# Patient Record
Sex: Female | Born: 1935 | Race: White | Hispanic: No | Smoking: Never smoker
Health system: Southern US, Community
[De-identification: ages and names within clinical notes are randomized; demographics above are authoritative.]

## PROBLEM LIST (undated history)

## (undated) DIAGNOSIS — M810 Age-related osteoporosis without current pathological fracture: Secondary | ICD-10-CM

## (undated) DIAGNOSIS — H409 Unspecified glaucoma: Secondary | ICD-10-CM

## (undated) DIAGNOSIS — I10 Essential (primary) hypertension: Secondary | ICD-10-CM

## (undated) DIAGNOSIS — F419 Anxiety disorder, unspecified: Secondary | ICD-10-CM

## (undated) DIAGNOSIS — I2510 Atherosclerotic heart disease of native coronary artery without angina pectoris: Secondary | ICD-10-CM

## (undated) DIAGNOSIS — I509 Heart failure, unspecified: Secondary | ICD-10-CM

## (undated) HISTORY — PX: CARDIAC SURGERY: SHX584

## (undated) HISTORY — PX: CORONARY ARTERY BYPASS GRAFT: SHX141

---

## 2002-11-13 ENCOUNTER — Encounter: Payer: Self-pay | Admitting: Thoracic Surgery (Cardiothoracic Vascular Surgery)

## 2002-11-13 ENCOUNTER — Inpatient Hospital Stay (HOSPITAL_COMMUNITY)
Admission: EM | Admit: 2002-11-13 | Discharge: 2002-11-20 | Payer: Self-pay | Admitting: Thoracic Surgery (Cardiothoracic Vascular Surgery)

## 2002-11-14 ENCOUNTER — Encounter: Payer: Self-pay | Admitting: Thoracic Surgery (Cardiothoracic Vascular Surgery)

## 2002-11-15 ENCOUNTER — Encounter: Payer: Self-pay | Admitting: Cardiothoracic Surgery

## 2002-11-16 ENCOUNTER — Encounter: Payer: Self-pay | Admitting: Thoracic Surgery (Cardiothoracic Vascular Surgery)

## 2002-11-18 ENCOUNTER — Encounter: Payer: Self-pay | Admitting: Cardiothoracic Surgery

## 2003-09-22 ENCOUNTER — Other Ambulatory Visit: Payer: Self-pay

## 2003-09-23 ENCOUNTER — Other Ambulatory Visit: Payer: Self-pay

## 2004-03-19 ENCOUNTER — Other Ambulatory Visit: Payer: Self-pay

## 2004-03-20 ENCOUNTER — Other Ambulatory Visit: Payer: Self-pay

## 2004-05-17 ENCOUNTER — Ambulatory Visit: Payer: Self-pay | Admitting: Specialist

## 2004-07-23 ENCOUNTER — Inpatient Hospital Stay: Payer: Self-pay | Admitting: Internal Medicine

## 2004-08-03 ENCOUNTER — Emergency Department: Payer: Self-pay | Admitting: Emergency Medicine

## 2004-10-05 ENCOUNTER — Ambulatory Visit: Payer: Self-pay

## 2005-09-10 ENCOUNTER — Other Ambulatory Visit: Payer: Self-pay

## 2005-09-10 ENCOUNTER — Emergency Department: Payer: Self-pay | Admitting: Emergency Medicine

## 2005-10-03 ENCOUNTER — Ambulatory Visit: Payer: Self-pay | Admitting: Gastroenterology

## 2005-10-18 ENCOUNTER — Ambulatory Visit: Payer: Self-pay | Admitting: Gastroenterology

## 2005-12-04 ENCOUNTER — Ambulatory Visit: Payer: Self-pay

## 2006-03-21 ENCOUNTER — Ambulatory Visit: Payer: Self-pay | Admitting: Internal Medicine

## 2006-11-16 ENCOUNTER — Emergency Department: Payer: Self-pay | Admitting: Emergency Medicine

## 2006-12-11 ENCOUNTER — Ambulatory Visit: Payer: Self-pay | Admitting: Internal Medicine

## 2007-03-27 ENCOUNTER — Ambulatory Visit: Payer: Self-pay | Admitting: Family Medicine

## 2008-01-03 ENCOUNTER — Ambulatory Visit: Payer: Self-pay | Admitting: Internal Medicine

## 2008-07-26 ENCOUNTER — Emergency Department: Payer: Self-pay | Admitting: Emergency Medicine

## 2008-08-22 ENCOUNTER — Inpatient Hospital Stay: Payer: Self-pay | Admitting: Internal Medicine

## 2008-11-06 ENCOUNTER — Ambulatory Visit: Payer: Self-pay | Admitting: Internal Medicine

## 2009-01-04 ENCOUNTER — Ambulatory Visit: Payer: Self-pay | Admitting: Internal Medicine

## 2009-05-27 ENCOUNTER — Emergency Department: Payer: Self-pay | Admitting: Emergency Medicine

## 2009-09-16 ENCOUNTER — Ambulatory Visit: Payer: Self-pay | Admitting: Gastroenterology

## 2010-01-13 ENCOUNTER — Ambulatory Visit: Payer: Self-pay | Admitting: Internal Medicine

## 2010-03-29 ENCOUNTER — Ambulatory Visit: Payer: Self-pay | Admitting: Gastroenterology

## 2010-04-18 ENCOUNTER — Ambulatory Visit: Payer: Self-pay | Admitting: Internal Medicine

## 2010-07-15 ENCOUNTER — Emergency Department: Payer: Self-pay | Admitting: Unknown Physician Specialty

## 2010-08-08 ENCOUNTER — Emergency Department: Payer: Self-pay | Admitting: Emergency Medicine

## 2010-08-09 NOTE — Assessment & Plan Note (Signed)
Summary: flu shot/jbb  Nurse Visit   Immunizations Administered:  Influenza Vaccine:    Vaccine Type: FLULAVAL    Site: left deltoid    Mfr: GlaxoSmithKline    Dose: 0.5 ml    Route: IM    Given by: Levonne Spiller EMT-P    Exp. Date: 12/08/2010    Lot #: [id number]    VIS given: 02/01/10 version given April 18, 2010.  Orders Added: 1)  Flulaval Injection 3 yrs >IM [Q2036]   Immunizations Administered:  Influenza Vaccine:    Vaccine Type: FLULAVAL    Site: left deltoid    Mfr: GlaxoSmithKline    Dose: 0.5 ml    Route: IM    Given by: Levonne Spiller EMT-P    Exp. Date: 12/08/2010    Lot #: [id number]    VIS given: 02/01/10 version given April 18, 2010.  Flu Vaccine Consent Questions:    Do you have a history of severe allergic reactions to this vaccine? no    Any prior history of allergic reactions to egg and/or gelatin? no    Do you have a sensitivity to the preservative Thimersol? no    Do you have a past history of Guillan-Barre Syndrome? no    Do you currently have an acute febrile illness? no    Have you ever had a severe reaction to latex? no    Vaccine information given and explained to patient? yes    Are you currently pregnant? no

## 2010-11-25 NOTE — Discharge Summary (Signed)
NAME:  Anita Ward, Anita Ward                          ACCOUNT NO.:  [id number]   MEDICAL RECORD NO.:  [id number]                   PATIENT TYPE:  INP   LOCATION:  2015                                 FACILITY:  [facility]   PHYSICIAN:  Anita Ward, M.D.              DATE OF BIRTH:  1920-02-10   DATE OF ADMISSION:  11/13/2002  DATE OF DISCHARGE:                           DISCHARGE SUMMARY - REFERRING   DATE OF ANTICIPATED DISCHARGE:  11/20/2002   PRIMARY ADMITTING DIAGNOSIS:  Coronary artery disease.   ADDITIONAL/DISCHARGE DIAGNOSES:  1. Severe coronary artery disease.  2. Class III progressive angina.  3. Hypertension.  4. Severe osteoporosis and osteoarthritis.  5. Gastroesophageal reflux disease.   PROCEDURES PERFORMED:  1. Off pump coronary artery bypass grafting x 2 (left internal mammary     artery to the LAD, saphenous vein graft to the diagonal).  2. Endoscopic vein harvest, right thigh.   HISTORY:  The patient is an 77 year old white female who over the past  several months has a history of chest tightness as well as shortness of  breath with exertion. Her symptoms have been occurring more frequently and  with more severity.  Since her symptoms have worsened, she saw Dr. America Brown  nurse practitioner, and was felt to warrant cardiac catheterization. This  was performed on 11/13/02, and she was found to have significant LAD disease,  specifically 90% mid stenosis of the LAD as well as a 70% stenosis of the  diagonal.  She is not felt to be a good candidate for percutaneous  intervention.  She was, therefore, transferred to [facility] under  the care of Dr. Cornelius Ward for consideration of surgical revascularization.   HOSPITAL COURSE:  She was admitted on 11/13/02. She was seen by Dr. Cornelius Ward, and  her cardiac catheterization films were evaluated. It is felt that she would  benefit from coronary artery bypass surgery. After the risks, benefits and  alternatives were explained to  the patient, she consented to proceed. She  was taken to the operating room on 11/14/02 and underwent off pump coronary  artery bypass grafting x 2 as described above.  She tolerated the procedure  well and was transferred to the SICU in stable condition.  She was extubated  shortly after surgery. She was hemodynamically stable and doing well on  postop day one. She was kept in the ICU for overnight observation.  Initially she was somewhat confused; however, her narcotic medications were  minimized, and she returned to her baseline mental status.  By late in the  day postop day two, she was being readied for transfer to the floor.  Her  blood pressure started to trend back upward late in the day postop day  three, and she was restarted on all of her home antihypertensive  medications.  At the present, she is maintaining blood pressures of between  110 and 120 systolic.  She  has also been somewhat volume overloaded and has  been diuresed back down to 4-5 pounds above her preoperative weight.   She initially was quite hypokalemic; however, her potassium has been  replaced, and this morning her potassium was 4.0. Her other labs have  remained stable. Her BUN is 9, creatinine 0.8. Hemoglobin 9.4, hematocrit  27.6. She has been ambulating in the halls per cardiac rehab phase I and is  making good progress.  She is maintained on sinus rhythm. Her surgical  incision sites are healing well. She is tolerating a regular diet and is  having normal bowel and bladder function.  It is felt that if she continues  to remain stable, she will be ready for discharge on 11/20/02. Because the  patient lives with her grandson, who is not available to care for her during  the day because of work issues, and because of her still somewhat  deconditioned state, it is felt that a short placement at a rehab facility  is indicated prior to discharge home. This has been discussed with the case  manager, and the patient  agrees to be transferred on 5/13 to an assisted  living facility.   DISCHARGE MEDICATIONS:  1. Enteric coated aspirin 325 mg daily.  2. Hyzaar 100/25 daily.  3. Norvasc 10 mg daily.  4. Toprol-XL 50 mg daily.  5. Clonidine HL 0.2 mg bid.  6. Nexium 40 mg daily.  7. Calcium 500 mg bid.  8. Vitamin E 400 IU daily.  9. Centrum silver daily.  10.      Miacalcin nasal spray as directed.  11.      Ultram 50-100 mg q.4h. prn for pain.   DISCHARGE INSTRUCTIONS:  She is to refrain from driving, heavy lifting, or  strenuous activity.  She may continue daily walking and use of her incentive  spirometry. She is asked to continue a low fat and low sodium diet.  She may  shower daily and clean her incisions with soap and water.   DISCHARGE FOLLOW UP:  She is asked to call and schedule an appointment to  see Dr. Lady Gary in 2 weeks.  She will then follow up with Dr. Cornelius Ward on Monday,  June 7th, at 1:15 p.m. She will have a chest x-ray at [facility] 1 hour prior to this appointment and is asked to bring her films to  the __________  office for his review.  Our office should be contacted  immediately if she experiences any problems such as redness, swelling or  drainage of the incision site, fever greater than 101, new onset of chest  pain or shortness of breath.     Coral Ceo, P.A.                        Anita Ward, M.D.    GC/MEDQ  D:  11/19/2002  T:  11/19/2002  Job:  [id number]   cc:   [name]  [address]  [place]  [place] [address]  Fax: [phone]   Harold Hedge, M.D.  [place]   Arnoldo Hooker, M.D.  [employer]

## 2010-11-25 NOTE — H&P (Signed)
NAME:  Anita Ward, Anita Ward                          ACCOUNT NO.:  [id number]   MEDICAL RECORD NO.:  [id number]                   PATIENT TYPE:  INP   LOCATION:  2901                                 FACILITY:  [facility]   PHYSICIAN:  Salvatore Decent. Cornelius Moras, M.D.              DATE OF BIRTH:  1919/12/23   DATE OF ADMISSION:  11/13/2002  DATE OF DISCHARGE:                                HISTORY & PHYSICAL   CHIEF COMPLAINT:  Tightness in my chest when walking.   HISTORY OF PRESENT ILLNESS:  The patient is an 77 year old white female with  a several month history of chest tightness and shortness of breath on  exertion.  The patient is usually very active for her age, walking on the  treadmill several times a week as well as being active in her church and  gardening.  However, her symptoms have worsened and become more frequent.  Now she states that the symptoms will occur with minimal activity such as  working in her garden or walking only a few steps.  She, in the past week,  developed symptoms both while at church singing in the choir and while at  the mall prompting her to stop and rest.  She also experiences numbness in  her upper extremities bilaterally. She denies diaphoresis, nausea or  vomiting.  She regularly sees the nurse practitioner at Dr. Harold Hedge  office for follow-up of her hypertension and because her symptoms have  worsened, she became concerned and sought medical attention at their office.  It was felt that her symptoms would warrant cardiac catheterization.  This  was performed today at [facility] by Dr. Arnoldo Hooker.  Her catheterization showed an ejection fraction of 60% with 80%  stenosis of the ostial right coronary artery, 60% proximal right coronary  artery, 70% mid right coronary artery, 90% mid LAD and 70% third diagonal  stenoses.  She was not felt to be a good candidate for percutaneous  intervention, therefore she was transferred to [facility]  [facility] under the care of Dr. Cornelius Moras for evaluation for possible coronary  artery bypass surgery.   PAST MEDICAL HISTORY:  1. Hypertension.  2. Severe osteoporosis and osteoarthritis.  3. Gastroesophageal reflux disease.  4. Recent bronchitis and bronchial asthma.  5. She denies history of hyperlipidemia, COPD, CVA, cancer, diabetes     mellitus.   PAST SURGICAL HISTORY:  1. Hemorrhoidectomy.  2. Appendectomy.  3. Laparoscopic cholecystectomy.  4. Partial hysterectomy.  5. Removal of left heel spur.  6. Right cataract removal.   CURRENT MEDICATIONS:  1. Hyzaar 100/25 daily.  2. Norvasc 10 mg daily.  3. Toprol XL 50 mg daily.  4. Clonidine HL 0.2 mg b.i.d.  5. Enteric coated aspirin 81 mg daily.  6. Nexium 40 mg daily.  7. Calcium 500 mg b.i.d.  8. Vitamin E 400 internal units daily.  9. Centrum Silver one daily.  10.      Flovent 110 mcg daily.  11.      Albuterol two puffs q.i.d.  12.      Miacalcin nasal spray daily.   ALLERGIES:  SULFA but the patient does not recall what type of reaction she  had.   FAMILY HISTORY:  Her father died at age 34 with congestive heart failure.  Her mother died at a young age of an unknown cause.  She had one brother who  died at age 69 of a myocardial infarction.   SOCIAL HISTORY:  She is widowed and lives in [place], [place],  with her grandson.  She has two sons.  She has been very active up to this  point as previously mentioned in the history of present illness.  She denies  any prior or current history of alcohol or tobacco use.   REVIEW OF SYSTEMS:  See history of present illness for pertinent positives  and negatives.  Also, she has history of gastroesophageal reflux disease for  which she takes Nexium. This seems to control her symptoms very well.  She  recently has had several intermittent episodes of head pain which is not  associated with other symptoms and resolved without treatment.  She also had   recent episode of bronchitis which was treated with four rounds of  antibiotics as well as several inhalers and has presently resolved.  She  denies weight loss, fevers, chills, TIA symptoms, amaurosis fugax,  dysphagia, syncope, weakness, palpitations, orthopnea, paroxysmal nocturnal  dyspnea, abdominal pain, nausea, vomiting, diarrhea, constipation,  hematuria, dysuria, nocturia, hematochezia, melena, lower extremity edema,  claudication, anxiety, depression, intolerance to heat or cold.   PHYSICAL EXAMINATION:  GENERAL APPEARANCE:  This is an elderly white female  in no acute distress.  She is very bright, alert and articulate and is a  very good historian for her age.  VITAL SIGNS:  Blood pressure 175/64, pulse 71 and regular, respiratory rate  20 and unlabored, temperature 98.3.  HEENT:  Normocephalic and atraumatic.  Pupils are equal, round, reactive to  light and accommodation.  Extraocular movements intact.  Examination of the  external ears and nose reveals no abnormality.  Oropharynx is clear and  dentition is in good repair.  NECK:  Supple without lymphadenopathy or thyromegaly.  No carotid bruits.  LUNGS:  Clear to auscultation.  CARDIOVASCULAR:  Regular rate and rhythm without murmurs, rubs, or gallops.  ABDOMEN:  Soft, nontender and nondistended with active bowel sounds in all  quadrants.  No masses or hepatosplenomegaly.  She has several well healed  surgical scars around her abdomen.  EXTREMITIES:  No clubbing, cyanosis, or edema.  Her right groin puncture  site from her catheterization today is dry without hematoma.  She has 2+  bilateral femoral pulses as well as 2+ right dorsal pedis and posterior  tibial pulses and 1+ left dorsalis pedis and posterior tibial pulses.  Saphenous veins are intact.  NEUROLOGIC:  Cranial nerves II-XII grossly intact.  She is alert and  oriented x3.  She moves all extremities without difficulty.  Gait is not tested due to her recent  catheterization.   ASSESSMENT AND PLAN:  This is an 77 year old white female with severe two  vessel coronary artery disease who is not felt to be a good candidate for  percutaneous intervention.  She is admitted in transfer from [facility].  She will continue nitroglycerin as well as her  current medications.  Dr. Cornelius Moras will see the patient this afternoon and she  will most likely be scheduled for coronary artery bypass grafting in the  morning, Nov 14, 2002.     Coral Ceo, P.A.                        Salvatore Decent. Cornelius Moras, M.D.    GC/MEDQ  D:  11/13/2002  T:  11/14/2002  Job:  [id number]   cc:   Harold Hedge, M.D.  [place], [place]   [employer]  [address]  [place]  [place] [address]  Fax: [phone]   Arnoldo Hooker, M.D.

## 2010-11-25 NOTE — Op Note (Signed)
NAME:  Anita Ward, Anita Ward                          ACCOUNT NO.:  [id number]   MEDICAL RECORD NO.:  [id number]                   PATIENT TYPE:  INP   LOCATION:  2307                                 FACILITY:  [facility]   PHYSICIAN:  Salvatore Decent. Cornelius Moras, M.D.              DATE OF BIRTH:  Aug 22, 1919   DATE OF PROCEDURE:  11/14/2002  DATE OF DISCHARGE:                                 OPERATIVE REPORT   PREOPERATIVE DIAGNOSIS:  Severe one-vessel coronary artery disease with  class III progressive angina.   POSTOPERATIVE DIAGNOSIS:  Severe one-vessel coronary artery disease with  class III progressive angina.   PROCEDURE:  Median sternotomy for off-pump coronary artery bypass grafting  x2 (left internal mammary artery to distal left anterior descending coronary  artery, saphenous vein graft to first diagonal branch), with endoscopic vein  harvest.   SURGEON:  Salvatore Decent. Cornelius Moras, M.D.   ASSISTANTS:  Salvatore Decent. Dorris Fetch, M.D., and Coral Ceo, P.A.   ANESTHESIA:  General.   BRIEF CLINICAL NOTE:  The patient is an 77 year old female followed by Dr.  Loma Ward with no previous cardiac history but a history of  hypertension.  She presents with classical symptoms of exertional angina,  which have progressed somewhat rapidly over the last few weeks.  Cardiac  catheterization performed by Dr. Gwen Pounds demonstrates severe single-vessel  coronary artery disease, including high-grade stenosis of the left anterior  descending coronary artery with coronary anatomy not favorable for  percutaneous coronary intervention.  Left ventricular function is preserved.   OPERATIVE CONSENT:  The patient and her family have been counseled at length  regarding the indications and potential benefits of coronary artery bypass  grafting.  Alternative treatment strategies have been discussed.  They  understand and accept all associated risks of surgery, including but not  limited to risk of death, stroke,  myocardial infarction, congestive heart  failure, pneumonia, respiratory failure, bleeding requiring blood  transfusion, arrhythmia, infection, and recurrent coronary artery disease.  They understand the tentative plan for an attempt at coronary bypass  grafting without use of the heart-lung machine.  They also understand that  this may or may not be technically feasible and that conventional bypass  surgery may be required.  All of their questions have been addressed.   DESCRIPTION OF PROCEDURE:  The patient is brought to the operating room on  the above-mentioned date and central monitoring is established by the  anesthesia service.  Specifically, a Swan-Ganz catheter is placed through  the right internal jugular approach.  A radial arterial line is placed.  Intravenous antibiotics are administered.  Following induction with general  endotracheal anesthesia, a Foley catheter is placed.  The patient's chest,  abdomen, both groins, and both lower extremities are prepared and draped in  a sterile manner.   A median sternotomy incision is performed and the left internal mammary  artery is dissected from the chest wall  and prepared for bypass grafting.  The left internal mammary artery is very small-caliber but otherwise good  quality and has excellent forward flow.  Simultaneously saphenous vein is  obtained from the patient's right thigh using endoscopic vein harvest  technique.  The saphenous vein is good-quality conduit.  The patient is  heparinized systemically.   The pericardium is opened.  The heart is inspected and the distal sites are  selected for coronary bypass grafting.  The saphenous vein and left internal  mammary artery conduits are both trimmed to appropriate lengths.  The  Guidant stabilization system is utilized to facilitate off-pump coronary  artery bypass grafting.  This includes suction stabilization for the apex of  the left ventricle as well as a stabilization arm  around the target vessel.  Hemostasis is achieved using a single elastic vessel loop placed around the  proximal coronary vessels for both distal anastomoses.  Distal occlusion is  not required.  Intracoronary shunts are not necessary.  The following distal  coronary anastomoses are performed:  (1) The first diagonal branch off the  left anterior descending coronary artery is grafted with a saphenous vein  graft in an end-to-side fashion.  This coronary measures 1.3 mm in diameter  and is of good quality at the site of distal bypass.  After completion of  this anastomosis, the patient developed ST segment elevation on her baseline  electrocardiogram.  The heart was then allowed to return to normal position  and the nitroglycerin drip was increased.  The proximal saphenous vein  anastomosis is performed directly to the ascending aorta under a partial  occlusion clamp.  The ST segments return to normal baseline promptly.  (2)  The distal left anterior descending coronary artery is grafted with the left  internal mammary artery in an end-to-side fashion.  This coronary measures  1.5 mm in diameter and is of good quality at the site of distal bypass.  After completion of both bypass grafts, flow is verified through each graft  using sterile ultrasonic Doppler flow probe.  Hemostasis is ascertained.  Heparin is reversed with protamine.   Epicardial pacing wires are affixed to the right ventricular outflow tract  and to the right atrial appendage.  The mediastinum and the left chest are  irrigated with saline solution containing vancomycin.  Meticulous surgical  hemostasis is ascertained.  The mediastinum and the left chest are drained  with three chest tubes placed through separate stab incisions inferiorly.  The median sternotomy is closed in a routine fashion.  The right lower  extremity incision is closed in multiple layers in routine fashion.  All skin incisions are closed with subcuticular  skin closures.   The patient tolerated the procedure well and was transported to the surgical  intensive care unit in stable condition.  There are no intraoperative  complications.  All sponge, instrument, and needle counts are verified  correct at the completion of the operation.  No blood products were  administered.                                               Salvatore Decent. Cornelius Moras, M.D.    CHO/MEDQ  D:  11/14/2002  T:  11/17/2002  Job:  [id number]   cc:   Arnoldo Hooker, M.D.   Mariel Kansky, M.D.   Anita Ward, M.D.

## 2011-01-19 ENCOUNTER — Observation Stay: Payer: Self-pay | Admitting: Internal Medicine

## 2011-05-23 ENCOUNTER — Ambulatory Visit: Payer: Self-pay | Admitting: Physician Assistant

## 2011-05-29 ENCOUNTER — Ambulatory Visit: Payer: Self-pay | Admitting: Physician Assistant

## 2011-06-15 ENCOUNTER — Emergency Department: Payer: Self-pay | Admitting: Emergency Medicine

## 2011-06-16 ENCOUNTER — Emergency Department: Payer: Self-pay | Admitting: Emergency Medicine

## 2011-06-20 ENCOUNTER — Ambulatory Visit: Payer: Self-pay | Admitting: Orthopedic Surgery

## 2011-09-12 LAB — CBC
HGB: 11.4 g/dL — ABNORMAL LOW (ref 12.0–16.0)
MCH: 27.8 pg (ref 26.0–34.0)
MCHC: 34.5 g/dL (ref 32.0–36.0)
MCV: 80 fL (ref 80–100)
Platelet: 280 10*3/uL (ref 150–440)

## 2011-09-12 LAB — COMPREHENSIVE METABOLIC PANEL
Albumin: 3.1 g/dL — ABNORMAL LOW (ref 3.4–5.0)
Alkaline Phosphatase: 59 U/L (ref 50–136)
Anion Gap: 14 (ref 7–16)
BUN: 7 mg/dL (ref 7–18)
Bilirubin,Total: 0.6 mg/dL (ref 0.2–1.0)
Chloride: 82 mmol/L — ABNORMAL LOW (ref 98–107)
Co2: 25 mmol/L (ref 21–32)
Creatinine: 0.62 mg/dL (ref 0.60–1.30)
EGFR (African American): >60
EGFR (Non-African Amer.): >60
Glucose: 109 mg/dL — ABNORMAL HIGH (ref 65–99)
Osmolality: 243 (ref 275–301)
Potassium: 2.9 mmol/L — ABNORMAL LOW (ref 3.5–5.1)
SGPT (ALT): 17 U/L
Sodium: 121 mmol/L — ABNORMAL LOW (ref 136–145)
Total Protein: 7.1 g/dL (ref 6.4–8.2)

## 2011-09-12 LAB — TROPONIN I: Troponin-I: <0.02 ng/mL

## 2011-09-13 ENCOUNTER — Inpatient Hospital Stay: Payer: Self-pay | Admitting: Internal Medicine

## 2011-09-14 LAB — CBC WITH DIFFERENTIAL/PLATELET
Basophil #: 0 10*3/uL (ref 0.0–0.1)
Eosinophil #: 0 10*3/uL (ref 0.0–0.7)
Lymphocyte %: 10 %
MCH: 27.6 pg (ref 26.0–34.0)
MCHC: 33.5 g/dL (ref 32.0–36.0)
Neutrophil %: 81.6 %
Platelet: 298 10*3/uL (ref 150–440)
RDW: 13.5 % (ref 11.5–14.5)
WBC: 14.8 10*3/uL — ABNORMAL HIGH (ref 3.6–11.0)

## 2011-09-14 LAB — BASIC METABOLIC PANEL
Anion Gap: 8 (ref 7–16)
Calcium, Total: 8.5 mg/dL (ref 8.5–10.1)
Co2: 27 mmol/L (ref 21–32)
Creatinine: 0.71 mg/dL (ref 0.60–1.30)
EGFR (African American): >60
Potassium: 3.2 mmol/L — ABNORMAL LOW (ref 3.5–5.1)

## 2011-09-15 LAB — BASIC METABOLIC PANEL
Anion Gap: 10 (ref 7–16)
BUN: 8 mg/dL (ref 7–18)
Calcium, Total: 8.5 mg/dL (ref 8.5–10.1)
Creatinine: 0.63 mg/dL (ref 0.60–1.30)
EGFR (African American): >60
EGFR (Non-African Amer.): >60
Glucose: 100 mg/dL — ABNORMAL HIGH (ref 65–99)
Sodium: 136 mmol/L (ref 136–145)

## 2011-09-15 LAB — CBC WITH DIFFERENTIAL/PLATELET
Eosinophil #: 0 10*3/uL (ref 0.0–0.7)
Eosinophil %: 0 %
HCT: 33.4 % — ABNORMAL LOW (ref 35.0–47.0)
Lymphocyte #: 1.2 10*3/uL (ref 1.0–3.6)
MCH: 27.2 pg (ref 26.0–34.0)
MCV: 82 fL (ref 80–100)
Monocyte #: 1 10*3/uL — ABNORMAL HIGH (ref 0.0–0.7)
Monocyte %: 7.1 %
Neutrophil #: 12.3 10*3/uL — ABNORMAL HIGH (ref 1.4–6.5)
Neutrophil %: 84.2 %
Platelet: 289 10*3/uL (ref 150–440)
RBC: 4.08 10*6/uL (ref 3.80–5.20)
RDW: 13.4 % (ref 11.5–14.5)
WBC: 14.6 10*3/uL — ABNORMAL HIGH (ref 3.6–11.0)

## 2011-09-16 LAB — BASIC METABOLIC PANEL
BUN: 9 mg/dL (ref 7–18)
Calcium, Total: 8.5 mg/dL (ref 8.5–10.1)
Chloride: 99 mmol/L (ref 98–107)
Co2: 27 mmol/L (ref 21–32)
Potassium: 4.4 mmol/L (ref 3.5–5.1)
Sodium: 136 mmol/L (ref 136–145)

## 2011-09-16 LAB — CBC WITH DIFFERENTIAL/PLATELET
Basophil %: 0 %
Eosinophil %: 0.1 %
Lymphocyte #: 1.5 10*3/uL (ref 1.0–3.6)
MCH: 27.4 pg (ref 26.0–34.0)
MCHC: 33.6 g/dL (ref 32.0–36.0)
MCV: 82 fL (ref 80–100)
Monocyte #: 1.1 10*3/uL — ABNORMAL HIGH (ref 0.0–0.7)
Monocyte %: 8.3 %
Neutrophil #: 10.6 10*3/uL — ABNORMAL HIGH (ref 1.4–6.5)
Neutrophil %: 80.3 %
RBC: 4.13 10*6/uL (ref 3.80–5.20)

## 2011-09-17 LAB — BASIC METABOLIC PANEL
Calcium, Total: 9.1 mg/dL (ref 8.5–10.1)
Chloride: 97 mmol/L — ABNORMAL LOW (ref 98–107)
Co2: 28 mmol/L (ref 21–32)
Creatinine: 0.74 mg/dL (ref 0.60–1.30)
Glucose: 87 mg/dL (ref 65–99)
Osmolality: 272 (ref 275–301)
Potassium: 3.9 mmol/L (ref 3.5–5.1)
Sodium: 137 mmol/L (ref 136–145)

## 2011-09-17 LAB — CBC WITH DIFFERENTIAL/PLATELET
Basophil %: 0.2 %
Eosinophil #: 0 10*3/uL (ref 0.0–0.7)
Eosinophil %: 0.2 %
HCT: 35.9 % (ref 35.0–47.0)
HGB: 12 g/dL (ref 12.0–16.0)
Lymphocyte %: 14.3 %
Monocyte %: 9.3 %
Neutrophil %: 76 %
Platelet: 335 10*3/uL (ref 150–440)
RBC: 4.42 10*6/uL (ref 3.80–5.20)
WBC: 12.1 10*3/uL — ABNORMAL HIGH (ref 3.6–11.0)

## 2011-09-18 LAB — CULTURE, BLOOD (SINGLE)

## 2012-05-08 ENCOUNTER — Inpatient Hospital Stay: Payer: Self-pay | Admitting: Internal Medicine

## 2012-05-08 LAB — COMPREHENSIVE METABOLIC PANEL
Alkaline Phosphatase: 83 U/L (ref 50–136)
Calcium, Total: 9.1 mg/dL (ref 8.5–10.1)
Chloride: 71 mmol/L — ABNORMAL LOW (ref 98–107)
Co2: 31 mmol/L (ref 21–32)
EGFR (African American): >60
EGFR (Non-African Amer.): >60
SGOT(AST): 31 U/L (ref 15–37)
SGPT (ALT): 23 U/L (ref 12–78)

## 2012-05-08 LAB — BASIC METABOLIC PANEL
Anion Gap: 11 (ref 7–16)
BUN: 9 mg/dL (ref 7–18)
Co2: 31 mmol/L (ref 21–32)
Creatinine: 0.79 mg/dL (ref 0.60–1.30)
EGFR (African American): >60
EGFR (Non-African Amer.): >60
Glucose: 116 mg/dL — ABNORMAL HIGH (ref 65–99)

## 2012-05-08 LAB — URINALYSIS, COMPLETE
Leukocyte Esterase: NEGATIVE
Nitrite: NEGATIVE
Ph: 7 (ref 4.5–8.0)
Protein: 100
RBC,UR: 1 /HPF (ref 0–5)
WBC UR: 2 /HPF (ref 0–5)

## 2012-05-08 LAB — LIPASE, BLOOD: Lipase: 121 U/L (ref 73–393)

## 2012-05-08 LAB — SODIUM
Sodium: 116 mmol/L — CL (ref 136–145)
Sodium: 117 mmol/L — CL (ref 136–145)

## 2012-05-08 LAB — DIFFERENTIAL
Basophil #: 0.1 10*3/uL (ref 0.0–0.1)
Basophil %: 0.6 %
Lymphocyte #: 2.5 10*3/uL (ref 1.0–3.6)
Monocyte %: 6.4 %
Neutrophil #: 18 10*3/uL — ABNORMAL HIGH (ref 1.4–6.5)

## 2012-05-08 LAB — CBC
HCT: 37.9 % (ref 35.0–47.0)
MCH: 27.7 pg (ref 26.0–34.0)
MCHC: 36 g/dL (ref 32.0–36.0)
MCV: 77 fL — ABNORMAL LOW (ref 80–100)
Platelet: 428 10*3/uL (ref 150–440)
RDW: 14 % (ref 11.5–14.5)
WBC: 22.1 10*3/uL — ABNORMAL HIGH (ref 3.6–11.0)

## 2012-05-08 LAB — OSMOLALITY: Osmolality: 236 mOsm/kg — CL (ref 280–301)

## 2012-05-09 LAB — SODIUM
Sodium: 124 mmol/L — ABNORMAL LOW (ref 136–145)
Sodium: 121 mmol/L — ABNORMAL LOW (ref 136–145)

## 2012-05-09 LAB — CBC WITH DIFFERENTIAL/PLATELET
Basophil %: 0.2 %
Eosinophil %: 0 %
HCT: 36.1 % (ref 35.0–47.0)
HGB: 12.9 g/dL (ref 12.0–16.0)
Lymphocyte #: 1.6 10*3/uL (ref 1.0–3.6)
MCH: 27.1 pg (ref 26.0–34.0)
MCV: 76 fL — ABNORMAL LOW (ref 80–100)
Monocyte #: 1.7 x10 3/mm — ABNORMAL HIGH (ref 0.2–0.9)
Neutrophil #: 18.4 10*3/uL — ABNORMAL HIGH (ref 1.4–6.5)
RBC: 4.77 10*6/uL (ref 3.80–5.20)

## 2012-05-09 LAB — BASIC METABOLIC PANEL
BUN: 6 mg/dL — ABNORMAL LOW (ref 7–18)
Calcium, Total: 7.9 mg/dL — ABNORMAL LOW (ref 8.5–10.1)
Chloride: 79 mmol/L — ABNORMAL LOW (ref 98–107)
Creatinine: 0.66 mg/dL (ref 0.60–1.30)
EGFR (Non-African Amer.): >60
Glucose: 117 mg/dL — ABNORMAL HIGH (ref 65–99)
Potassium: 2.7 mmol/L — ABNORMAL LOW (ref 3.5–5.1)
Sodium: 121 mmol/L — ABNORMAL LOW (ref 136–145)

## 2012-05-09 LAB — POTASSIUM: Potassium: 3.4 mmol/L — ABNORMAL LOW (ref 3.5–5.1)

## 2012-05-09 LAB — PROTEIN ELECTROPHORESIS(ARMC)

## 2012-05-10 LAB — BASIC METABOLIC PANEL
Anion Gap: 9 (ref 7–16)
Calcium, Total: 8.5 mg/dL (ref 8.5–10.1)
Chloride: 90 mmol/L — ABNORMAL LOW (ref 98–107)
Co2: 28 mmol/L (ref 21–32)
Creatinine: 0.74 mg/dL (ref 0.60–1.30)
EGFR (African American): >60
Potassium: 3.4 mmol/L — ABNORMAL LOW (ref 3.5–5.1)
Sodium: 127 mmol/L — ABNORMAL LOW (ref 136–145)

## 2012-05-10 LAB — SODIUM
Sodium: 134 mmol/L — ABNORMAL LOW (ref 136–145)
Sodium: 130 mmol/L — ABNORMAL LOW (ref 136–145)

## 2012-05-10 LAB — UR PROT ELECTROPHORESIS, URINE RANDOM

## 2012-05-10 LAB — CBC WITH DIFFERENTIAL/PLATELET
Basophil #: 0.1 10*3/uL (ref 0.0–0.1)
Basophil %: 0.4 %
Eosinophil #: 0 10*3/uL (ref 0.0–0.7)
Lymphocyte #: 1.6 10*3/uL (ref 1.0–3.6)
Lymphocyte %: 9.5 %
MCH: 27.4 pg (ref 26.0–34.0)
MCHC: 35.8 g/dL (ref 32.0–36.0)
MCV: 77 fL — ABNORMAL LOW (ref 80–100)
Monocyte #: 1.9 x10 3/mm — ABNORMAL HIGH (ref 0.2–0.9)
Neutrophil %: 78.6 %
RDW: 14 % (ref 11.5–14.5)

## 2012-05-10 LAB — URINE CULTURE

## 2012-05-11 LAB — BASIC METABOLIC PANEL
Anion Gap: 8 (ref 7–16)
BUN: 8 mg/dL (ref 7–18)
Calcium, Total: 8.4 mg/dL — ABNORMAL LOW (ref 8.5–10.1)
EGFR (African American): >60
EGFR (Non-African Amer.): >60
Glucose: 86 mg/dL (ref 65–99)
Osmolality: 266 (ref 275–301)

## 2012-05-11 LAB — CBC WITH DIFFERENTIAL/PLATELET
Basophil #: 0.1 10*3/uL (ref 0.0–0.1)
Eosinophil %: 0.2 %
HCT: 32.4 % — ABNORMAL LOW (ref 35.0–47.0)
Lymphocyte #: 1.7 10*3/uL (ref 1.0–3.6)
MCH: 27.6 pg (ref 26.0–34.0)
MCHC: 35.1 g/dL (ref 32.0–36.0)
Monocyte #: 1.4 x10 3/mm — ABNORMAL HIGH (ref 0.2–0.9)
Neutrophil #: 8.8 10*3/uL — ABNORMAL HIGH (ref 1.4–6.5)
Neutrophil %: 74 %
Platelet: 334 10*3/uL (ref 150–440)
WBC: 11.9 10*3/uL — ABNORMAL HIGH (ref 3.6–11.0)

## 2012-05-12 LAB — CBC WITH DIFFERENTIAL/PLATELET
Basophil #: 0.1 10*3/uL (ref 0.0–0.1)
Eosinophil #: 0.1 10*3/uL (ref 0.0–0.7)
Eosinophil %: 0.9 %
Lymphocyte %: 13.4 %
MCH: 26.5 pg (ref 26.0–34.0)
MCHC: 33.1 g/dL (ref 32.0–36.0)
MCV: 80 fL (ref 80–100)
Monocyte #: 1.3 x10 3/mm — ABNORMAL HIGH (ref 0.2–0.9)
Neutrophil %: 74.2 %
Platelet: 345 10*3/uL (ref 150–440)
RBC: 4.22 10*6/uL (ref 3.80–5.20)
RDW: 14.3 % (ref 11.5–14.5)

## 2012-05-12 LAB — BASIC METABOLIC PANEL
Anion Gap: 8 (ref 7–16)
BUN: 10 mg/dL (ref 7–18)
Calcium, Total: 8.6 mg/dL (ref 8.5–10.1)
EGFR (African American): >60
EGFR (Non-African Amer.): >60
Glucose: 86 mg/dL (ref 65–99)
Osmolality: 270 (ref 275–301)
Potassium: 4.3 mmol/L (ref 3.5–5.1)

## 2012-05-13 LAB — CULTURE, BLOOD (SINGLE)

## 2012-05-13 LAB — WBC: WBC: 11.6 10*3/uL — ABNORMAL HIGH (ref 3.6–11.0)

## 2012-05-19 ENCOUNTER — Emergency Department: Payer: Self-pay | Admitting: Emergency Medicine

## 2012-05-19 ENCOUNTER — Inpatient Hospital Stay: Payer: Self-pay | Admitting: Internal Medicine

## 2012-05-19 LAB — TROPONIN I: Troponin-I: <0.02 ng/mL

## 2012-05-19 LAB — URINALYSIS, COMPLETE
Bilirubin,UR: NEGATIVE
Bilirubin,UR: NEGATIVE
Blood: NEGATIVE
Blood: NEGATIVE
Glucose,UR: NEGATIVE mg/dL (ref 0–75)
Ketone: NEGATIVE
Leukocyte Esterase: NEGATIVE
Nitrite: NEGATIVE
Ph: 8 (ref 4.5–8.0)
Ph: 8 (ref 4.5–8.0)
Protein: NEGATIVE
RBC,UR: <1 /HPF (ref 0–5)
RBC,UR: 2 /HPF (ref 0–5)
Specific Gravity: 1.006 (ref 1.003–1.030)
Squamous Epithelial: 2
Squamous Epithelial: 11
WBC UR: <1 /HPF (ref 0–5)

## 2012-05-19 LAB — COMPREHENSIVE METABOLIC PANEL
Alkaline Phosphatase: 78 U/L (ref 50–136)
Anion Gap: 9 (ref 7–16)
BUN: 8 mg/dL (ref 7–18)
Bilirubin,Total: 0.8 mg/dL (ref 0.2–1.0)
Calcium, Total: 9.1 mg/dL (ref 8.5–10.1)
Co2: 29 mmol/L (ref 21–32)
Creatinine: 0.6 mg/dL (ref 0.60–1.30)
EGFR (African American): >60
EGFR (Non-African Amer.): >60
Glucose: 118 mg/dL — ABNORMAL HIGH (ref 65–99)
Osmolality: 219 (ref 275–301)
Potassium: 2.1 mmol/L — CL (ref 3.5–5.1)
Sodium: 108 mmol/L — CL (ref 136–145)
Total Protein: 7.4 g/dL (ref 6.4–8.2)

## 2012-05-19 LAB — BASIC METABOLIC PANEL
Anion Gap: 9 (ref 7–16)
Chloride: 78 mmol/L — ABNORMAL LOW (ref 98–107)
Co2: 28 mmol/L (ref 21–32)
Creatinine: 0.63 mg/dL (ref 0.60–1.30)
EGFR (Non-African Amer.): >60
Glucose: 123 mg/dL — ABNORMAL HIGH (ref 65–99)
Osmolality: 232 (ref 275–301)

## 2012-05-19 LAB — CBC
HGB: 12.1 g/dL (ref 12.0–16.0)
MCH: 27.2 pg (ref 26.0–34.0)
MCHC: 36.1 g/dL — ABNORMAL HIGH (ref 32.0–36.0)
MCV: 76 fL — ABNORMAL LOW (ref 80–100)
RDW: 13.6 % (ref 11.5–14.5)

## 2012-05-20 LAB — URINALYSIS, COMPLETE
Bilirubin,UR: NEGATIVE
Blood: NEGATIVE
Glucose,UR: NEGATIVE mg/dL (ref 0–75)
Ketone: NEGATIVE
Leukocyte Esterase: NEGATIVE
Nitrite: NEGATIVE
Ph: 8 (ref 4.5–8.0)
Squamous Epithelial: <1
WBC UR: <1 /HPF (ref 0–5)

## 2012-05-20 LAB — CBC WITH DIFFERENTIAL/PLATELET
Basophil #: 0 10*3/uL (ref 0.0–0.1)
Eosinophil %: 0.1 %
HCT: 31.7 % — ABNORMAL LOW (ref 35.0–47.0)
HGB: 11.2 g/dL — ABNORMAL LOW (ref 12.0–16.0)
Lymphocyte %: 9.5 %
Monocyte %: 12.3 %
Neutrophil #: 9.6 10*3/uL — ABNORMAL HIGH (ref 1.4–6.5)
Platelet: 374 10*3/uL (ref 150–440)
RBC: 4.11 10*6/uL (ref 3.80–5.20)
RDW: 13.8 % (ref 11.5–14.5)
WBC: 12.3 10*3/uL — ABNORMAL HIGH (ref 3.6–11.0)

## 2012-05-20 LAB — BASIC METABOLIC PANEL
BUN: 9 mg/dL (ref 7–18)
Calcium, Total: 8.7 mg/dL (ref 8.5–10.1)
Chloride: 92 mmol/L — ABNORMAL LOW (ref 98–107)
Creatinine: 0.71 mg/dL (ref 0.60–1.30)
EGFR (Non-African Amer.): >60
Potassium: 3.1 mmol/L — ABNORMAL LOW (ref 3.5–5.1)
Sodium: 128 mmol/L — ABNORMAL LOW (ref 136–145)

## 2012-05-21 LAB — BASIC METABOLIC PANEL
Anion Gap: 7 (ref 7–16)
Calcium, Total: 8.3 mg/dL — ABNORMAL LOW (ref 8.5–10.1)
Chloride: 105 mmol/L (ref 98–107)
Co2: 24 mmol/L (ref 21–32)
EGFR (African American): >60
Osmolality: 271 (ref 275–301)
Potassium: 4.1 mmol/L (ref 3.5–5.1)

## 2012-05-21 LAB — CBC WITH DIFFERENTIAL/PLATELET
Basophil #: 0 10*3/uL (ref 0.0–0.1)
Basophil %: 0.4 %
Eosinophil #: 0.1 10*3/uL (ref 0.0–0.7)
HGB: 10.1 g/dL — ABNORMAL LOW (ref 12.0–16.0)
Lymphocyte #: 1.3 10*3/uL (ref 1.0–3.6)
Lymphocyte %: 13.2 %
MCH: 28.1 pg (ref 26.0–34.0)
MCHC: 35.5 g/dL (ref 32.0–36.0)
Monocyte #: 1 x10 3/mm — ABNORMAL HIGH (ref 0.2–0.9)
Monocyte %: 10.5 %
Neutrophil #: 7.2 10*3/uL — ABNORMAL HIGH (ref 1.4–6.5)
Platelet: 362 10*3/uL (ref 150–440)
RDW: 13.9 % (ref 11.5–14.5)
WBC: 9.5 10*3/uL (ref 3.6–11.0)

## 2012-05-26 ENCOUNTER — Emergency Department: Payer: Self-pay | Admitting: Emergency Medicine

## 2012-05-26 LAB — CK TOTAL AND CKMB (NOT AT ARMC): CK, Total: 63 U/L (ref 21–215)

## 2012-05-26 LAB — CBC
HGB: 11.7 g/dL — ABNORMAL LOW (ref 12.0–16.0)
MCH: 27.8 pg (ref 26.0–34.0)
MCHC: 36 g/dL (ref 32.0–36.0)
Platelet: 417 10*3/uL (ref 150–440)
RBC: 4.2 10*6/uL (ref 3.80–5.20)
RDW: 13.7 % (ref 11.5–14.5)
WBC: 11 10*3/uL (ref 3.6–11.0)

## 2012-05-26 LAB — TROPONIN I: Troponin-I: <0.02 ng/mL

## 2012-05-26 LAB — URINALYSIS, COMPLETE
Blood: NEGATIVE
Glucose,UR: NEGATIVE mg/dL (ref 0–75)
Ketone: NEGATIVE
Leukocyte Esterase: NEGATIVE
Nitrite: NEGATIVE
Ph: 8 (ref 4.5–8.0)
Protein: NEGATIVE
RBC,UR: <1 /HPF (ref 0–5)
Specific Gravity: 1.001 (ref 1.003–1.030)
WBC UR: 1 /HPF (ref 0–5)

## 2012-05-26 LAB — COMPREHENSIVE METABOLIC PANEL
Albumin: 3.5 g/dL (ref 3.4–5.0)
Anion Gap: 4 — ABNORMAL LOW (ref 7–16)
BUN: 11 mg/dL (ref 7–18)
Bilirubin,Total: 0.4 mg/dL (ref 0.2–1.0)
Calcium, Total: 9.1 mg/dL (ref 8.5–10.1)
Chloride: 80 mmol/L — ABNORMAL LOW (ref 98–107)
Co2: 34 mmol/L — ABNORMAL HIGH (ref 21–32)
EGFR (African American): >60
Glucose: 99 mg/dL (ref 65–99)
Potassium: 3 mmol/L — ABNORMAL LOW (ref 3.5–5.1)
SGOT(AST): 22 U/L (ref 15–37)
SGPT (ALT): 18 U/L (ref 12–78)
Sodium: 118 mmol/L — CL (ref 136–145)

## 2012-05-28 LAB — URINE CULTURE

## 2012-09-30 ENCOUNTER — Ambulatory Visit: Payer: Self-pay | Admitting: Internal Medicine

## 2012-10-14 ENCOUNTER — Ambulatory Visit: Payer: Self-pay | Admitting: Internal Medicine

## 2012-11-12 ENCOUNTER — Emergency Department: Payer: Self-pay | Admitting: Emergency Medicine

## 2012-11-12 LAB — CBC WITH DIFFERENTIAL/PLATELET
Basophil %: 1 %
Eosinophil #: 0.3 10*3/uL (ref 0.0–0.7)
Eosinophil %: 2.3 %
HCT: 36.1 % (ref 35.0–47.0)
HGB: 12.3 g/dL (ref 12.0–16.0)
Lymphocyte %: 17.2 %
Monocyte %: 9.8 %
Neutrophil %: 69.7 %

## 2012-11-13 LAB — URINALYSIS, COMPLETE
Blood: NEGATIVE
Glucose,UR: NEGATIVE mg/dL (ref 0–75)
Ketone: NEGATIVE
Leukocyte Esterase: NEGATIVE
Ph: 8 (ref 4.5–8.0)
RBC,UR: <1 /HPF (ref 0–5)
Specific Gravity: 1.004 (ref 1.003–1.030)

## 2012-11-13 LAB — BASIC METABOLIC PANEL
BUN: 14 mg/dL (ref 7–18)
Chloride: 97 mmol/L — ABNORMAL LOW (ref 98–107)
Co2: 30 mmol/L (ref 21–32)
Creatinine: 0.93 mg/dL (ref 0.60–1.30)
EGFR (African American): >60
EGFR (Non-African Amer.): 54 — ABNORMAL LOW
Glucose: 136 mg/dL — ABNORMAL HIGH (ref 65–99)
Osmolality: 269 (ref 275–301)
Potassium: 3.1 mmol/L — ABNORMAL LOW (ref 3.5–5.1)
Sodium: 133 mmol/L — ABNORMAL LOW (ref 136–145)

## 2013-04-06 ENCOUNTER — Emergency Department: Payer: Self-pay | Admitting: Emergency Medicine

## 2013-04-06 LAB — URINALYSIS, COMPLETE
Bilirubin,UR: NEGATIVE
Blood: NEGATIVE
Glucose,UR: NEGATIVE mg/dL (ref 0–75)
Leukocyte Esterase: NEGATIVE
Nitrite: NEGATIVE
Ph: 8 (ref 4.5–8.0)
Protein: NEGATIVE
RBC,UR: NONE SEEN /HPF (ref 0–5)
Specific Gravity: 1.001 (ref 1.003–1.030)
Squamous Epithelial: 1
WBC UR: NONE SEEN /HPF (ref 0–5)

## 2013-04-06 LAB — BASIC METABOLIC PANEL
Anion Gap: 5 — ABNORMAL LOW (ref 7–16)
Anion Gap: 5 — ABNORMAL LOW (ref 7–16)
BUN: 9 mg/dL (ref 7–18)
BUN: 8 mg/dL (ref 7–18)
Chloride: 102 mmol/L (ref 98–107)
Co2: 29 mmol/L (ref 21–32)
Creatinine: 0.93 mg/dL (ref 0.60–1.30)
EGFR (African American): >60
EGFR (African American): >60
EGFR (Non-African Amer.): 54 — ABNORMAL LOW
Glucose: 107 mg/dL — ABNORMAL HIGH (ref 65–99)
Glucose: 180 mg/dL — ABNORMAL HIGH (ref 65–99)
Osmolality: 275 (ref 275–301)
Potassium: 3.1 mmol/L — ABNORMAL LOW (ref 3.5–5.1)
Potassium: 3.2 mmol/L — ABNORMAL LOW (ref 3.5–5.1)
Sodium: 126 mmol/L — ABNORMAL LOW (ref 136–145)

## 2013-04-06 LAB — CBC
HGB: 12.5 g/dL (ref 12.0–16.0)
MCHC: 35.1 g/dL (ref 32.0–36.0)
MCV: 77 fL — ABNORMAL LOW (ref 80–100)
Platelet: 372 10*3/uL (ref 150–440)
RBC: 4.65 10*6/uL (ref 3.80–5.20)
WBC: 14 10*3/uL — ABNORMAL HIGH (ref 3.6–11.0)

## 2013-04-06 LAB — SEDIMENTATION RATE: Erythrocyte Sed Rate: 11 mm/hr (ref 0–30)

## 2013-11-20 ENCOUNTER — Ambulatory Visit: Payer: Self-pay | Admitting: Internal Medicine

## 2014-01-27 ENCOUNTER — Emergency Department: Payer: Self-pay | Admitting: Emergency Medicine

## 2014-01-27 LAB — BASIC METABOLIC PANEL
ANION GAP: 8 (ref 7–16)
BUN: 14 mg/dL (ref 7–18)
CALCIUM: 9.2 mg/dL (ref 8.5–10.1)
CO2: 31 mmol/L (ref 21–32)
Chloride: 97 mmol/L — ABNORMAL LOW (ref 98–107)
Creatinine: 1.12 mg/dL (ref 0.60–1.30)
EGFR (African American): 49 — ABNORMAL LOW
GFR CALC NON AF AMER: 43 — AB
Glucose: 103 mg/dL — ABNORMAL HIGH (ref 65–99)
Osmolality: 273 (ref 275–301)
Potassium: 3.4 mmol/L — ABNORMAL LOW (ref 3.5–5.1)
Sodium: 136 mmol/L (ref 136–145)

## 2014-01-27 LAB — CBC
HCT: 41.8 % (ref 35.0–47.0)
HGB: 14 g/dL (ref 12.0–16.0)
MCH: 26.8 pg (ref 26.0–34.0)
MCHC: 33.5 g/dL (ref 32.0–36.0)
MCV: 80 fL (ref 80–100)
Platelet: 319 10*3/uL (ref 150–440)
RBC: 5.23 10*6/uL — AB (ref 3.80–5.20)
RDW: 13.5 % (ref 11.5–14.5)
WBC: 9.1 10*3/uL (ref 3.6–11.0)

## 2014-01-27 LAB — TROPONIN I
Troponin-I: <0.02 ng/mL
Troponin-I: <0.02 ng/mL

## 2014-09-08 ENCOUNTER — Emergency Department: Payer: Self-pay | Admitting: Emergency Medicine

## 2014-10-27 NOTE — Discharge Summary (Signed)
PATIENT NAME:  Anita Ward, Anita Ward MR#:  [id number] DATE OF BIRTH:  08-04-1919  DATE OF ADMISSION:  05/19/2012 DATE OF DISCHARGE:  05/21/2012  DIAGNOSES AT TIME OF DISCHARGE:  1. Nausea, vomiting with hyponatremia and hypokalemia.  2. Hypertension.   3. History of coronary artery disease status post coronary artery bypass graft.  4. History of gastroesophageal reflux disease.   CHIEF COMPLAINT: Altered mental status, nausea, vomiting, and dehydration.   HISTORY OF PRESENT ILLNESS: Anita Ward is a 81 year old female with a history of coronary artery disease status post bypass surgery, history of hypertension, gastroesophageal reflux disease, chronic obstructive pulmonary disease who presented to the ER with nausea, vomiting and also altered mental status. Patient had been recently treated for klebsiella urinary tract infection with a course of Levaquin but reportedly started to become symptomatic with dysuria and was subsequently prescribed Cipro. Patient thinks she may have had an allergic reaction to Cipro and appeared to be confused and disoriented. Patient was also noted to have low sodium of 108, potassium 2.1 with chloride 70.   PAST MEDICAL HISTORY:  1. Chronic obstructive pulmonary disease.  2. Glaucoma.  3. Osteoporosis.  4. Hypertension.  5. Gastroesophageal reflux disease.  6. Osteoarthritis.  7. Coronary artery disease. She has had previous coronary artery bypass graft. 8. Cholecystectomy. 9. Hysterectomy.  10. Appendectomy.  11. Foot repair for heel spurs. 12. Basal cell carcinoma resection. 13. Right eye cataract surgery. 14. Right knee arthroscopic surgery.   PHYSICAL EXAMINATION: VITAL SIGNS: Temperature 97.7, pulse 73, respirations 18, blood pressure 209/87, pulse oximetry 96% on room air. HEENT: Normocephalic, atraumatic. Oropharynx was clear. NECK: Supple. No JVD. HEART: S1, S2. 3/6 systolic ejection murmur. ABDOMEN: Soft, nontender. EXTREMITIES: No edema. NEUROLOGICAL:  Patient was intact. She was alert and oriented. No obvious focal deficits.   LABORATORY, DIAGNOSTIC AND RADIOLOGICAL DATA: Urinalysis showed 3+ bacteria, few WBCs, negative leukocyte esterase. WBC count was elevated 18.6, hemoglobin 12.1, hematocrit 33.5, platelets 419, sodium 108, potassium 2.1, chloride 70, bicarbonate 29, BUN 8, creatinine 0.6, glucose 118, calcium 9.1. CK 59, MB 1.8. Chest x-ray showed no acute process. Urine cultures grew 15,000 coagulase-negative staph aureus and 3000 CFU aerobic gram-positive rods. There was no growth.   HOSPITAL COURSE: Patient was started on IV Levaquin. She was also started on intravenous fluids. During her stay in the hospital her sodium gradually corrected and improved to 136 on day of discharge. Potassium also improved to 4.1, white cell count improved to 9.5 from 18.6. She did have an episode of dizziness that subsequently resolved. Patient's blood pressure is also stable and she was discharged home on the following medications.   DISCHARGE MEDICATIONS:  1. Losartan 100 mg once a day.  2. Amlodipine 5 mg once a day.  3. Clonidine 0.2 mg b.i.d.  4. Hydralazine 100 mg b.i.d.  5. HCTZ 25 mg a day.  6. Isosorbide mononitrate 30 mg once a day.  7. Metoprolol ER 50 mg once a day.  8. Pantoprazole 40 mg once a day.  9. Xanax 0.5 mg daily p.r.n.  10. Advised to complete a course of Levaquin 250 mg p.o. daily for four days.   FOLLOW UP: Patient has been advised to follow up with me, Dr. Marcello Fennel, in 1 to 2 weeks' time. Home health care was also arranged for her. Patient will have a repeat urinalysis in a couple of weeks to make sure her infection is completely cleared up.   Total time spent in discharge and co ordination of  care: 35 minutes ____________________________ Barbette Reichmann, MD vh:cms D: 05/21/2012 12:59:00 ET T: 05/22/2012 09:57:12 ET  JOB#: [id number] Barbette Reichmann MD ELECTRONICALLY SIGNED 06/10/2012 13:49

## 2014-10-27 NOTE — H&P (Signed)
PATIENT NAME:  Anita Ward, Anita Ward MR#:  [id number] DATE OF BIRTH:  December 22, 1919  DATE OF ADMISSION:  05/19/2012  ADMITTING PHYSICIAN: Enid Baas, MD   PRIMARY MD: Dr. Barbette Reichmann   CHIEF COMPLAINT: Altered mental status, nausea and dehydration.    HISTORY OF PRESENT ILLNESS: Anita Ward is a 81 year old pleasant Caucasian female with past medical history significant for coronary artery disease status post bypass graft surgery, hypertension, gastroesophageal reflux disease, and COPD who was brought from home with the above-mentioned complaints. The patient was recently in the hospital from 05/08/2012 to 05/13/2012 for Klebsiella urinary tract infection, dehydration with multiple electrolyte abnormalities and was just discharged home with home health less than a week ago. According to grandson who was giving most of the history at bedside, the patient was doing well and since Friday, which is two days ago from now, the patient has been complaining of dysuria so the home health nurse called the on-call physician and she was given a prescription for Cipro. The patient has taken two doses of Cipro and started having severe nausea, couldn't eat or drink very much. She remembers she has an allergic reaction to Cipro and that's why she is having these symptoms. She came to the ER, she was monitored for allergic reaction to Cipro and was sent home this morning from last night. She was still dehydrated and confused and disoriented so was brought back to the ER again this afternoon and found to have a sodium of 108 with potassium of 2.1 and chloride of 70 so she is being admitted. After IV fluids at 200 mL per hour, her sodium improved to 115 and her mental status is much better at this time.   PAST MEDICAL HISTORY:  1. Chronic obstructive pulmonary disease.  2. Glaucoma.  3. Osteoporosis.  4. Hypertension.  5. Gastroesophageal reflux disease.  6. Osteoarthritis.  7. Coronary artery disease.    PAST  SURGICAL HISTORY:  1. Coronary artery bypass graft surgery in May 2004.  2. Cholecystectomy.  3. Hysterectomy.  4. Appendectomy.  5. Foot repair for heel spurs.  6. Basal cell carcinoma resection.  7. Right eye cataract surgery.  8. Right knee arthroscopic surgery.   ALLERGIES TO MEDICATIONS: Ciprofloxacin causes severe GI distress, but tolerated Levaquin fine last admission, penicillin, sulfa, Zithromax.  CURRENT HOME MEDICATIONS:  1. Losartan 100 mg p.o. daily.  2. Protonix 40 mg p.o. daily.  3. Xanax 0.25 mg daily p.r.n.  4. Imdur 30 mg daily.  5. Norvasc 5 mg p.o. daily.  6. Metoprolol succinate 50 mg p.o. daily.  7. HCTZ 25 mg daily.  8. Lasix 40 mg p.o. daily. 9. Clonidine 0.2 mg p.o. b.i.d.  10. Hydralazine 100 mg p.o. b.i.d.  11. Levaquin 250 mg p.o. daily which she just has finished yesterday.   SOCIAL HISTORY: Lives at home with her grandson. No smoking or alcohol use.   FAMILY HISTORY: Mom died young of unknown reason. Father had congestive heart failure and brother with MI in 25's.  REVIEW OF SYSTEMS: CONSTITUTIONAL: Positive for fatigue. No fever or weakness. EYES: No blurred vision, double vision. Positive for glaucoma and cataracts. ENT: Moderate hearing loss. No tinnitus, ear pain, epistaxis, or discharge. RESPIRATORY: No cough, wheeze, hemoptysis, or COPD. CARDIOVASCULAR: No chest pain, orthopnea, edema, arrhythmia, palpitations, or syncope. GI: Positive for nausea. No vomiting, diarrhea, abdominal pain, hematemesis, or rectal bleed. GU: Positive for dysuria. No hematuria, renal calculus, or frequency. ENDOCRINE: No polyuria, nocturia, thyroid problems, heat or cold intolerance.  HEMATOLOGY: No anemia, easy bruising or bleeding. SKIN: No acne, rash, or lesions. MUSCULOSKELETAL: Generalized body aches. Positive for arthritis. NEUROLOGIC: No numbness, weakness, CVA, TIA, or seizures. PSYCHOLOGIC: No anxiety, insomnia, or depression.   PHYSICAL EXAMINATION:   VITAL  SIGNS: Temperature 97.7 degrees Fahrenheit, pulse 73, respirations 18, blood pressure 209/87, pulse oximetry 96% on room air.   GENERAL: Well built, well nourished female lying in bed not in any acute distress.   HEENT: Normocephalic, atraumatic. Pupils equal, round, reacting to light. Anicteric sclerae. Extraocular movements intact. Oropharynx clear without erythema, mass, or exudates.   NECK: Supple. No thyromegaly, JVD, or carotid bruits. No lymphadenopathy.   LUNGS: Moving air bilaterally. No wheeze or crackles. No use of accessory muscles for breathing.   CARDIOVASCULAR: S1, S2 regular rate and rhythm. 3/6 systolic murmur. No rubs or gallops.   ABDOMEN: Soft, obese, nontender, nondistended. No hepatosplenomegaly. Normal bowel sounds.   EXTREMITIES: No pedal edema. No clubbing or cyanosis. 1+ dorsalis pedis pulses palpable bilaterally.   SKIN: No acne, rash, or lesions.   LYMPH: There is no cervical lymphadenopathy.   NEUROLOGIC: Cranial nerves intact. No focal motor or sensory deficits.   PSYCHOLOGICAL: The patient is awake, alert, oriented x3.   LAB DATA: Urinalysis showing 3+ bacteria, few WBCs, negative leukocyte esterase or nitrates. WBC is elevated to 18.6, hemoglobin 12.1, hematocrit 33.5, platelets platelet count 419, sodium 108, potassium 2.1, chloride 70, bicarb 29, BUN 8, creatinine 0.6, glucose 118, calcium 9.1. CK 59. CK-MB 1.8. Troponin less than 0.02.  Chest x-ray is showing no acute disease of the chest.  Urinalysis from last night is negative for any infection. WBC last night was 11.6.   ASSESSMENT AND PLAN: This is a 81 year old female with past medical history significant for hypertension, coronary artery disease status post CABG, GERD, and COPD who was recently in the hospital last week for UTI and electrolyte abnormalities who comes in with similar complaints and couldn't tolerate Cipro or Levaquin started as outpatient two days ago.  1. Nausea and vomiting  likely from Cipro or Levaquin which was started as an outpatient and couldn't tolerate it. Discontinue fluoroquinolones at this time. The patient still has dysuria. Urinalysis from last night is negative, now showing 3+ bacteria without significant WBCs but white count is elevated. Could be just contaminated sample. Recent culture showing Klebsiella UTI sensitive to most of the antibiotics. Reculture and will start on broad-spectrum antibiotics and follow WBC count and clinical improvement. 2. Hyponatremia, hypochloremia, and hypokalemia. Could be GI losses and also from diuretics, HCTZ and Lasix. Will discontinue HCTZ and not start her on HCTZ at discharge. Will hold Lasix at this time. Continue IV fluids and potassium supplements. Recent admission last week with similar numbers and improved with fluids. Will hold off on Nephrology consultation for now. She was just seen by nephrologist last week.  3. Hypertension. Continue home medications except HCTZ and Lasix.  4. CAD status coronary artery bypass surgery in 2004. She is asymptomatic. Continue to monitor. 5. Gastroesophageal reflux disease. Currently on Protonix b.i.d. and Maalox p.r.n.   CODE STATUS: FULL CODE.  TIME SPENT ON ADMISSION: 50 minutes.   ____________________________ Enid Baas, MD rk:drc D: 05/19/2012 19:51:35 ET T: 05/20/2012 06:58:04 ET JOB#: [id number]  cc: Enid Baas, MD, <Dictator> Barbette Reichmann, MD  Enid Baas MD ELECTRONICALLY SIGNED 05/20/2012 17:09

## 2014-10-27 NOTE — Discharge Summary (Signed)
PATIENT NAME:  Anita Ward, Anita Ward MR#:  [id number] DATE OF BIRTH:  [birth year]  DATE OF ADMISSION:  05/08/2012 DATE OF DISCHARGE:  05/13/2012  DISCHARGE DIAGNOSES:  1. Nausea and vomiting with hyponatremia.  2. Klebsiella urinary tract infection.  3. Hypertension.  4. Weakness.   CHIEF COMPLAINT: Nausea and vomiting associated with abdominal distention.   HISTORY OF PRESENT ILLNESS: Anita Ward is a 81 year old female with a past medical history significant coronary artery disease, gastroesophageal reflux disease, osteoarthritis, hypertension, osteoporosis, chronic obstructive pulmonary disease, presents to the ER with intractable nausea and vomiting. The patient had been prescribed Zofran and continued to be symptomatic. She also was noted to be hyponatremic with a serum sodium of 116 and a low potassium of 2.5. The patient states that she has been drinking a lot of fluids. She denies any dizziness. No chest pain. No abdominal pain, no fevers or chills.   PAST MEDICAL HISTORY: Significant for: 1. Osteoporosis.  2. Glaucoma.  3. Chronic obstructive pulmonary disease.  4. Hypertension.  5. Gastroesophageal reflux disease.  6. Osteoarthritis.  7. Coronary artery disease.   PAST SURGICAL HISTORY:  1. Cholecystectomy.  2. Hysterectomy.  3. Appendix removal.  4. Foot repair.  5. Basal cell carcinoma.  6. CABG. 7. Cataract surgery.   PHYSICAL EXAMINATION: GENERAL: The patient was in some distress. HEENT: Normocephalic, atraumatic. Pupils are equal and reactive to light. NECK: Supple. LUNGS: Clear to auscultation. HEART: S1, S2. No rubs or murmurs. Regular rhythm. ABDOMEN: Soft, distended. Bowel sounds were heard and hyperactive. No tenderness. No masses felt. EXTREMITIES: No edema. NEUROLOGIC: Nonfocal.   LABORATORY, DIAGNOSTIC AND RADIOLOGICAL DATA: Glucose 134, sodium 116, potassium 2.5, chloride 71, CO2 31. GFR greater than 60.0. Anion gap 14, serum osmolality is low at 326. Lipase was  elevated at 121. Troponin less than 0.02. WBCs 22,900, hemoglobin 13.7, hematocrit 37.9, platelets 428. Initial urinalysis was negative, but urine cultures done in the clinic the day prior to admission grew Klebsiella pneumonia greater than 100,000 colony-forming units that was sensitive to ceftriaxone, cefuroxime, and also Levaquin with resistance to nitrofurantoin.   HOSPITAL COURSE: During his stay in the hospital, the patient initially received intravenous fluids. Her blood pressure was elevated and she was restarted back on her home blood pressure medications. She was also seen by a nephrologist, Dr. Cherylann Ratel.  Her serum sodium gradually improved. Her white cell count, however, continued to remain elevated. She was therefore started on Levaquin and gradually her symptoms resolved. She reportedly had an allergy to Cipro, but she tolerated Levaquin well. She was ambulated and discharged in stable condition on the following medications.   DISCHARGE MEDICATIONS:  1. Hydralazine 100 mg b.i.d.  2. Losartan 100 mg once a day.  3. Pantoprazole 40 mg once a day.  4. Xanax 0.25 mg daily p.r.n.  5. Imdur 30 mg once a day.  6. Amlodipine 5 mg once a day.  7. Metoprolol succinate 50 mg once a day.  8. HCTZ 25 mg a day.  9. Furosemide 40 mg once a day.  10. Clonidine 0.2 mg p.o. b.i.d.  11. Levaquin 250 mg p.o. daily for five more days.     FOLLOWUP: The patient has been advised to follow up with me, Dr. Marcello Fennel, in 1 to 2 weeks' time. She was advised to call back if there are any questions or concerns  Total time spent in discharge and coordination: 45 minutes ____________________________ Barbette Reichmann, MD vh:cbb D: 05/14/2012 08:17:32 ET T: 05/14/2012 11:30:01 ET JOB#:  [id number]  cc: Barbette Reichmann, MD, <Dictator> Barbette Reichmann MD ELECTRONICALLY SIGNED 06/10/2012 13:46

## 2014-10-27 NOTE — H&P (Signed)
PATIENT NAME:  Anita Ward, Anita Ward MR#:  [id number] DATE OF BIRTH:  13-Apr-1920  DATE OF ADMISSION:  05/08/2012  PRIMARY CARE PHYSICIAN: Dr. Barbette Reichmann from [facility]   CHIEF COMPLAINT: Nausea and vomiting associated with abdominal distention.  HISTORY OF PRESENT ILLNESS: Patient is a 81 year old Caucasian female with past medical history of chronic obstructive pulmonary disease, coronary artery disease, gastroesophageal reflux disease, osteoarthritis, hypertension, osteoporosis and glaucoma is presenting to the ER with chief complaint of intractable nausea and vomiting for one day. Patient was recently admitted to the hospitalist service in March 2013 for increasing shortness of breath and productive cough and she was diagnosed with acute exacerbation of chronic obstructive pulmonary disease associated with pneumonia at that time. 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 During this admission patient is experiencing nausea for one day. This is associate with vomiting. Patient denies any hematemesis. Was seen by her primary care physician who did some lab work and was sent home with Zofran. Patient is still having nausea and vomiting x1. Denies any blood. Denies any abdominal pain but discomfort and a lot of belching which made her come to the hospital. Patient had a regular bowel movement one day ago and reporting that she is passing gas. Denies any diarrhea. No fevers, dizziness or loss of consciousness. Abdominal x-ray was done in the ER which showed nonspecific bowel gas pattern and also fecal matter. No air-fluid levels are noticed. Patient's sodium was found to be low at 116 with low potassium of 2.5. She has received one bag of normal saline bolus and then eventually patient was started on half-normal saline with 40 of KCl. Eventually these fluids are changed to  normal saline with 20 KCl at 75 mL/h. Patient is belching often recently. Regarding weight she has been drinking lots and lots of fluids and keeping her well hydrated. Denies any dizziness or loss of consciousness. Denies any chest pain or abdominal pain. Patient's grandson is next to her during my examination. Patient's white count is elevated at 22,100.   PAST MEDICAL HISTORY: 1. Chronic obstructive pulmonary disease. 2. Glaucoma. 3. Osteoporosis. 4. Hypertension. 5. Gastroesophageal reflux disease. 6. Osteoarthritis. 7. Coronary artery disease.   PAST SURGICAL HISTORY: 1. Cholecystectomy. 2. Hysterectomy.  3. Appendix removal. 4. Foot repair. 5. Basal cell carcinoma. 6. Coronary artery bypass graft in May 2004.  7. Cataract.  ALLERGIES: Ciprofloxacin, penicillin, sulfa, Zithromax.   HOME MEDICATIONS: 1. Xanax 0.5 mg p.o. tablet half in a.m. 2. Toprol XL 50 mg half tablet a.m. and p.m.  3. Protonix 40 mg p.o. once daily a.m.  4. Losartan 100 mg 1 tablet p.o. once a day.  5. Klonopin dose unknown takes orally. 6. Imdur 30 mg 1 tablet p.o. once a day. 7. Hydrochlorothiazide 25 mg half tablet a day.  8. Hydralazine 100 mg 1 tablet p.o. b.i.d.  9. Clonidine 0.3 mg 1 tablet p.o. b.i.d. 10. Amlodipine 5 mg 1 tablet p.o. once a day.   PSYCHOSOCIAL HISTORY: Patient is a widow. She lives with her grandson currently. Denies any history of smoking or alcohol or illicit drug usage.   FAMILY HISTORY: Mother died at a young age of unknown cause. Father died with congestive heart failure. Brother died of MI at age 58.   REVIEW OF SYSTEMS: CONSTITUTIONAL: Denies fever but complaining of fatigue and weakness. EYES: No blurry vision or double vision. No glaucoma. EARS:  No tinnitus or ear pain. RESPIRATORY: Positive for cough but denies any shortness of breath. CARDIOVASCULAR: No chest pain, orthopnea, edema. GASTROINTESTINAL: Positive nausea, vomiting. Denies diarrhea. Denies abdominal pain.  GENITOURINARY: No dysuria, hematuria. ENDOCRINE: No polyuria, nocturia. HEMATOLOGY: No anemia or easy bruising. SKIN: No acne, rashes. MUSCULOSKELETAL: Positive for arthritis. Denies any back pain, neck pain. PSYCH: No anxiety or depression. All other systems are reviewed and negative.   PHYSICAL EXAMINATION: VITAL SIGNS: Temperature 99.1, pulse 75, respiratory rate 18, blood pressure 160/66, pulse ox 94% to 96%.  GENERAL APPEARANCE: Moderately built and moderately nourished, not in acute distress, looks tired.  HEENT: Normocephalic, atraumatic. Pupils are equally reacting to light and accommodation. No conjunctival injection or scleral icterus.   NECK: Supple. No jugular venous distention.   LUNGS: Clear to auscultation bilaterally.   CARDIOVASCULAR: S1, S2 normal, regular rate and rhythm.  GASTROINTESTINAL: Soft, distended but bowel sounds are positive, hyperactive. No tenderness. No masses felt. No rebound tenderness.  NEUROLOGICAL: Awake, alert, oriented x3. Answering questions appropriately. Cranial nerves II through XII are grossly intact. Reflexes are 2+.   EXTREMITIES: No edema, cyanosis, no clubbing.   LABORATORY, DIAGNOSTIC AND RADIOLOGICAL DATA: Glucose 134, sodium 116, potassium 2.5, chloride 71, CO2 31, GFR greater than 60, anion gap 14, serum osmolality is low at 326, lipase elevated 121, troponin less than 0.02, WBC 22,900, hemoglobin 13.7, hematocrit 37.9, platelet count 428,000. Urinalysis  negative.   ASSESSMENT:  1. Severe hyponatremia with sodium of 119. 2. Hypokalemia. 3. Ileus.  4. Acute gastritis.  5. Hypertension, uncontrolled.   PLAN: Will make her n.p.o. Will give her gentle hydration with IV fluids normal saline with 20 KCl at 75 mL/h and repeat chem 8 at 8:00 a.m. Will repeat chem 8 levels every four hours x4. Patient needs IV fluids as she is having intractable nausea and vomiting. She seems to be hypo-osmolar from drinking a lot of fluids. If the sodium  levels are significantly improving not more than 1 mg/h will continue the current IV fluids but if there is no improvement and if there is further drop in the sodium level will decrease the fluid amount and follow up with nephrology. Will provide GI prophylaxis with Protonix. Will hold all her home medications. DVT prophylaxis will be provided with Lovenox sub-Q. Patient is FULL CODE. The diagnosis and plan of care was discussed in detail with the patient and her son at bedside. They both voiced understanding of the plan. Patient will be turned over to [facility] at 6:00 a.m. today.   TOTAL TIME SPENT ON ADMISSION: 60 minutes for the history and physical and coordination of care.   ____________________________ Ramonita Lab, MD ag:cms D: 05/08/2012 06:05:46 ET T: 05/08/2012 07:42:33 ET JOB#: [id number]  cc: Ramonita Lab, MD, <Dictator> Barbette Reichmann, MD Ramonita Lab MD ELECTRONICALLY SIGNED 05/09/2012 6:36

## 2014-10-27 NOTE — Consult Note (Signed)
PATIENT NAME:  Anita Ward, RUUD MR#:  [id number] DATE OF BIRTH:  [birth year]  DATE OF CONSULTATION:  05/08/2012  REFERRING PHYSICIAN:  Dr. Barbette Reichmann  CONSULTING PHYSICIAN:  Shina Wass Lizabeth Leyden, MD  REASON FOR CONSULTATION: Severe hyponatremia.   HISTORY OF PRESENT ILLNESS: The patient is a pleasant 81 year old Caucasian female with past medical history of chronic obstructive pulmonary disease, glaucoma, osteoporosis, hypertension, gastroesophageal reflux disease, coronary artery disease, multiple surgeries who presented yesterday to [facility] with ongoing nausea and vomiting. She reports that she developed nausea and vomiting for the better part of one day. Her p.o. intake was diminished at that time. Despite nausea and vomiting she states that she still took hydrochlorothiazide. She states that she vomited numerous episodes but is unclear of the exact number but at least 3 to 4 episodes. Her presenting sodium was quite low at 116. Serum sodium this a.m. was still low at 113. She is currently receiving IV fluid hydration with normal saline supplemented with 20 mEq of potassium chloride per liter. She is receiving this IV fluid at 75 mL/h. Serum potassium was also quite low at 3.0. The patient also appears to have leukocytosis which has been present for quite some time. Urinalysis showed specific gravity quite low at 1.008. Urine protein was 100 mg/dL. Patient's renal function is currently normal.   PAST MEDICAL HISTORY:  1. Chronic obstructive pulmonary disease.  2. Hypertension.  3. Glaucoma.  4. Osteoporosis.  5. Gastroesophageal reflux disease.  6. Osteoarthritis.  7. Coronary artery disease.   PAST SURGICAL HISTORY:  1. Cholecystectomy.  2. Hysterectomy.  3. Appendectomy.  4. History of basal cell carcinoma removal.  5. Coronary artery bypass graft in May 2004.   ALLERGIES: Ciprofloxacin, penicillin, sulfa and Zithromax.   CURRENT INPATIENT MEDICATIONS:   1. 0.9 normal saline supplemented with potassium chloride 20 mEq/L at 75 mL/h. 2. Lovenox 30 mg daily. 3. Metoprolol 5 mg IV every six hours p.r.n.  4. Zofran 4 mg IV every four hours p.r.n.  5. Protonix 40 mg IV daily.  6. Potassium chloride 40 mEq IV x1.   SOCIAL HISTORY: Patient is widowed. Her grandson lives with her. Denies tobacco, alcohol, or illicit drug use.   FAMILY HISTORY: Father died secondary to congestive heart failure. Her brother died secondary to myocardial infarction. Patient's mother is also deceased, however, it is unclear as to what caused her death.    REVIEW OF SYSTEMS: CONSTITUTIONAL: Denies fevers, chills, weight loss. EYES: Denies diplopia, blurry vision. HEENT: Denies headaches, hearing loss. Denies epistaxis. CARDIOVASCULAR: Denies chest pain, palpitations, PND. RESPIRATORY: Does have some chronic shortness of breath and has history of chronic obstructive pulmonary disease. GASTROINTESTINAL: Positive for nausea and vomiting. No hematemesis or diarrhea. GENITOURINARY: Denies frequency, urgency, dysuria. MUSCULOSKELETAL: Denies joint pain, swelling, or redness. INTEGUMENTARY: Denies skin rashes or lesions. NEUROLOGIC: Denies focal weakness or numbness but does have some generalized weakness now. PSYCHIATRIC: Denies depression, bipolar disorder. ENDOCRINE: Denies polyuria, polydipsia, or polyphagia. HEMATOLOGIC/LYMPHATIC: Denies easy bruisability, bleeding, or swollen lymph nodes. ALLERGY/IMMUNOLOGIC: Denies seasonal allergies or history of immunodeficiency.   PHYSICAL EXAMINATION:  VITAL SIGNS: Temperature 98.1, pulse 77, respirations 18, blood pressure 164/90.   GENERAL: Well-developed, well-nourished Caucasian female who appears her stated age, currently no acute distress.   HEENT: Normocephalic, atraumatic. Extraocular movements are intact. Pupils are equal, round, reactive to light. No scleral icterus. Conjunctivae are pink. No epistaxis noted. Gross hearing intact.  Oral mucosa are actually found to be moist.   NECK:  Supple and without JVD or lymphadenopathy.   LUNGS: Clear to auscultation bilaterally with normal respiratory effort.   CARDIOVASCULAR: S1, S2 regular rate and rhythm. No murmurs, rubs, or gallops appreciated.   ABDOMEN: Soft, nontender, nondistended. Bowel sounds positive. No rebound or guarding. No gross organomegaly appreciated.   EXTREMITIES: No clubbing, cyanosis, or edema noted.   NEUROLOGIC: Patient is alert and oriented to time, person, and place. Strength is 5/5 in both upper and lower extremities.   GENITOURINARY: No suprapubic tenderness noted at this time.   SKIN: Warm and dry. Good skin turgor noted.   PSYCHIATRIC: Patient with appropriate affect and appears to have some insight into her current illness.   LABORATORY, DIAGNOSTIC AND RADIOLOGICAL DATA: Sodium 113, potassium 3, chloride 71, CO2 31, BUN 9, creatinine 0.79, glucose 116. LFTs are found to be normal. CBC shows WBC 22.2, hemoglobin 13.7, hematocrit 37, platelets 428. Urinalysis shows specific gravity quite low at 1.008. Urine protein was 100 mg/dL. 2-D echocardiogram from 09/13/2011 shows ejection fraction normal at 55%. Left atrium was moderately dilated. Right atrium is moderately dilated.   IMPRESSION: This is a 81 year old Caucasian female with past medical history of chronic obstructive pulmonary disease, hypertension, glaucoma, osteoporosis, gastroesophageal reflux disease, osteoarthritis, coronary artery disease status post coronary artery bypass graft in May 2004 who presented to [facility] with one day history of nausea, vomiting and now found to have severe hyponatremia.   PROBLEM LIST:  1. Severe hyponatremia with presenting sodium of 116.  2. Hypokalemia.  3. Hypertension.  4. Leukocytosis.   PLAN: The patient presents with an interesting case. She did have nausea and vomiting with several episodes of vomiting yesterday. Patient  is unclear as to the exact number but it appears that there were at least 3 to 4 in number. In addition to this the patient was taking hydrochlorothiazide and she states that she took this yesterday as well. The combination of nausea and vomiting as well as taking hydrochlorothiazide could potentially account for the patient's hyponatremia as well as hypokalemia. We will proceed with further work-up including TSH, cortisol levels, SPEP, UPEP, serum and urine osmolality. I agree with treating any potential underlying volume depletion with 0.9 normal saline supplemented with potassium chloride solution. We will obtain serum sodium every six hours. Goal serum sodium for the next 24 hours would be approximately 120. If this approach does not work we may need to consider different therapies including the possibility of conivaptan. Patient also appears to have leukocytosis and there appears to be some chronicity for this. If this persists would consider referral to oncology.   I would like to thank Dr. Marcello Fennel for this kind referral. Further plan as patient progresses.  ____________________________ Lennox Pippins, MD mnl:cms D: 05/08/2012 12:36:12 ET T: 05/08/2012 12:49:40 ET JOB#: [id number]  cc: Lennox Pippins, MD, <Dictator>  Lennox Pippins MD ELECTRONICALLY SIGNED 05/21/2012 10:46

## 2014-11-01 ENCOUNTER — Emergency Department
Admit: 2014-11-01 | Disposition: A | Discharge status: Home / Self Care | Payer: Self-pay | Admitting: Emergency Medicine

## 2014-11-01 NOTE — H&P (Signed)
PATIENT NAME:  Anita Ward, Anita Ward MR#:  [id number] DATE OF BIRTH:  1920/06/12  DATE OF ADMISSION:  09/13/2011  PRIMARY CARE PHYSICIAN: Dr. Marcello Fennel   CHIEF COMPLAINT: Increasing shortness of breath and productive cough   HISTORY OF PRESENT ILLNESS: Anita Ward is a very pleasant 81 year old Caucasian female comes to the Emergency Room after she started noticing increasing coughing spells associated with shortness of breath for past couple of days. Patient was initially started on Zithromax, however, she felt some dizzy spell and was discontinued. She was started on Levaquin after she saw Dr. Marcello Fennel on 03/04 in the clinic. She took two doses of Levaquin. She continued to have shortness of breath along with coughing spell along with increased fatigability and weakness. She came to the Emergency Room where she is hemodynamically stable. Chest x-ray shows possible right lower lobe pneumonia. CT of the chest is pending. She received a dose of Levaquin and nebulizer treatment. She feels a little better. She is being admitted for further evaluation and management.   PAST MEDICAL HISTORY:  1. Chronic obstructive pulmonary disease/asthma.  2. Glaucoma.  3. Osteoporosis.  4. Hypertension.  5. Gastroesophageal reflux disease.  6. Osteoarthritis.  7. Coronary artery disease.   PAST SURGICAL HISTORY:  1. Cataract.  2. Gallbladder removal.  3. Hysterectomy.  4. Appendix removal. 5. Foot surgery.  6. Basal cell carcinoma.  7. CABG in May 2004.   MEDICATIONS:  1. Pantoprazole in the morning.  2. Isosorbide p.o. daily. I do not have the strength of isosorbide.  3. Toprol 100 mg half in the morning and 1 tablet in the evening.  4. Losartan 100 mg in the morning.  5. Hydrochlorothiazide 25 mg daily.  6. Hydralazine 100 mg b.i.d.  7. Clonidine 0.2 mg b.i.d.  8. Caltrate 2 a day.  9. Magnesium t.i.d.  10. Alprazolam 1/2 tablet a day.  11. Furosemide again strength not known, 1 in the morning.   ALLERGIES:  Cardura, Cipro, Fosamax, lisinopril, Norvasc, penicillin, sulfa/Bactrim.   FAMILY HISTORY: Mother died at a young age of unknown cause. Father died with congestive heart failure. Brother died of MI at age 46.   SOCIAL HISTORY: She is a widow with two sons. Currently takes care of her son with schizophrenia.   REVIEW OF SYSTEMS: CONSTITUTIONAL: No fever. Positive for fatigue and weakness. EYES: No blurred or double vision. ENT: No tinnitus or ear pain. RESPIRATORY: Positive for cough, shortness of breath. CARDIOVASCULAR: No chest pain, orthopnea, edema. GASTROINTESTINAL: No nausea, vomiting, diarrhea, abdominal pain. GENITOURINARY: No dysuria, hematuria. ENDOCRINE: No polyuria, nocturia. HEMATOLOGY: No anemia or easy bruising SKIN: No acne, rash. MUSCULOSKELETAL: Positive for arthritis. NEUROLOGIC: No cerebrovascular accident, transient ischemic attack. PSYCH: No anxiety or depression. All other systems reviewed and negative.   PHYSICAL EXAMINATION:  GENERAL: Patient is awake. She is frail, thin, appears weak, not in acute distress with mild shortness of breath.   VITAL SIGNS: Afebrile, pulse 61, blood pressure 187/79, sats 98% on 2 liters.   HEENT: Atraumatic, normocephalic. Pupils are equal, round, and reactive to light and accommodation. Extraocular movements intact. Oral mucosa is moist.   NECK: Supple. No JVD. No carotid bruit.   RESPIRATORY: Decreased breath sounds in the bases. No audible rales or rhonchi heard. No use of accessory muscles.   CARDIOVASCULAR: Both the heart sounds are normal. Rate, rhythm is regular. PMI not lateralized. Chest nontender.   EXTREMITIES: Good pedal pulses, good femoral pulses. No lower extremity edema.   ABDOMEN: Soft, benign, nontender.  No organomegaly. Positive bowel sounds.   NEUROLOGIC: Grossly intact cranial nerves II through XII. No motor or sensory deficit. Patient is globally weak.   SKIN: Warm and dry.   LABORATORY, DIAGNOSTIC AND  RADIOLOGICAL DATA: Chest x-ray suspected right perihilar/lower lobe pneumonia. White count 11.8, hemoglobin and hematocrit 11.4 and 33.1, platelet count 280, glucose 109, sodium 121, potassium 2.9, chloride 82, bicarbonate 25, bilirubin 0.6. LFTs within normal limits. Troponin 0.02.   CT of the chest pending.   EKG shows sinus rhythm with first-degree AV block with PACs.   ASSESSMENT: 81 year old Anita Ward with:  1. Progressive increasing cough with productive phlegm and shortness of breath, likely chronic obstructive pulmonary disease flare with possible right lower lobe pneumonia. Patient will continue Levaquin that was just started two days ago by Dr. Marcello Fennel. Short course of p.o. steroid, nebulizers and inhalers. Follow up CT of the chest.  2. Hyponatremia/hypokalemia, suspected SIADH versus due to hydrochlorothiazide. Will continue IV fluids at low rate.  3. Coronary artery disease.   4. Hypertension.   PLAN:  1. Admit patient to medical floor. 2 grams sodium diet.  2. IV fluids for hydration.  3. Replace potassium p.o. Continue all home medications. Will start on prednisone taper.  4. Continue Levaquin.  5. Follow blood cultures, sputum cultures.  6. CT of the chest has been ordered.  7. Care management for discharge planning.  8. Physical therapy to see patient when patient able to get around.  9. Further work-up according to patient's clinical course. Hospital admission plan was discussed with patient. No family members present.  10. CODE STATUS: Patient is a FULL CODE. Will defer addressing CODE STATUS to Dr. Marcello Fennel. In the meantime will keep patient FULL CODE.   TIME SPENT: 50 minutes.  ____________________________ Wylie Hail Allena Katz, MD sap:cms D: 09/13/2011 02:48:49 ET T: 09/13/2011 07:02:35 ET JOB#: [id number]  cc: Tifini Reeder A. Allena Katz, MD, <Dictator> Barbette Reichmann, MD Willow Ora MD ELECTRONICALLY SIGNED 09/21/2011 16:04

## 2014-11-01 NOTE — Discharge Summary (Signed)
PATIENT NAME:  Anita Ward, Anita Ward MR#:  [id number] DATE OF BIRTH:  03/10/1920  DATE OF ADMISSION:  09/13/2011 DATE OF DISCHARGE:  09/18/2011  DISCHARGE DIAGNOSES:  1. Acute respiratory failure secondary to pneumonia.  2. Chronic obstructive pulmonary disease.  3. Electrolyte imbalance with hyponatremia and hypokalemia.  4. Coronary artery disease.  5. Malignant hypertension.   CHIEF COMPLAINT: Increasing shortness of breath, productive cough.   HISTORY OF PRESENT ILLNESS: Anita Ward is an 81 year old female who presented to the ER complaining of a cough associated with shortness of breath for about two days duration. The patient had taken most recently a course of Zithromax but had to stop it because of dizziness. Subsequently, she was started on Levaquin when she saw me in the clinic on March 4th; and after two days of Levaquin the patient continued to be symptomatic, associated with persistent cough, fatigability, weakness. The patient's chest x-ray showed possible right lower lobe pneumonia.   PAST MEDICAL HISTORY: Significant for: 1. Osteoporosis. 2. Hypertension.  3. Gastroesophageal reflux disease.  4. Osteoarthritis.  5. Coronary artery disease. 6. Chronic obstructive pulmonary disease/asthma.   PAST SURGICAL HISTORY: Significant for: 1. Cataract surgery. 2. Gallbladder surgery. 3. Hysterectomy.  4. Appendectomy.  5. Foot surgery. 6. Basal cell carcinoma. 7. Coronary artery bypass graft in 2004.   PHYSICAL EXAMINATION: GENERAL: She appeared weak, in mild distress, with shortness of breath on exertion. HEENT: Normocephalic, atraumatic. Extraocular movements were intact. NECK: Supple. No JVD. LUNGS: Bilateral rhonchi present. ABDOMEN: Soft, nontender. HEART: S1, S2. EXTREMITIES: No edema. NEUROLOGICAL: Nonfocal.   LABORATORY, DIAGNOSTIC AND RADIOLOGICAL DATA:  WBC count 11.8, hemoglobin 11.4, hematocrit 33.1, platelets 280. Glucose 109, sodium 129, potassium 2.9, chloride 82,  bicarbonate 25, bilirubin 0.6.  LFTs were normal.  Troponin 0.02.   HOSPITAL COURSE: The patient was admitted to [facility] and received antibiotic therapy with Levaquin. She was also started on a prednisone taper. A CT of the chest with contrast showed evidence of mild intralobular septal thickening which could represent a competent of pulmonary edema. There were very small bilateral effusions as well as adjacent compressive atelectasis. There was some evidence of cardiomegaly, and coronary artery calcification, and a coronary artery stent. A repeat chest x-ray showed interstitial infiltrate, and infectious inflammatory infiltrate was of diagnostic consideration as well. There was diffuse thickening of the interstitial markings and an area of increased density projected in the right costophrenic region. During her stay in the hospital, the patient had episodic elevation of blood pressure, sometimes systolic blood pressure reaching as high as 220. She received IV hydralazine, and her clonidine dose was also increased. She was started on Norvasc. Her electrolyte imbalance was also corrected with the sodium improving to 137, potassium is 3.9. The patient was ambulated. She was overall symptomatically better from a respiratory standpoint, and with increase in her blood pressure medications her blood pressure also started to improve. The patient was discharged home in stable condition on the following medications.   DISCHARGE MEDICATIONS:  1. Clonidine 0.3 mg p.o. b.i.d.  2. Amlodipine 5 mg a day.  3. Imdur 30 mg a day.  4. Hydralazine 100 mg p.o. b.i.d.  5. Losartan 100 mg once a day. 6. Pantoprazole 40 mg once a day.  7. Toprol-XL 50 mg, 1/2 tablet in the a.m. and 1 tablet in the p.m.  8. Xanax 0.5 mg, 1/2 tablet in the a.m. p.r.n.  9. Klonopin 1 mg p.o. daily.  ADDITIONAL MEDICATIONS: The patient, in addition, was  advised: 1. HCTZ 12.5 mg a day.  2. Levaquin 250 mg p.o. daily  for 3 more days.   FOLLOWUP: The patient was advised a low-sodium diet.   FOLLOWUP: Follow up in the clinic with me, Dr. Marcello Fennel, in 1 to 2 weeks' time   ____________________________ Barbette Reichmann, MD vh:cbb D: 09/19/2011 16:25:19 ET T: 09/20/2011 13:23:14 ET JOB#: [id number]  cc: Barbette Reichmann, MD, <Dictator> Barbette Reichmann MD ELECTRONICALLY SIGNED 09/22/2011 13:03

## 2015-01-07 ENCOUNTER — Encounter: Payer: Self-pay | Admitting: Emergency Medicine

## 2015-01-07 ENCOUNTER — Other Ambulatory Visit: Payer: Self-pay

## 2015-01-07 ENCOUNTER — Emergency Department
Admission: EM | Admit: 2015-01-07 | Discharge: 2015-01-08 | Disposition: A | Discharge status: Home / Self Care | Payer: Medicare Other | Attending: Emergency Medicine | Admitting: Emergency Medicine

## 2015-01-07 ENCOUNTER — Emergency Department: Payer: Medicare Other

## 2015-01-07 DIAGNOSIS — F419 Anxiety disorder, unspecified: Secondary | ICD-10-CM | POA: Diagnosis not present

## 2015-01-07 DIAGNOSIS — R0789 Other chest pain: Secondary | ICD-10-CM | POA: Insufficient documentation

## 2015-01-07 DIAGNOSIS — R0602 Shortness of breath: Secondary | ICD-10-CM | POA: Diagnosis not present

## 2015-01-07 DIAGNOSIS — R079 Chest pain, unspecified: Secondary | ICD-10-CM

## 2015-01-07 HISTORY — DX: Essential (primary) hypertension: I10

## 2015-01-07 LAB — CBC
HCT: 42.4 % (ref 35.0–47.0)
Hemoglobin: 13.9 g/dL (ref 12.0–16.0)
MCH: 25.8 pg — AB (ref 26.0–34.0)
MCHC: 32.7 g/dL (ref 32.0–36.0)
MCV: 78.8 fL — ABNORMAL LOW (ref 80.0–100.0)
PLATELETS: 320 10*3/uL (ref 150–440)
RBC: 5.39 MIL/uL — ABNORMAL HIGH (ref 3.80–5.20)
RDW: 13.8 % (ref 11.5–14.5)
WBC: 11.4 10*3/uL — ABNORMAL HIGH (ref 3.6–11.0)

## 2015-01-07 LAB — COMPREHENSIVE METABOLIC PANEL
ALT: 14 U/L (ref 14–54)
AST: 25 U/L (ref 15–41)
Albumin: 3.8 g/dL (ref 3.5–5.0)
Alkaline Phosphatase: 102 U/L (ref 38–126)
Anion gap: 11 (ref 5–15)
BUN: 15 mg/dL (ref 6–20)
CO2: 29 mmol/L (ref 22–32)
Calcium: 9.3 mg/dL (ref 8.9–10.3)
Chloride: 100 mmol/L — ABNORMAL LOW (ref 101–111)
Creatinine, Ser: 0.98 mg/dL (ref 0.44–1.00)
GFR, EST AFRICAN AMERICAN: 56 mL/min — AB (ref >60)
GFR, EST NON AFRICAN AMERICAN: 48 mL/min — AB (ref >60)
GLUCOSE: 119 mg/dL — AB (ref 65–99)
Potassium: 3.6 mmol/L (ref 3.5–5.1)
Sodium: 140 mmol/L (ref 135–145)
Total Bilirubin: 0.5 mg/dL (ref 0.3–1.2)
Total Protein: 7.7 g/dL (ref 6.5–8.1)

## 2015-01-07 LAB — TROPONIN I

## 2015-01-07 MED ORDER — ALPRAZOLAM 0.5 MG PO TABS: 0.5000 mg | ORAL_TABLET | Freq: Three times a day (TID) | ORAL | Status: DC | PRN

## 2015-01-07 NOTE — ED Provider Notes (Signed)
[facility] Emergency Department Provider Note     Time seen: ----------------------------------------- 8:51 PM on 01/07/2015 -----------------------------------------    I have reviewed the triage vital signs and the nursing notes.   HISTORY  Chief Complaint No chief complaint on file.    HPI Anita Ward is a 81 y.o. female who presents ER for chest tightness and felt like she was shaking. Patient states she was not anxious sutures shaking and trembling. Patient was also belching earlier. She did have some nitroglycerin in route which helped her symptoms. Patient states she feels better currently, has had this happen before. Tightness was mild to moderate in its character so she called EMS. Nothing made her symptoms worse.   No past medical history on file.  There are no active problems to display for this patient.   No past surgical history on file.  Allergies Review of patient's allergies indicates not on file.  Social History History  Substance Use Topics  . Smoking status: Not on file  . Smokeless tobacco: Not on file  . Alcohol Use: Not on file    Review of Systems Constitutional: Negative for fever. Eyes: Negative for visual changes. ENT: Negative for sore throat. Cardiovascular: Positive for chest pain Respiratory: Negative for shortness of breath. Gastrointestinal: Negative for abdominal pain, vomiting and diarrhea. Genitourinary: Negative for dysuria. Musculoskeletal: Negative for back pain. Skin: Negative for rash. Neurological: Negative for headaches, focal weakness or numbness.  10-point ROS otherwise negative.  ____________________________________________   PHYSICAL EXAM:  VITAL SIGNS: ED Triage Vitals  Enc Vitals Group     BP --      Pulse --      Resp --      Temp --      Temp src --      SpO2 --      Weight --      Height --      Head Cir --      Peak Flow --      Pain Score --      Pain Loc --     Pain Edu? --      Excl. in GC? --     Constitutional: Alert and oriented. Anxious Eyes: Conjunctivae are normal. PERRL. Normal extraocular movements. ENT   Head: Normocephalic and atraumatic.   Nose: No congestion/rhinnorhea.   Mouth/Throat: Mucous membranes are moist.   Neck: No stridor. Hematological/Lymphatic/Immunilogical: No cervical lymphadenopathy. Cardiovascular: Normal rate, regular rhythm. Normal and symmetric distal pulses are present in all extremities. No murmurs, rubs, or gallops. Respiratory: Normal respiratory effort without tachypnea nor retractions. Breath sounds are clear and equal bilaterally. No wheezes/rales/rhonchi. Gastrointestinal: Soft and nontender. No distention. No abdominal bruits. There is no CVA tenderness. Musculoskeletal: Nontender with normal range of motion in all extremities. No joint effusions.  Bilateral pitting edema to mid tibias bilaterally Neurologic:  Normal speech and language. No gross focal neurologic deficits are appreciated. Speech is normal. No gait instability. Skin:  Skin is warm, dry and intact. No rash noted. Psychiatric: Mood and affect are normal. Speech and behavior are normal. ____________________________________________  EKG: Interpreted by me. Sinus rhythm with first-degree AV block with PVCs. Borderline LVH, normal axis. No evidence of acute infarction. Rate is 67 bpm  ____________________________________________  ED COURSE:  Pertinent labs & imaging results that were available during my care of the patient were reviewed by me and considered in my medical decision making (see chart for details). Patient with some chest tightness and  anxiety symptoms. Will check heart blood work and do a chest x-ray and EKG. ____________________________________________    LABS (pertinent positives/negatives)  Labs Reviewed  CBC - Abnormal; Notable for the following:    WBC 11.4 (*)    RBC 5.39 (*)    MCV 78.8 (*)    MCH  25.8 (*)    All other components within normal limits  COMPREHENSIVE METABOLIC PANEL - Abnormal; Notable for the following:    Chloride 100 (*)    Glucose, Bld 119 (*)    GFR calc non Af Amer 48 (*)    GFR calc Af Amer 56 (*)    All other components within normal limits  TROPONIN I    RADIOLOGY  Chest x-ray IMPRESSION: Mild chronic interstitial coarsening and slight unchanged vascular prominence. No acute cardiopulmonary findings. ____________________________________________  FINAL ASSESSMENT AND PLAN  Chest tightness, anxiety  Plan: Patient with labs and x-rays as dictated above. Patient has a long history of this, recent saw her doctor for same. EKG is unchanged from prior. I offered admission to the hospital, patient was to go home. Patient may need increase in her anxiolytic medication. Stable for discharge   Emily Filbert, MD   Emily Filbert, MD 01/07/15 [phone]

## 2015-01-07 NOTE — ED Notes (Signed)
Pt arrived to the ED via EMS for complaints of chest pain and shortness of breath. Pt states that she began to feel bad around lunch time  Getting progressively worse. Pt took 1.4 sublingual Nitro with relief and 81mg  of aspirin. Pt reports having and extensive cardiac history. Pt is AOx4 in no apparent distress. Dr. Mayford Knife at bedside upon arrival, EKG showed normal sinus.

## 2015-01-07 NOTE — Discharge Instructions (Signed)
Chest Pain (Nonspecific) °It is often hard to give a specific diagnosis for the cause of chest pain. There is always a chance that your pain could be related to something serious, such as a heart attack or a blood clot in the lungs. You need to follow up with your health care provider for further evaluation. °CAUSES  °· Heartburn. °· Pneumonia or bronchitis. °· Anxiety or stress. °· Inflammation around your heart (pericarditis) or lung (pleuritis or pleurisy). °· A blood clot in the lung. °· A collapsed lung (pneumothorax). It can develop suddenly on its own (spontaneous pneumothorax) or from trauma to the chest. °· Shingles infection (herpes zoster virus). °The chest wall is composed of bones, muscles, and cartilage. Any of these can be the source of the pain. °· The bones can be bruised by injury. °· The muscles or cartilage can be strained by coughing or overwork. °· The cartilage can be affected by inflammation and become sore (costochondritis). °DIAGNOSIS  °Lab tests or other studies may be needed to find the cause of your pain. Your health care provider may have you take a test called an ambulatory electrocardiogram (ECG). An ECG records your heartbeat patterns over a 24-hour period. You may also have other tests, such as: °· Transthoracic echocardiogram (TTE). During echocardiography, sound waves are used to evaluate how blood flows through your heart. °· Transesophageal echocardiogram (TEE). °· Cardiac monitoring. This allows your health care provider to monitor your heart rate and rhythm in real time. °· Holter monitor. This is a portable device that records your heartbeat and can help diagnose heart arrhythmias. It allows your health care provider to track your heart activity for several days, if needed. °· Stress tests by exercise or by giving medicine that makes the heart beat faster. °TREATMENT  °· Treatment depends on what may be causing your chest pain. Treatment may include: °· Acid blockers for  heartburn. °· Anti-inflammatory medicine. °· Pain medicine for inflammatory conditions. °· Antibiotics if an infection is present. °· You may be advised to change lifestyle habits. This includes stopping smoking and avoiding alcohol, caffeine, and chocolate. °· You may be advised to keep your head raised (elevated) when sleeping. This reduces the chance of acid going backward from your stomach into your esophagus. °Most of the time, nonspecific chest pain will improve within 2-3 days with rest and mild pain medicine.  °HOME CARE INSTRUCTIONS  °· If antibiotics were prescribed, take them as directed. Finish them even if you start to feel better. °· For the next few days, avoid physical activities that bring on chest pain. Continue physical activities as directed. °· Do not use any tobacco products, including cigarettes, chewing tobacco, or electronic cigarettes. °· Avoid drinking alcohol. °· Only take medicine as directed by your health care provider. °· Follow your health care provider's suggestions for further testing if your chest pain does not go away. °· Keep any follow-up appointments you made. If you do not go to an appointment, you could develop lasting (chronic) problems with pain. If there is any problem keeping an appointment, call to reschedule. °SEEK MEDICAL CARE IF:  °· Your chest pain does not go away, even after treatment. °· You have a rash with blisters on your chest. °· You have a fever. °SEEK IMMEDIATE MEDICAL CARE IF:  °· You have increased chest pain or pain that spreads to your arm, neck, jaw, back, or abdomen. °· You have shortness of breath. °· You have an increasing cough, or you cough   up blood. °· You have severe back or abdominal pain. °· You feel nauseous or vomit. °· You have severe weakness. °· You faint. °· You have chills. °This is an emergency. Do not wait to see if the pain will go away. Get medical help at once. Call your local emergency services (911 in [place] Do not drive  yourself to the hospital. °MAKE SURE YOU:  °· Understand these instructions. °· Will watch your condition. °· Will get help right away if you are not doing well or get worse. °Document Released: 04/05/2005 Document Revised: 07/01/2013 Document Reviewed: 01/30/2008 °ExitCare® Patient Information ©2015 [employer]. This information is not intended to replace advice given to you by your health care provider. Make sure you discuss any questions you have with your health care provider. ° °Panic Attacks °Panic attacks are sudden, short-lived surges of severe anxiety, fear, or discomfort. They may occur for no reason when you are relaxed, when you are anxious, or when you are sleeping. Panic attacks may occur for a number of reasons:  °· Healthy people occasionally have panic attacks in extreme, life-threatening situations, such as war or natural disasters. Normal anxiety is a protective mechanism of the body that helps us react to danger (fight or flight response). °· Panic attacks are often seen with anxiety disorders, such as panic disorder, social anxiety disorder, generalized anxiety disorder, and phobias. Anxiety disorders cause excessive or uncontrollable anxiety. They may interfere with your relationships or other life activities. °· Panic attacks are sometimes seen with other mental illnesses, such as depression and posttraumatic stress disorder. °· Certain medical conditions, prescription medicines, and drugs of abuse can cause panic attacks. °SYMPTOMS  °Panic attacks start suddenly, peak within 20 minutes, and are accompanied by four or more of the following symptoms: °· Pounding heart or fast heart rate (palpitations). °· Sweating. °· Trembling or shaking. °· Shortness of breath or feeling smothered. °· Feeling choked. °· Chest pain or discomfort. °· Nausea or strange feeling in your stomach. °· Dizziness, light-headedness, or feeling like you will faint. °· Chills or hot flushes. °· Numbness or tingling in  your lips or hands and feet. °· Feeling that things are not real or feeling that you are not yourself. °· Fear of losing control or going crazy. °· Fear of dying. °Some of these symptoms can mimic serious medical conditions. For example, you may think you are having a heart attack. Although panic attacks can be very scary, they are not life threatening. °DIAGNOSIS  °Panic attacks are diagnosed through an assessment by your health care provider. Your health care provider will ask questions about your symptoms, such as where and when they occurred. Your health care provider will also ask about your medical history and use of alcohol and drugs, including prescription medicines. Your health care provider may order blood tests or other studies to rule out a serious medical condition. Your health care provider may refer you to a mental health professional for further evaluation. °TREATMENT  °· Most healthy people who have one or two panic attacks in an extreme, life-threatening situation will not require treatment. °· The treatment for panic attacks associated with anxiety disorders or other mental illness typically involves counseling with a mental health professional, medicine, or a combination of both. Your health care provider will help determine what treatment is best for you. °· Panic attacks due to physical illness usually go away with treatment of the illness. If prescription medicine is causing panic attacks, talk with your health care   provider about stopping the medicine, decreasing the dose, or substituting another medicine. °· Panic attacks due to alcohol or drug abuse go away with abstinence. Some adults need professional help in order to stop drinking or using drugs. °HOME CARE INSTRUCTIONS  °· Take all medicines as directed by your health care provider.   °· Schedule and attend follow-up visits as directed by your health care provider. It is important to keep all your appointments. °SEEK MEDICAL CARE  IF: °· You are not able to take your medicines as prescribed. °· Your symptoms do not improve or get worse. °SEEK IMMEDIATE MEDICAL CARE IF:  °· You experience panic attack symptoms that are different than your usual symptoms. °· You have serious thoughts about hurting yourself or others. °· You are taking medicine for panic attacks and have a serious side effect. °MAKE SURE YOU: °· Understand these instructions. °· Will watch your condition. °· Will get help right away if you are not doing well or get worse. °Document Released: 06/26/2005 Document Revised: 07/01/2013 Document Reviewed: 02/07/2013 °ExitCare® Patient Information ©2015 [employer]. This information is not intended to replace advice given to you by your health care provider. Make sure you discuss any questions you have with your health care provider. ° °

## 2015-01-08 NOTE — ED Notes (Signed)
Pt discharged home after verbalizing understanding of discharge instructions; nad noted. 

## 2015-03-04 ENCOUNTER — Other Ambulatory Visit: Payer: Self-pay | Admitting: Orthopedic Surgery

## 2015-03-04 DIAGNOSIS — M4726 Other spondylosis with radiculopathy, lumbar region: Secondary | ICD-10-CM

## 2015-03-10 ENCOUNTER — Ambulatory Visit
Admission: RE | Admit: 2015-03-10 | Discharge: 2015-03-10 | Disposition: A | Discharge status: Home / Self Care | Payer: Medicare Other | Source: Ambulatory Visit | Attending: Orthopedic Surgery | Admitting: Orthopedic Surgery

## 2015-03-10 DIAGNOSIS — M4726 Other spondylosis with radiculopathy, lumbar region: Secondary | ICD-10-CM

## 2015-03-12 ENCOUNTER — Inpatient Hospital Stay
Admission: EM | Admit: 2015-03-12 | Discharge: 2015-03-19 | DRG: 871 | Disposition: A | Discharge status: Skilled Nursing Facility | Payer: Medicare Other | Attending: Internal Medicine | Admitting: Internal Medicine

## 2015-03-12 ENCOUNTER — Emergency Department: Payer: Medicare Other

## 2015-03-12 ENCOUNTER — Encounter: Payer: Self-pay | Admitting: Emergency Medicine

## 2015-03-12 DIAGNOSIS — E860 Dehydration: Secondary | ICD-10-CM | POA: Diagnosis not present

## 2015-03-12 DIAGNOSIS — R32 Unspecified urinary incontinence: Secondary | ICD-10-CM | POA: Diagnosis present

## 2015-03-12 DIAGNOSIS — R197 Diarrhea, unspecified: Secondary | ICD-10-CM | POA: Diagnosis not present

## 2015-03-12 DIAGNOSIS — A4151 Sepsis due to Escherichia coli [E. coli]: Secondary | ICD-10-CM | POA: Diagnosis present

## 2015-03-12 DIAGNOSIS — R112 Nausea with vomiting, unspecified: Secondary | ICD-10-CM

## 2015-03-12 DIAGNOSIS — N390 Urinary tract infection, site not specified: Secondary | ICD-10-CM | POA: Diagnosis not present

## 2015-03-12 DIAGNOSIS — J9601 Acute respiratory failure with hypoxia: Secondary | ICD-10-CM | POA: Diagnosis not present

## 2015-03-12 DIAGNOSIS — N179 Acute kidney failure, unspecified: Secondary | ICD-10-CM | POA: Diagnosis not present

## 2015-03-12 DIAGNOSIS — A419 Sepsis, unspecified organism: Secondary | ICD-10-CM | POA: Diagnosis not present

## 2015-03-12 DIAGNOSIS — I509 Heart failure, unspecified: Secondary | ICD-10-CM

## 2015-03-12 DIAGNOSIS — Z515 Encounter for palliative care: Secondary | ICD-10-CM | POA: Diagnosis not present

## 2015-03-12 DIAGNOSIS — R739 Hyperglycemia, unspecified: Secondary | ICD-10-CM | POA: Diagnosis present

## 2015-03-12 DIAGNOSIS — N3090 Cystitis, unspecified without hematuria: Secondary | ICD-10-CM

## 2015-03-12 DIAGNOSIS — H409 Unspecified glaucoma: Secondary | ICD-10-CM | POA: Diagnosis present

## 2015-03-12 DIAGNOSIS — J9600 Acute respiratory failure, unspecified whether with hypoxia or hypercapnia: Secondary | ICD-10-CM | POA: Diagnosis not present

## 2015-03-12 DIAGNOSIS — I2510 Atherosclerotic heart disease of native coronary artery without angina pectoris: Secondary | ICD-10-CM | POA: Diagnosis not present

## 2015-03-12 DIAGNOSIS — Z66 Do not resuscitate: Secondary | ICD-10-CM | POA: Diagnosis present

## 2015-03-12 DIAGNOSIS — M810 Age-related osteoporosis without current pathological fracture: Secondary | ICD-10-CM | POA: Diagnosis present

## 2015-03-12 DIAGNOSIS — Z881 Allergy status to other antibiotic agents status: Secondary | ICD-10-CM

## 2015-03-12 DIAGNOSIS — Z79899 Other long term (current) drug therapy: Secondary | ICD-10-CM

## 2015-03-12 DIAGNOSIS — M40204 Unspecified kyphosis, thoracic region: Secondary | ICD-10-CM | POA: Diagnosis present

## 2015-03-12 DIAGNOSIS — E871 Hypo-osmolality and hyponatremia: Secondary | ICD-10-CM

## 2015-03-12 DIAGNOSIS — R410 Disorientation, unspecified: Secondary | ICD-10-CM | POA: Diagnosis not present

## 2015-03-12 DIAGNOSIS — Z880 Allergy status to penicillin: Secondary | ICD-10-CM | POA: Diagnosis not present

## 2015-03-12 DIAGNOSIS — I5032 Chronic diastolic (congestive) heart failure: Secondary | ICD-10-CM | POA: Diagnosis not present

## 2015-03-12 DIAGNOSIS — I129 Hypertensive chronic kidney disease with stage 1 through stage 4 chronic kidney disease, or unspecified chronic kidney disease: Secondary | ICD-10-CM | POA: Diagnosis not present

## 2015-03-12 DIAGNOSIS — E876 Hypokalemia: Secondary | ICD-10-CM | POA: Diagnosis not present

## 2015-03-12 DIAGNOSIS — Z882 Allergy status to sulfonamides status: Secondary | ICD-10-CM

## 2015-03-12 DIAGNOSIS — J189 Pneumonia, unspecified organism: Secondary | ICD-10-CM | POA: Diagnosis present

## 2015-03-12 DIAGNOSIS — Z888 Allergy status to other drugs, medicaments and biological substances status: Secondary | ICD-10-CM

## 2015-03-12 DIAGNOSIS — K5900 Constipation, unspecified: Secondary | ICD-10-CM | POA: Diagnosis not present

## 2015-03-12 DIAGNOSIS — F419 Anxiety disorder, unspecified: Secondary | ICD-10-CM | POA: Diagnosis not present

## 2015-03-12 DIAGNOSIS — E039 Hypothyroidism, unspecified: Secondary | ICD-10-CM | POA: Diagnosis present

## 2015-03-12 DIAGNOSIS — R1011 Right upper quadrant pain: Secondary | ICD-10-CM

## 2015-03-12 DIAGNOSIS — J984 Other disorders of lung: Secondary | ICD-10-CM | POA: Diagnosis present

## 2015-03-12 DIAGNOSIS — J9690 Respiratory failure, unspecified, unspecified whether with hypoxia or hypercapnia: Secondary | ICD-10-CM

## 2015-03-12 DIAGNOSIS — I082 Rheumatic disorders of both aortic and tricuspid valves: Secondary | ICD-10-CM | POA: Diagnosis present

## 2015-03-12 DIAGNOSIS — R109 Unspecified abdominal pain: Secondary | ICD-10-CM

## 2015-03-12 DIAGNOSIS — N189 Chronic kidney disease, unspecified: Secondary | ICD-10-CM | POA: Diagnosis present

## 2015-03-12 DIAGNOSIS — Z9981 Dependence on supplemental oxygen: Secondary | ICD-10-CM | POA: Diagnosis not present

## 2015-03-12 DIAGNOSIS — N3000 Acute cystitis without hematuria: Secondary | ICD-10-CM | POA: Diagnosis not present

## 2015-03-12 HISTORY — DX: Unspecified glaucoma: H40.9

## 2015-03-12 HISTORY — DX: Age-related osteoporosis without current pathological fracture: M81.0

## 2015-03-12 HISTORY — DX: Atherosclerotic heart disease of native coronary artery without angina pectoris: I25.10

## 2015-03-12 LAB — COMPREHENSIVE METABOLIC PANEL
ALT: 20 U/L (ref 14–54)
ANION GAP: 10 (ref 5–15)
AST: 40 U/L (ref 15–41)
Albumin: 3 g/dL — ABNORMAL LOW (ref 3.5–5.0)
Alkaline Phosphatase: 75 U/L (ref 38–126)
BUN: 31 mg/dL — ABNORMAL HIGH (ref 6–20)
CHLORIDE: 86 mmol/L — AB (ref 101–111)
CO2: 27 mmol/L (ref 22–32)
CREATININE: 1.73 mg/dL — AB (ref 0.44–1.00)
Calcium: 8.2 mg/dL — ABNORMAL LOW (ref 8.9–10.3)
GFR, EST AFRICAN AMERICAN: 28 mL/min — AB (ref >60)
GFR, EST NON AFRICAN AMERICAN: 24 mL/min — AB (ref >60)
Glucose, Bld: 107 mg/dL — ABNORMAL HIGH (ref 65–99)
Potassium: 3.4 mmol/L — ABNORMAL LOW (ref 3.5–5.1)
Sodium: 123 mmol/L — ABNORMAL LOW (ref 135–145)
Total Bilirubin: 0.8 mg/dL (ref 0.3–1.2)
Total Protein: 6.9 g/dL (ref 6.5–8.1)

## 2015-03-12 LAB — URINALYSIS COMPLETE WITH MICROSCOPIC (ARMC ONLY)
BILIRUBIN URINE: NEGATIVE
Glucose, UA: NEGATIVE mg/dL
Ketones, ur: NEGATIVE mg/dL
Nitrite: NEGATIVE
Protein, ur: 30 mg/dL — AB
Specific Gravity, Urine: 1.005 (ref 1.005–1.030)
pH: 6 (ref 5.0–8.0)

## 2015-03-12 LAB — CBC
HCT: 35.5 % (ref 35.0–47.0)
HEMOGLOBIN: 12 g/dL (ref 12.0–16.0)
MCH: 26 pg (ref 26.0–34.0)
MCHC: 33.6 g/dL (ref 32.0–36.0)
MCV: 77.2 fL — AB (ref 80.0–100.0)
PLATELETS: 237 10*3/uL (ref 150–440)
RBC: 4.6 MIL/uL (ref 3.80–5.20)
RDW: 14.3 % (ref 11.5–14.5)
WBC: 19 10*3/uL — AB (ref 3.6–11.0)

## 2015-03-12 LAB — LIPASE, BLOOD: LIPASE: 12 U/L — AB (ref 22–51)

## 2015-03-12 LAB — LACTIC ACID, PLASMA
Lactic Acid, Venous: 1.2 mmol/L (ref 0.5–2.0)
Lactic Acid, Venous: 1.3 mmol/L (ref 0.5–2.0)

## 2015-03-12 MED ORDER — NITROGLYCERIN 0.4 MG SL SUBL
0.4000 mg | SUBLINGUAL_TABLET | SUBLINGUAL | Status: DC | PRN
  Administered 2015-03-17: 20:00:00 0.4 mg via SUBLINGUAL
  Filled 2015-03-12: qty 1

## 2015-03-12 MED ORDER — NIFEDIPINE ER 30 MG PO TB24
30.0000 mg | ORAL_TABLET | Freq: Every day | ORAL | Status: DC
  Administered 2015-03-13 – 2015-03-19 (×7): 30 mg via ORAL
  Filled 2015-03-12 (×13): qty 1

## 2015-03-12 MED ORDER — ALPRAZOLAM 0.25 MG PO TABS
0.2500 mg | ORAL_TABLET | Freq: Two times a day (BID) | ORAL | Status: DC | PRN
  Administered 2015-03-13 – 2015-03-15 (×5): 0.25 mg via ORAL
  Filled 2015-03-12 (×6): qty 1

## 2015-03-12 MED ORDER — VITAMIN B-12 1000 MCG PO TABS
1000.0000 ug | ORAL_TABLET | Freq: Every day | ORAL | Status: DC
  Administered 2015-03-13 – 2015-03-14 (×2): 1000 ug via ORAL
  Filled 2015-03-12 (×2): qty 1

## 2015-03-12 MED ORDER — CALCIUM CARBONATE-VITAMIN D 500-200 MG-UNIT PO TABS
1.0000 | ORAL_TABLET | Freq: Two times a day (BID) | ORAL | Status: DC
  Administered 2015-03-12 – 2015-03-14 (×5): 1 via ORAL
  Filled 2015-03-12 (×5): qty 1

## 2015-03-12 MED ORDER — PANTOPRAZOLE SODIUM 40 MG PO TBEC
40.0000 mg | DELAYED_RELEASE_TABLET | Freq: Every day | ORAL | Status: DC
  Administered 2015-03-12 – 2015-03-19 (×8): 40 mg via ORAL
  Filled 2015-03-12 (×8): qty 1

## 2015-03-12 MED ORDER — TRAMADOL HCL 50 MG PO TABS
50.0000 mg | ORAL_TABLET | Freq: Three times a day (TID) | ORAL | Status: DC | PRN
  Administered 2015-03-13 – 2015-03-17 (×7): 50 mg via ORAL
  Filled 2015-03-12 (×7): qty 1

## 2015-03-12 MED ORDER — SODIUM CHLORIDE 0.9 % IV SOLN
INTRAVENOUS | Status: DC
  Administered 2015-03-12 (×2): via INTRAVENOUS

## 2015-03-12 MED ORDER — DEXTROSE 5 % IV SOLN
1.0000 g | Freq: Every day | INTRAVENOUS | Status: DC
  Administered 2015-03-12: 1 g via INTRAVENOUS
  Filled 2015-03-12: qty 10

## 2015-03-12 MED ORDER — MAGNESIUM OXIDE 400 (241.3 MG) MG PO TABS
400.0000 mg | ORAL_TABLET | Freq: Two times a day (BID) | ORAL | Status: DC
  Administered 2015-03-12 – 2015-03-14 (×5): 400 mg via ORAL
  Filled 2015-03-12 (×5): qty 1

## 2015-03-12 MED ORDER — ONDANSETRON HCL 4 MG/2ML IJ SOLN: 4.0000 mg | Freq: Four times a day (QID) | INTRAMUSCULAR | Status: DC | PRN

## 2015-03-12 MED ORDER — METOPROLOL SUCCINATE ER 50 MG PO TB24
100.0000 mg | ORAL_TABLET | Freq: Every day | ORAL | Status: DC
  Administered 2015-03-13: 13:00:00 100 mg via ORAL
  Filled 2015-03-12: qty 2

## 2015-03-12 MED ORDER — LEVOTHYROXINE SODIUM 50 MCG PO TABS
25.0000 ug | ORAL_TABLET | Freq: Every day | ORAL | Status: DC
  Administered 2015-03-13 – 2015-03-19 (×7): 25 ug via ORAL
  Filled 2015-03-12 (×7): qty 1

## 2015-03-12 MED ORDER — PIPERACILLIN-TAZOBACTAM 3.375 G IVPB
3.3750 g | Freq: Once | INTRAVENOUS | Status: AC
  Administered 2015-03-12: 3.375 g via INTRAVENOUS
  Filled 2015-03-12: qty 50

## 2015-03-12 MED ORDER — MORPHINE SULFATE (PF) 2 MG/ML IV SOLN
2.0000 mg | Freq: Once | INTRAVENOUS | Status: AC
  Administered 2015-03-12: 2 mg via INTRAVENOUS
  Filled 2015-03-12: qty 1

## 2015-03-12 MED ORDER — LEVOFLOXACIN IN D5W 250 MG/50ML IV SOLN: 250.0000 mg | INTRAVENOUS | Status: DC

## 2015-03-12 MED ORDER — SODIUM CHLORIDE 0.9 % IV SOLN
Freq: Once | INTRAVENOUS | Status: AC
  Administered 2015-03-12: 16:00:00 via INTRAVENOUS

## 2015-03-12 MED ORDER — ONDANSETRON HCL 4 MG/2ML IJ SOLN
4.0000 mg | Freq: Once | INTRAMUSCULAR | Status: AC
  Administered 2015-03-12: 4 mg via INTRAVENOUS
  Filled 2015-03-12: qty 2

## 2015-03-12 MED ORDER — ENOXAPARIN SODIUM 30 MG/0.3ML ~~LOC~~ SOLN
30.0000 mg | SUBCUTANEOUS | Status: DC
  Administered 2015-03-12 – 2015-03-14 (×3): 30 mg via SUBCUTANEOUS
  Filled 2015-03-12 (×3): qty 0.3

## 2015-03-12 MED ORDER — ACETAMINOPHEN 325 MG PO TABS
650.0000 mg | ORAL_TABLET | Freq: Four times a day (QID) | ORAL | Status: DC | PRN
  Administered 2015-03-12: 650 mg via ORAL
  Filled 2015-03-12: qty 2

## 2015-03-12 MED ORDER — ISOSORBIDE MONONITRATE ER 30 MG PO TB24
30.0000 mg | ORAL_TABLET | Freq: Two times a day (BID) | ORAL | Status: DC
  Administered 2015-03-12 – 2015-03-19 (×14): 30 mg via ORAL
  Filled 2015-03-12 (×14): qty 1

## 2015-03-12 MED ORDER — ACETAMINOPHEN 325 MG PO TABS
ORAL_TABLET | ORAL | Status: AC
  Filled 2015-03-12: qty 2

## 2015-03-12 MED ORDER — INFLUENZA VAC SPLIT QUAD 0.5 ML IM SUSY: 0.5000 mL | PREFILLED_SYRINGE | INTRAMUSCULAR | Status: DC

## 2015-03-12 NOTE — H&P (Signed)
[facility] [place] at [facility]   PATIENT NAME: Anita Ward    MR#:  [id number]  DATE OF BIRTH:  1919/08/19  DATE OF ADMISSION:  03/12/2015  PRIMARY CARE PHYSICIAN: Pcp Not In System   REQUESTING/REFERRING PHYSICIAN: Mayford Knife  CHIEF COMPLAINT:   Chief Complaint  Patient presents with  . Abdominal Pain  . Emesis  . Diarrhea    HISTORY OF PRESENT ILLNESS: Anita Ward  is a 81 y.o. female with a known history of  hypertension, coronary artery disease, osteoporosis, glaucoma has been feeling nauseated she also vomited 2-3 times and had developed  loose stool for last 1 week. She also had the chills and generalized weakness, she also lost control on her bladder while rushing to the bathroom once in last few days. Her grandson reports she was treated for UTI in the last 1-2 month but does not remember exactly when and which antibiotic.  In ER she is noted to have UTI and elevated white cell count she is also some dehydration and worsening in renal function with hyponatremia so given to hospitalist team for further management.  PAST MEDICAL HISTORY:   Past Medical History  Diagnosis Date  . HTN (hypertension)   . Coronary artery disease   . Glaucoma   . Osteoporosis     PAST SURGICAL HISTORY:  Past Surgical History  Procedure Laterality Date  . Cardiac surgery    . Cardiac surgery      SOCIAL HISTORY:  Social History  Substance Use Topics  . Smoking status: Never Smoker   . Smokeless tobacco: Not on file  . Alcohol Use: No    FAMILY HISTORY:  Family History  Problem Relation Age of Onset  . Congestive Heart Failure Father   . CAD Brother     DRUG ALLERGIES:  Allergies  Allergen Reactions  . Benicar [Olmesartan] Other (See Comments)    Reaction:  Unknown   . Cardura [Doxazosin Mesylate] Other (See Comments)    Reaction:  Unknown   . Ciprofloxacin Other (See Comments)    Reaction:  Unknown  . Fosamax [Alendronate Sodium] Other (See  Comments)    Reaction:  Unknown  . Lisinopril Other (See Comments)    Reaction:  Unknown   . Norvasc [Amlodipine Besylate] Other (See Comments)    Reaction:  Unknown   . Penicillins Other (See Comments)    Reaction:  Unknown   . Sulfa Antibiotics Other (See Comments)    Reaction:  Unknown   . Torsemide Anxiety    REVIEW OF SYSTEMS:   CONSTITUTIONAL: No fever, fatigue or weakness. Have chills. EYES: No blurred or double vision.  EARS, NOSE, AND THROAT: No tinnitus or ear pain.  RESPIRATORY: No cough, shortness of breath, wheezing or hemoptysis.  CARDIOVASCULAR: No chest pain, orthopnea, edema.  GASTROINTESTINAL: No nausea, vomiting, diarrhea or abdominal pain.  GENITOURINARY: No dysuria, hematuria. Have increased frequency. ENDOCRINE: No polyuria, nocturia. HEMATOLOGY: No anemia, easy bruising or bleeding SKIN: No rash or lesion. MUSCULOSKELETAL: No joint pain or arthritis.   NEUROLOGIC: No tingling, numbness, weakness.  PSYCHIATRY: No anxiety or depression.   MEDICATIONS AT HOME:  Prior to Admission medications   Medication Sig Start Date End Date Taking? Authorizing Provider  ALPRAZolam (XANAX) 0.25 MG tablet Take 0.25 mg by mouth 2 (two) times daily as needed for anxiety.   Yes Historical Provider, MD  Calcium Carbonate-Vitamin D 600-200 MG-UNIT TABS Take 1 tablet by mouth 2 (two) times daily.   Yes Historical  Provider, MD  cloNIDine (CATAPRES) 0.2 MG tablet Take 0.2 mg by mouth daily after breakfast.   Yes Historical Provider, MD  cloNIDine (CATAPRES) 0.3 MG tablet Take 0.3 mg by mouth every evening.   Yes Historical Provider, MD  CRANBERRY PO Take 1 capsule by mouth daily.   Yes Historical Provider, MD  furosemide (LASIX) 20 MG tablet Take 20 mg by mouth 2 (two) times daily.   Yes Historical Provider, MD  isosorbide mononitrate (IMDUR) 30 MG 24 hr tablet Take 30 mg by mouth 2 (two) times daily.   Yes Historical Provider, MD  levothyroxine (SYNTHROID, LEVOTHROID) 25 MCG  tablet Take 25 mcg by mouth daily before breakfast.   Yes Historical Provider, MD  losartan (COZAAR) 100 MG tablet Take 100 mg by mouth daily.   Yes Historical Provider, MD  Magnesium 100 MG TABS Take 100 mg by mouth 2 (two) times daily.   Yes Historical Provider, MD  metoprolol succinate (TOPROL-XL) 100 MG 24 hr tablet Take 100 mg by mouth daily.   Yes Historical Provider, MD  NIFEdipine (PROCARDIA-XL/ADALAT-CC/NIFEDICAL-XL) 30 MG 24 hr tablet Take 30 mg by mouth daily.   Yes Historical Provider, MD  nitroGLYCERIN (NITROSTAT) 0.4 MG SL tablet Place 0.4 mg under the tongue every 5 (five) minutes as needed for chest pain.   Yes Historical Provider, MD  pantoprazole (PROTONIX) 40 MG tablet Take 40 mg by mouth daily.   Yes Historical Provider, MD  traMADol (ULTRAM) 50 MG tablet Take 50 mg by mouth 3 (three) times daily as needed for moderate pain.   Yes Historical Provider, MD  vitamin B-12 (CYANOCOBALAMIN) 1000 MCG tablet Take 1,000 mcg by mouth daily.   Yes Historical Provider, MD     PHYSICAL EXAMINATION:   VITAL SIGNS: Blood pressure 144/65, pulse 76, temperature 99.7 F (37.6 C), temperature source Oral, resp. rate 22, height  (1.575 m), weight 61.236 kg (135 lb), SpO2 92 %.  GENERAL:  81 y.o.-year-old patient lying in the bed with no acute distress.  EYES: Pupils equal, round, reactive to light and accommodation. No scleral icterus. Extraocular muscles intact.  HEENT: Head atraumatic, normocephalic. Oropharynx and nasopharynx clear.  NECK:  Supple, no jugular venous distention. No thyroid enlargement, no tenderness.  LUNGS: Normal breath sounds bilaterally, no wheezing, rales,rhonchi or crepitation. No use of accessory muscles of respiration.  CARDIOVASCULAR: S1, S2 normal. No murmurs, rubs, or gallops.  ABDOMEN: Soft, nontender, distended. Bowel sounds present. No organomegaly or mass.  EXTREMITIES: No pedal edema, cyanosis, or clubbing.  NEUROLOGIC: Cranial nerves II through XII are  intact. Muscle strength 5/5 in all extremities. Sensation intact. Gait not checked.  PSYCHIATRIC: The patient is alert and oriented x 3.  SKIN: No obvious rash, lesion, or ulcer.   LABORATORY PANEL:   CBC  Recent Labs Lab 03/12/15 1424  WBC 19.0*  HGB 12.0  HCT 35.5  PLT 237  MCV 77.2*  MCH 26.0  MCHC 33.6  RDW 14.3   ------------------------------------------------------------------------------------------------------------------  Chemistries   Recent Labs Lab 03/12/15 1424  NA 123*  K 3.4*  CL 86*  CO2 27  GLUCOSE 107*  BUN 31*  CREATININE 1.73*  CALCIUM 8.2*  AST 40  ALT 20  ALKPHOS 75  BILITOT 0.8   ------------------------------------------------------------------------------------------------------------------ estimated creatinine clearance is 17.5 mL/min (by C-G formula based on Cr of 1.73). ------------------------------------------------------------------------------------------------------------------ No results for input(s): TSH, T4TOTAL, T3FREE, THYROIDAB in the last 72 hours.  Invalid input(s): FREET3   Coagulation profile No results for input(s): INR,  PROTIME in the last 168 hours. ------------------------------------------------------------------------------------------------------------------- No results for input(s): DDIMER in the last 72 hours. -------------------------------------------------------------------------------------------------------------------  Cardiac Enzymes No results for input(s): CKMB, TROPONINI, MYOGLOBIN in the last 168 hours.  Invalid input(s): CK ------------------------------------------------------------------------------------------------------------------ Invalid input(s): POCBNP  ---------------------------------------------------------------------------------------------------------------  Urinalysis    Component Value Date/Time   COLORURINE YELLOW* 03/12/2015 1633   COLORURINE Colorless 04/06/2013 0550    APPEARANCEUR CLOUDY* 03/12/2015 1633   APPEARANCEUR Clear 04/06/2013 0550   LABSPEC 1.005 03/12/2015 1633   LABSPEC 1.001 04/06/2013 0550   PHURINE 6.0 03/12/2015 1633   PHURINE 8.0 04/06/2013 0550   GLUCOSEU NEGATIVE 03/12/2015 1633   GLUCOSEU Negative 04/06/2013 0550   HGBUR 2+* 03/12/2015 1633   HGBUR Negative 04/06/2013 0550   BILIRUBINUR NEGATIVE 03/12/2015 1633   BILIRUBINUR Negative 04/06/2013 0550   KETONESUR NEGATIVE 03/12/2015 1633   KETONESUR Negative 04/06/2013 0550   PROTEINUR 30* 03/12/2015 1633   PROTEINUR Negative 04/06/2013 0550   NITRITE NEGATIVE 03/12/2015 1633   NITRITE Negative 04/06/2013 0550   LEUKOCYTESUR 2+* 03/12/2015 1633   LEUKOCYTESUR Negative 04/06/2013 0550     RADIOLOGY: Dg Chest 2 View  03/12/2015   CLINICAL DATA:  Right upper quadrant pain. Fever and low oxygen saturations.  EXAM: CHEST  2 VIEW  COMPARISON:  01/07/2015  FINDINGS: Mild blunting at the costophrenic angles. Heart size is upper limits of normal but stable. Post CABG changes. No pulmonary edema. Atherosclerotic calcifications at the aortic arch. Trachea is midline. There may be small parenchymal densities versus pleural fluid at the posterior lung bases.  IMPRESSION: Mild blunting at the costophrenic angles, left side greater than right. Findings may be related to small effusions. Cannot exclude subtle airspace disease along the posterior lung bases.   Electronically Signed   By: Richarda Overlie M.D.   On: 03/12/2015 15:01   US Abdomen Limited Ruq  03/12/2015   CLINICAL DATA:  Three-week history of right upper quadrant pain with fever and chills  EXAM: US ABDOMEN LIMITED - RIGHT UPPER QUADRANT  COMPARISON:  None.  FINDINGS: Gallbladder:  Surgically absent.  Common bile duct:  Diameter: 5 mm. No intrahepatic or extrahepatic biliary duct dilatation.  Liver:  No focal lesion identified. Within normal limits in parenchymal echogenicity.  Small right pleural effusion noted.  IMPRESSION: Small right  pleural effusion. Gallbladder absent. Study otherwise unremarkable.   Electronically Signed   By: Bretta Bang III M.D.   On: 03/12/2015 16:00    IMPRESSION AND PLAN:  * Uti  IV levaquin.  Send urine culture.  * ARF- dehydration   IV fluids and monitor.  * Hyponatremia   IV fluids.  * Nausea and diarrhea   May be due to UTi   She also said- had some UTI in last 1-2 months and got ABx- will check for C diff  * Hypertension   Continue home meds- hold lasix  * CAD   Continue meds   All the records are reviewed and case discussed with ED provider. Management plans discussed with the patient, family and they are in agreement.  CODE STATUS: FUll   TOTAL TIME TAKING CARE OF THIS PATIENT: 50 minutes.    Altamese Dilling M.D on 03/12/2015   Between 7am to 6pm - Pager - [phone]  After 6pm go to [email or web address] - password [email or web address]  [facility]  Office  [phone]  CC: Primary care physician; Pcp Not In System

## 2015-03-12 NOTE — ED Notes (Signed)
Patient transported to Ultrasound 

## 2015-03-12 NOTE — ED Provider Notes (Signed)
[facility] Emergency Department Provider Note     Time seen: ----------------------------------------- 2:37 PM on 03/12/2015 -----------------------------------------    I have reviewed the triage vital signs and the nursing notes.   HISTORY  Chief Complaint Abdominal Pain; Emesis; and Diarrhea    HPI Anita Ward is a 81 y.o. female resents ER with right upper quadrant abdominal pain. Patient has had pain associated with nausea vomiting diarrhea since Sunday. Also states she's had some fever and chills.Patient claims of a dull pain in the right upper quadrant currently, nothing makes it better or worse. She has not had a history of same. Patient states she's been having severe chills since Monday with fever at home.   Past Medical History  Diagnosis Date  . HTN (hypertension)   . Coronary artery disease     There are no active problems to display for this patient.   Past Surgical History  Procedure Laterality Date  . Cardiac surgery    . Cardiac surgery      Allergies Review of patient's allergies indicates no known allergies.  Social History Social History  Substance Use Topics  . Smoking status: Never Smoker   . Smokeless tobacco: None  . Alcohol Use: No    Review of Systems Constitutional: Positive for fever and chills Eyes: Negative for visual changes. ENT: Negative for sore throat. Cardiovascular: Negative for chest pain. Respiratory: Negative for shortness of breath. Gastrointestinal: Nausea for abdominal pain, vomiting and diarrhea. Genitourinary: Negative for dysuria. Musculoskeletal: Negative for back pain. Skin: Negative for rash. Neurological: Negative for headaches, focal weakness or numbness.  10-point ROS otherwise negative.  ____________________________________________   PHYSICAL EXAM:  VITAL SIGNS: ED Triage Vitals  Enc Vitals Group     BP 03/12/15 1355 142/52 mmHg     Pulse Rate 03/12/15 1355 80      Resp 03/12/15 1355 18     Temp 03/12/15 1355 99.7 F (37.6 C)     Temp Source 03/12/15 1355 Oral     SpO2 03/12/15 1357 89 %     Weight 03/12/15 1355 135 lb (61.236 kg)     Height 03/12/15 1355 5\' 2"  (1.575 m)     Head Cir --      Peak Flow --      Pain Score 03/12/15 1357 0     Pain Loc --      Pain Edu? --      Excl. in GC? --     Constitutional: Alert and oriented. Well appearing and in no distress. Eyes: Conjunctivae are normal. PERRL. Normal extraocular movements. ENT   Head: Normocephalic and atraumatic.   Nose: No congestion/rhinnorhea.   Mouth/Throat: Mucous membranes are moist.   Neck: No stridor. Cardiovascular: Normal rate, regular rhythm. Normal and symmetric distal pulses are present in all extremities. No murmurs, rubs, or gallops. Respiratory: Normal respiratory effort without tachypnea nor retractions. Breath sounds are clear and equal bilaterally. No wheezes/rales/rhonchi. Gastrointestinal: Negative Murphy sign, positive right upper quadrant pain however. Mildly tender to touch. Normal bowel sounds. Musculoskeletal: Nontender with normal range of motion in all extremities. No joint effusions.  No lower extremity tenderness nor edema. Neurologic:  Normal speech and language. No gross focal neurologic deficits are appreciated. Speech is normal. No gait instability. Skin:  Skin is warm, dry and intact. No rash noted. Psychiatric: Mood and affect are normal. Speech and behavior are normal. Patient exhibits appropriate insight and judgment. ____________________________________________  EKG: Interpreted by me. Normal sinus rhythm with  first-degree AV block, rate is 81 bpm, normal axis. Nonspecific ST-T wave changes.  ____________________________________________  ED COURSE:  Pertinent labs & imaging results that were available during my care of the patient were reviewed by me and considered in my medical decision making (see chart for  details).  ____________________________________________    LABS (pertinent positives/negatives)  Labs Reviewed  LIPASE, BLOOD - Abnormal; Notable for the following:    Lipase 12 (*)    All other components within normal limits  COMPREHENSIVE METABOLIC PANEL - Abnormal; Notable for the following:    Sodium 123 (*)    Potassium 3.4 (*)    Chloride 86 (*)    Glucose, Bld 107 (*)    BUN 31 (*)    Creatinine, Ser 1.73 (*)    Calcium 8.2 (*)    Albumin 3.0 (*)    GFR calc non Af Amer 24 (*)    GFR calc Af Amer 28 (*)    All other components within normal limits  CBC - Abnormal; Notable for the following:    WBC 19.0 (*)    MCV 77.2 (*)    All other components within normal limits  URINALYSIS COMPLETEWITH MICROSCOPIC ([facility] ONLY) - Abnormal; Notable for the following:    Color, Urine YELLOW (*)    APPearance CLOUDY (*)    Hgb urine dipstick 2+ (*)    Protein, ur 30 (*)    Leukocytes, UA 2+ (*)    Bacteria, UA MANY (*)    Squamous Epithelial / LPF 6-30 (*)    All other components within normal limits  CULTURE, BLOOD (ROUTINE X 2)  CULTURE, BLOOD (ROUTINE X 2)  LACTIC ACID, PLASMA  LACTIC ACID, PLASMA    RADIOLOGY Images were viewed by me  Chest x-ray, right quadrant ultrasound Unremarkable except for small basilar effusions ____________________________________________  FINAL ASSESSMENT AND PLAN  Fever, chills, abdominal pain, status  Plan: Patient with labs and imaging as dictated above. Patient with fever, chills likely coming from a UTI. She is markedly leukocytosis and concerning findings for bacteremia. She was given Vancocin,  Zosyn, would benefit from hospitalization   Emily Filbert, MD   Emily Filbert, MD 03/12/15 [phone]

## 2015-03-12 NOTE — Plan of Care (Signed)
Problem: Discharge Progression Outcomes Goal: Discharge plan in place and appropriate Individualization Pt lives at home alone-alert and oriented Admitted with UTI

## 2015-03-12 NOTE — ED Notes (Signed)
C/o RUQ abd. Pain with n,v,d since Sunday, also states she has had fever/chills

## 2015-03-12 NOTE — ED Notes (Signed)
Attempted to call report

## 2015-03-12 NOTE — ED Notes (Signed)
Attempt to call report 2nd time.

## 2015-03-12 NOTE — ED Notes (Signed)
Patient transported to X-ray 

## 2015-03-12 NOTE — ED Notes (Addendum)
Pts O2 stat at 86% on 4L oxygen, sitting up in bed. Pt A/OX4, skin tone appropriate.  Pt reports no resp distress or CP. Pt placed on non rebreather.

## 2015-03-13 ENCOUNTER — Inpatient Hospital Stay: Payer: Medicare Other

## 2015-03-13 LAB — CBC
HEMATOCRIT: 34.7 % — AB (ref 35.0–47.0)
HEMOGLOBIN: 11.6 g/dL — AB (ref 12.0–16.0)
MCH: 26.1 pg (ref 26.0–34.0)
MCHC: 33.5 g/dL (ref 32.0–36.0)
MCV: 78.1 fL — AB (ref 80.0–100.0)
Platelets: 219 10*3/uL (ref 150–440)
RBC: 4.45 MIL/uL (ref 3.80–5.20)
RDW: 14.6 % — AB (ref 11.5–14.5)
WBC: 16.7 10*3/uL — ABNORMAL HIGH (ref 3.6–11.0)

## 2015-03-13 LAB — BASIC METABOLIC PANEL
Anion gap: 6 (ref 5–15)
BUN: 26 mg/dL — AB (ref 6–20)
CHLORIDE: 99 mmol/L — AB (ref 101–111)
CO2: 28 mmol/L (ref 22–32)
Calcium: 7.5 mg/dL — ABNORMAL LOW (ref 8.9–10.3)
Creatinine, Ser: 1.47 mg/dL — ABNORMAL HIGH (ref 0.44–1.00)
GFR calc Af Amer: 34 mL/min — ABNORMAL LOW (ref >60)
GFR, EST NON AFRICAN AMERICAN: 30 mL/min — AB (ref >60)
GLUCOSE: 108 mg/dL — AB (ref 65–99)
POTASSIUM: 3.2 mmol/L — AB (ref 3.5–5.1)
Sodium: 133 mmol/L — ABNORMAL LOW (ref 135–145)

## 2015-03-13 MED ORDER — IPRATROPIUM-ALBUTEROL 0.5-2.5 (3) MG/3ML IN SOLN
3.0000 mL | Freq: Four times a day (QID) | RESPIRATORY_TRACT | Status: DC
  Administered 2015-03-13 – 2015-03-16 (×13): 3 mL via RESPIRATORY_TRACT
  Filled 2015-03-13 (×14): qty 3

## 2015-03-13 MED ORDER — AZITHROMYCIN 500 MG IV SOLR
500.0000 mg | INTRAVENOUS | Status: DC
  Administered 2015-03-13 – 2015-03-15 (×3): 500 mg via INTRAVENOUS
  Filled 2015-03-13 (×5): qty 500

## 2015-03-13 MED ORDER — POTASSIUM CHLORIDE IN NACL 40-0.9 MEQ/L-% IV SOLN
INTRAVENOUS | Status: DC
  Administered 2015-03-13: 13:00:00 75 mL/h via INTRAVENOUS
  Filled 2015-03-13 (×3): qty 1000

## 2015-03-13 MED ORDER — FUROSEMIDE 10 MG/ML IJ SOLN
40.0000 mg | Freq: Once | INTRAMUSCULAR | Status: AC
  Administered 2015-03-13: 19:00:00 40 mg via INTRAVENOUS
  Filled 2015-03-13: qty 4

## 2015-03-13 MED ORDER — SODIUM CHLORIDE 0.9 % IV SOLN
500.0000 mg | Freq: Two times a day (BID) | INTRAVENOUS | Status: DC
  Administered 2015-03-13 – 2015-03-14 (×3): 500 mg via INTRAVENOUS
  Filled 2015-03-13 (×5): qty 0.5

## 2015-03-13 NOTE — Plan of Care (Signed)
Problem: Discharge Progression Outcomes Goal: Other Discharge Outcomes/Goals Outcome: Progressing Plan of care progress to goal for: 1. Discharge Plan:         PT Evaluated. Recommended SNF.         SW Consult. 2. Pain:         Pain Controlled with Appropriate Interventions. 3. Hemodynamically Stable:         Low Grade Fever.         WBC's Improving.               Remains on IV Antibiotics.         O2 sat 90% on NRB 14L/Min.         IVF's Discontinued.         Lasix 40mg  IV given as ordered. 4. Complications:         O2 sat 90% on NRB 14L/Min. 5. Diet:         Healthy Heart. Decreased Appetite. 6. Activity:         One person Assist to [facility].

## 2015-03-13 NOTE — Progress Notes (Signed)
PT Cancellation Note  Patient Details Name: Anita Ward MRN: [id number] DOB: 1919-11-17   Cancelled Treatment:    Reason Eval/Treat Not Completed: Other (comment) (Pain meds being given, refused for now)   Ivar Drape 03/13/2015, 12:39 PM   Samul Dada, PT MS Acute Rehab Dept. Number: [facility] [id number] and [facility] [phone]

## 2015-03-13 NOTE — Evaluation (Signed)
Physical Therapy Evaluation Patient Details Name: Anita Ward MRN: [id number] DOB: 1919/07/21 Today's Date: 03/13/2015   History of Present Illness  81 yo female with onset of LLE pain is admitted for UTI, has c-diff screen pending.  Pt also has bibasilar effusion, SOB and has been hypoxic today.    Clinical Impression  Pt was seen finally to assess her need for help to walk.  Pt is having a great deal of trouble getting up and staying up due to SOB.  Her plan is to go home to her own residence and to walk independently with no AD.   Her goals are good but wont allow for her fall risk.  Plan is to go home to avoid injury or fracture due to poor standign control compounded by respiratory issues.    Follow Up Recommendations SNF    Equipment Recommendations  Rolling walker with 5" wheels;Other (comment)    Recommendations for Other Services       Precautions / Restrictions Precautions Precautions: Fall Precaution Comments: ck O2 sats Restrictions Weight Bearing Restrictions: No      Mobility  Bed Mobility Overal bed mobility: Needs Assistance Bed Mobility: Supine to Sit;Sit to Supine     Supine to sit: Mod assist;Max assist Sit to supine: Mod assist      Transfers Overall transfer level: Needs assistance Equipment used: Rolling walker (2 wheeled);1 person hand held assist Transfers: Sit to/from [employer] Sit to Stand: Min assist Stand pivot transfers: Min guard;Min assist       General transfer comment: reminders for hand placemetn and to control   Ambulation/Gait Ambulation/Gait assistance: Min guard;Min assist Ambulation Distance (Feet): 3 Feet Assistive device: Rolling walker (2 wheeled) Gait Pattern/deviations: Step-to pattern;Decreased dorsiflexion - right;Decreased dorsiflexion - left;Trunk flexed;Leaning posteriorly;Wide base of support Gait velocity: very slow Gait velocity interpretation: Below normal speed for age/gender    Stairs            Wheelchair Mobility    Modified Rankin (Stroke Patients Only)       Balance Overall balance assessment: Needs assistance   Sitting balance-Leahy Scale: Fair     Standing balance support: Bilateral upper extremity supported Standing balance-Leahy Scale: Fair                               Pertinent Vitals/Pain Pain Assessment: Faces Faces Pain Scale: Hurts little more Pain Location: L leg Pain Descriptors / Indicators: Aching (stinging) Pain Intervention(s): Limited activity within patient's tolerance;Monitored during session;Premedicated before session;Repositioned    Home Living Family/patient expects to be discharged to:: Private residence Living Arrangements: Alone Available Help at Discharge: Friend(s);Available PRN/intermittently Type of Home: House Home Access: Stairs to enter   Entrance Stairs-Number of Steps: 2   Home Equipment: Cane - single point;Walker - 2 wheels      Prior Function Level of Independence: Independent         Comments: did not walk with AD unless she went outside     Hand Dominance        Extremity/Trunk Assessment   Upper Extremity Assessment: Generalized weakness           Lower Extremity Assessment: Generalized weakness      Cervical / Trunk Assessment: Kyphotic  Communication   Communication: HOH  Cognition Arousal/Alertness: Awake/alert Behavior During Therapy: WFL for tasks assessed/performed Overall Cognitive Status: Within Functional Limits for tasks assessed       Memory: Decreased  short-term memory              General Comments General comments (skin integrity, edema, etc.): Pt has fragile skin with edematous RUE which PT left elevated on a pillow    Exercises        Assessment/Plan    PT Assessment Patient needs continued PT services  PT Diagnosis Generalized weakness   PT Problem List Decreased strength;Decreased range of motion;Decreased activity  tolerance;Decreased balance;Decreased mobility;Decreased coordination;Decreased cognition;Decreased knowledge of use of DME;Decreased safety awareness;Decreased knowledge of precautions;Cardiopulmonary status limiting activity;Decreased skin integrity;Pain  PT Treatment Interventions DME instruction;Gait training;Stair training;Functional mobility training;Therapeutic activities;Balance training;Therapeutic exercise;Neuromuscular re-education;Cognitive remediation;Patient/family education   PT Goals (Current goals can be found in the Care Plan section) Acute Rehab PT Goals Patient Stated Goal: to get home and breathe  PT Goal Formulation: With patient Time For Goal Achievement: 03/27/15 Potential to Achieve Goals: Good    Frequency Min 2X/week   Barriers to discharge Inaccessible home environment;Decreased caregiver support home alone    Co-evaluation               End of Session Equipment Utilized During Treatment: Gait belt;Oxygen Activity Tolerance: Patient tolerated treatment well;Patient limited by fatigue Patient left: in bed;with call bell/phone within reach Nurse Communication: Mobility status         Time: 1025-8527 PT Time Calculation (min) (ACUTE ONLY): 33 min   Charges:   PT Evaluation $Initial PT Evaluation Tier I: 1 Procedure PT Treatments $Gait Training: 8-22 mins $Therapeutic Activity: 8-22 mins   PT G Codes:        Ivar Drape 04/10/2015, 3:46 PM   Samul Dada, PT MS Acute Rehab Dept. Number: [facility] [id number] and [facility] [phone]

## 2015-03-13 NOTE — Plan of Care (Signed)
Problem: Discharge Progression Outcomes Goal: Other Discharge Outcomes/Goals Plan of care progress to goal: VSS No complaints of nausea or pain Diet: ate small amount before bedtime Activity: one assist up to bedside No bm this shift

## 2015-03-13 NOTE — Progress Notes (Signed)
Anita Ward is a 81 y.o. female  UTI (urinary tract infection)   SUBJECTIVE:  Pt SOB and hypoxic on NRBM. Denies CP. Currently on Meropenem for UTI. Labs stable except K+ low.  ______________________________________________________________________  ROS: Review of systems is unremarkable for any active cardiac,respiratory, GI, GU, hematologic, neurologic or psychiatric systems, 10 systems reviewed.  @CMEDLIST @  Past Medical History  Diagnosis Date  . HTN (hypertension)   . Coronary artery disease   . Glaucoma   . Osteoporosis     Past Surgical History  Procedure Laterality Date  . Cardiac surgery    . Cardiac surgery      PHYSICAL EXAM:  BP 147/50 mmHg  Pulse 98  Temp(Src) 100 F (37.8 C) (Oral)  Resp 18  Ht 5\' 2"  (1.575 m)  Wt 65.953 kg (145 lb 6.4 oz)  BMI 26.59 kg/m2  SpO2 90%  Wt Readings from Last 3 Encounters:  03/12/15 65.953 kg (145 lb 6.4 oz)  01/07/15 63.504 kg (140 lb)            Constitutional: NAD Neck: supple, no thyromegaly Respiratory: diffuse rhonchi Cardiovascular: RRR, no murmur, no gallop Abdomen: soft, good BS, nontender Extremities: no edema Neuro: alert and oriented, no focal motor or sensory deficits  ASSESSMENT/PLAN:  Labs and imaging studies were reviewed  Add IV Zithromax and SVN's. Repeat CXR today. Cultures pending. Repeat labs in AM. PC, PT, and CSW consults placed. Will add K+ to IV fluids.

## 2015-03-13 NOTE — Progress Notes (Signed)
ANTIBIOTIC CONSULT NOTE - INITIAL  Pharmacy Consult for meropenem Indication: UTI  Allergies  Allergen Reactions  . Benicar [Olmesartan] Other (See Comments)    Reaction:  Unknown   . Cardura [Doxazosin Mesylate] Other (See Comments)    Reaction:  Unknown   . Ciprofloxacin Other (See Comments)    Reaction:  Unknown  . Fosamax [Alendronate Sodium] Other (See Comments)    Reaction:  Unknown  . Lisinopril Other (See Comments)    Reaction:  Unknown   . Norvasc [Amlodipine Besylate] Other (See Comments)    Reaction:  Unknown   . Penicillins Other (See Comments)    Reaction:  Unknown   . Sulfa Antibiotics Other (See Comments)    Reaction:  Unknown   . Torsemide Anxiety    Patient Measurements: Height: 5\' 2"  (157.5 cm) Weight: 145 lb 6.4 oz (65.953 kg) IBW/kg (Calculated) : 50.1 Adjusted Body Weight:   Vital Signs: Temp: 100 F (37.8 C) (09/03 0444) Temp Source: Oral (09/03 0444) BP: 147/50 mmHg (09/03 0444) Pulse Rate: 98 (09/03 0444) Intake/Output from previous day: 09/02 0701 - 09/03 0700 In: -  Out: 1100 [Urine:1100] Intake/Output from this shift: Total I/O In: -  Out: 1100 [Urine:1100]  Labs:  Recent Labs  03/12/15 1424  WBC 19.0*  HGB 12.0  PLT 237  CREATININE 1.73*   Estimated Creatinine Clearance: 18.1 mL/min (by C-G formula based on Cr of 1.73). No results for input(s): VANCOTROUGH, VANCOPEAK, VANCORANDOM, GENTTROUGH, GENTPEAK, GENTRANDOM, TOBRATROUGH, TOBRAPEAK, TOBRARND, AMIKACINPEAK, AMIKACINTROU, AMIKACIN in the last 72 hours.   Microbiology: Recent Results (from the past 720 hour(s))  Blood culture (routine x 2)     Status: None (Preliminary result)   Collection Time: 03/12/15  3:34 PM  Result Value Ref Range Status   Specimen Description BLOOD RIGHT ASSIST CONTROL  Final   Special Requests   Final    BOTTLES DRAWN AEROBIC AND ANAEROBIC  AER 4CC ANA 3CC   Culture  Setup Time   Final    GRAM NEGATIVE RODS ANAEROBIC BOTTLE ONLY CRITICAL  RESULT CALLED TO, READ BACK BY AND VERIFIED WITH: [facility] AT 7616 03/13/15.PMH CONFIRMED BY RW    Culture   Final    GRAM NEGATIVE RODS ANAEROBIC BOTTLE ONLY IDENTIFICATION TO FOLLOW    Report Status PENDING  Incomplete    Medical History: Past Medical History  Diagnosis Date  . HTN (hypertension)   . Coronary artery disease   . Glaucoma   . Osteoporosis     Medications:  Infusions:  . sodium chloride 75 mL/hr at 03/12/15 2335   Assessment: Anita Ward cc abdominal pain/emesis/diarrhea with multiple drug allergies and history of recurrent UTIs. ER noted UTI, pt received 1 dose of Zosyn, was switched to Levaquin but due to allergies was changed to ceftriaxone. This AM changed to [facility] for recurrent/complicated UTI.   Goal of Therapy:  Treat infection  Plan:  Expected duration 5 days with resolution of temperature and/or normalization of WBC . Meropenem 500 mg IV Q12H ordered, renally reduced for CrCl approximately 18 mL/min.    Carola Frost, Pharm.D. Clinical Pharmacist 03/13/2015,4:53 AM

## 2015-03-14 ENCOUNTER — Inpatient Hospital Stay: Payer: Medicare Other

## 2015-03-14 ENCOUNTER — Inpatient Hospital Stay
Admit: 2015-03-14 | Discharge: 2015-03-14 | Disposition: A | Discharge status: Home / Self Care | Payer: Medicare Other | Attending: Internal Medicine | Admitting: Internal Medicine

## 2015-03-14 LAB — CBC WITH DIFFERENTIAL/PLATELET
BASOS ABS: 0.1 10*3/uL (ref 0–0.1)
Basophils Relative: 0 %
EOS ABS: 0 10*3/uL (ref 0–0.7)
EOS PCT: 0 %
HCT: 36.5 % (ref 35.0–47.0)
Hemoglobin: 12.3 g/dL (ref 12.0–16.0)
LYMPHS ABS: 0.7 10*3/uL — AB (ref 1.0–3.6)
LYMPHS PCT: 4 %
MCH: 25.8 pg — AB (ref 26.0–34.0)
MCHC: 33.7 g/dL (ref 32.0–36.0)
MCV: 76.5 fL — AB (ref 80.0–100.0)
MONO ABS: 1.7 10*3/uL — AB (ref 0.2–0.9)
Monocytes Relative: 9 %
Neutro Abs: 16.3 10*3/uL — ABNORMAL HIGH (ref 1.4–6.5)
Neutrophils Relative %: 87 %
PLATELETS: 254 10*3/uL (ref 150–440)
RBC: 4.78 MIL/uL (ref 3.80–5.20)
RDW: 14.6 % — AB (ref 11.5–14.5)
WBC: 18.9 10*3/uL — AB (ref 3.6–11.0)

## 2015-03-14 LAB — URINE CULTURE

## 2015-03-14 LAB — BASIC METABOLIC PANEL
Anion gap: 7 (ref 5–15)
BUN: 16 mg/dL (ref 6–20)
CALCIUM: 7.8 mg/dL — AB (ref 8.9–10.3)
CO2: 29 mmol/L (ref 22–32)
CREATININE: 1.02 mg/dL — AB (ref 0.44–1.00)
Chloride: 97 mmol/L — ABNORMAL LOW (ref 101–111)
GFR calc Af Amer: 53 mL/min — ABNORMAL LOW (ref >60)
GFR, EST NON AFRICAN AMERICAN: 46 mL/min — AB (ref >60)
GLUCOSE: 117 mg/dL — AB (ref 65–99)
POTASSIUM: 3.2 mmol/L — AB (ref 3.5–5.1)
SODIUM: 133 mmol/L — AB (ref 135–145)

## 2015-03-14 MED ORDER — SODIUM CHLORIDE 0.9 % IV SOLN
1.0000 g | Freq: Two times a day (BID) | INTRAVENOUS | Status: DC
  Administered 2015-03-14 – 2015-03-15 (×3): 1 g via INTRAVENOUS
  Filled 2015-03-14 (×8): qty 1

## 2015-03-14 MED ORDER — POTASSIUM CHLORIDE 20 MEQ PO PACK
40.0000 meq | PACK | Freq: Three times a day (TID) | ORAL | Status: DC
  Administered 2015-03-14 – 2015-03-19 (×14): 40 meq via ORAL
  Filled 2015-03-14 (×14): qty 2

## 2015-03-14 MED ORDER — METOPROLOL TARTRATE 50 MG PO TABS
50.0000 mg | ORAL_TABLET | Freq: Four times a day (QID) | ORAL | Status: DC
  Administered 2015-03-14 – 2015-03-19 (×19): 50 mg via ORAL
  Filled 2015-03-14 (×20): qty 1

## 2015-03-14 MED ORDER — FUROSEMIDE 10 MG/ML IJ SOLN
40.0000 mg | Freq: Two times a day (BID) | INTRAMUSCULAR | Status: DC
  Administered 2015-03-14 – 2015-03-15 (×4): 40 mg via INTRAVENOUS
  Filled 2015-03-14 (×4): qty 4

## 2015-03-14 MED ORDER — INFLUENZA VAC SPLIT QUAD 0.5 ML IM SUSY
0.5000 mL | PREFILLED_SYRINGE | INTRAMUSCULAR | Status: AC
  Administered 2015-03-19: 0.5 mL via INTRAMUSCULAR
  Filled 2015-03-14 (×2): qty 0.5

## 2015-03-14 NOTE — Progress Notes (Signed)
*  PRELIMINARY RESULTS* Echocardiogram 2D Echocardiogram has been performed.  Garrel Ridgel Stills 03/14/2015, 10:51 AM

## 2015-03-14 NOTE — Clinical Social Work Note (Signed)
Clinical Social Work Assessment  Patient Details  Name: Anita Ward MRN: [id number] Date of Birth: 25-Mar-1920  Date of referral:  03/14/15               Reason for consult:  Facility Placement                Permission sought to share information with:  Chartered certified accountant granted to share information::  Yes, Verbal Permission Granted  Name::      [place]::   [place]   Relationship::     Contact Information:     Housing/Transportation Living arrangements for the past 2 months:  [place] of Information:  Patient Patient Interpreter Needed:  None Criminal Activity/Legal Involvement Pertinent to Current Situation/Hospitalization:  No - Comment as needed Significant Relationships:  Adult Children Lives with:  Self Do you feel safe going back to the place where you live?  Yes Need for family participation in patient care:  No (Coment)  Care giving concerns: Patient lives alone in [place].    Social Worker assessment / plan: Holiday representative (CSW) met with patient to discuss D/C plan. Patient was alert and oriented and had on a non-rebreather. CSW introduced self and explained role of CSW department. Patient reported that she lives in [place] alone and her son Hazelene Doten stays with her on the weekends. Patient reported that her son Barbaraann Rondo is her primary support. Patient is agreeable to SNF search in [facility]. Patient is not familiar with the facilities and has never been in one before. SNF list was provided.   FL2 complete and on chart.   Employment status:  Retired Forensic scientist:  [employer] PT Recommendations:  [place] / Referral to community resources:  [place]  Patient/Family's Response to care: Patient is agreeable to [employer] in [place].   Patient/Family's Understanding of and Emotional Response to Diagnosis, Current  Treatment, and Prognosis: Patient was pleasant throughout assessment and thanked CSW for visit.   Emotional Assessment Appearance:  Appears stated age Attitude/Demeanor/Rapport:    Affect (typically observed):  Accepting, Adaptable, Pleasant Orientation:  Oriented to Self, Oriented to Place, Oriented to  Time, Oriented to Situation Alcohol / Substance use:  Not Applicable Psych involvement (Current and /or in the community):  No (Comment)  Discharge Needs  Concerns to be addressed:  Discharge Planning Concerns Readmission within the last 30 days:  No Current discharge risk:  Chronically ill Barriers to Discharge:  Continued Medical Work up   Loralyn Freshwater, LCSW 03/14/2015, 2:37 PM

## 2015-03-14 NOTE — Clinical Social Work Placement (Signed)
   CLINICAL SOCIAL WORK PLACEMENT  NOTE  Date:  03/14/2015  Patient Details  Name: Anita Ward MRN: [id number] Date of Birth: [birth year]  Clinical Social Work is seeking post-discharge placement for this patient at the Skilled  Nursing Facility level of care (*CSW will initial, date and re-position this form in  chart as items are completed):  Yes   Patient/family provided with [place] Clinical Social Work Department's list of facilities offering this level of care within the geographic area requested by the patient (or if unable, by the patient's family).  Yes   Patient/family informed of their freedom to choose among providers that offer the needed level of care, that participate in Medicare, Medicaid or managed care program needed by the patient, have an available bed and are willing to accept the patient.  Yes   Patient/family informed of [place]'s ownership interest in [facility] and [facility], as well as of the fact that they are under no obligation to receive care at these facilities.  PASRR submitted to EDS on 03/14/15     PASRR number received on 03/14/15     Existing PASRR number confirmed on       FL2 transmitted to all facilities in geographic area requested by pt/family on 03/14/15     FL2 transmitted to all facilities within larger geographic area on       Patient informed that his/her managed care company has contracts with or will negotiate with certain facilities, including the following:            Patient/family informed of bed offers received.  Patient chooses bed at       Physician recommends and patient chooses bed at      Patient to be transferred to   on  .  Patient to be transferred to facility by       Patient family notified on   of transfer.  Name of family member notified:        PHYSICIAN Please sign FL2     Additional Comment:    _______________________________________________ Haig Prophet, LCSW 03/14/2015, 2:36  PM

## 2015-03-14 NOTE — Progress Notes (Signed)
Anita Ward is a 81 y.o. female  UTI (urinary tract infection)   SUBJECTIVE:  Pt remains SOB on NRBM. C/o abdominal pain. CXR suggests volume overload. Afebrile. Denies CP.  ______________________________________________________________________  ROS: Review of systems is unremarkable for any active cardiac,respiratory, GI, GU, hematologic, neurologic or psychiatric systems, 10 systems reviewed.  @CMEDLIST @  Past Medical History  Diagnosis Date  . HTN (hypertension)   . Coronary artery disease   . Glaucoma   . Osteoporosis     Past Surgical History  Procedure Laterality Date  . Cardiac surgery    . Cardiac surgery      PHYSICAL EXAM:  BP 165/64 mmHg  Pulse 96  Temp(Src) 98.9 F (37.2 C) (Oral)  Resp 20  Ht 5\' 2"  (1.575 m)  Wt 65.953 kg (145 lb 6.4 oz)  BMI 26.59 kg/m2  SpO2 100%  Wt Readings from Last 3 Encounters:  03/12/15 65.953 kg (145 lb 6.4 oz)  01/07/15 63.504 kg (140 lb)            Constitutional: NAD Neck: supple, no thyromegaly Respiratory: basilar rales Cardiovascular: tachycardic, no murmur, no gallop Abdomen: soft, good BS, nontender, mildly distended Extremities: no edema Neuro: alert and oriented, no focal motor or sensory deficits  ASSESSMENT/PLAN:  Labs and imaging studies were reviewed  Abdominal films today. Begin IV Lasix. Continue IV ABX. Supplement K+. PC consult pending. Wean O2 if possible. Repeat labs and CXR in AM.

## 2015-03-14 NOTE — Progress Notes (Signed)
Report given to Jackie, RN.

## 2015-03-14 NOTE — Plan of Care (Signed)
Problem: Discharge Progression Outcomes Goal: Other Discharge Outcomes/Goals Outcome: Progressing Plan of care progress to goal Discharge Plan- PT Evaluated and recommends SNF. SW Consult ordered.  Pain-pt c/o RUQ and LUQ abd pain along with left hip pain, improved with PRN pain medication. Hemodynamically Stable- VSS, WBC's Improving, IV antibiotics continue per MD order, O2 sat 90% on NRB 14L/Min,  Complications: O2 sat 90% on NRB 14L/Min, c/o anxiety, improved with PRN xanax, needing stool specimen for r/o c-dif, pt up to void and small light brown formed stool noted mixed with urine, cup at bedside awaiting next BM.  Diet-No c/o nausea or vomiting, tolerating heart healthy with decreased appetite reported. Activity-One person Assist to [facility]  A&O pt resting well this shift. C/o abd pain and left hip pain, improved with PRN pain medication. Also c/o anxiety, improved with PRN xanax. VSS, pO2 >90% on NRB at 14L/min. Pt up to [facility] with 1 assist to void, 1 BM noted mixed with urine, cup at bedside and pt reminded that we still need a stool specimen. High fall risk, bed alarm on.

## 2015-03-14 NOTE — Plan of Care (Signed)
Problem: Discharge Progression Outcomes Goal: Other Discharge Outcomes/Goals Outcome: Progressing VSS. O2 sats in the mid 90's on non rebreather. Some discomfort in RUQ, but declined pain med. Denies n/v. Poor appetite. Medium formed stool.

## 2015-03-14 NOTE — Progress Notes (Signed)
ANTIBIOTIC CONSULT NOTE - INITIAL  Pharmacy Consult for meropenem Indication: UTI/bacteremia  Allergies  Allergen Reactions  . Benicar [Olmesartan] Other (See Comments)    Reaction:  Unknown   . Cardura [Doxazosin Mesylate] Other (See Comments)    Reaction:  Unknown   . Ciprofloxacin Other (See Comments)    Reaction:  Unknown  . Fosamax [Alendronate Sodium] Other (See Comments)    Reaction:  Unknown  . Lisinopril Other (See Comments)    Reaction:  Unknown   . Norvasc [Amlodipine Besylate] Other (See Comments)    Reaction:  Unknown   . Penicillins Other (See Comments)    Reaction:  Unknown   . Sulfa Antibiotics Other (See Comments)    Reaction:  Unknown   . Torsemide Anxiety    Patient Measurements: Height: 5\' 2"  (157.5 cm) Weight: 145 lb 6.4 oz (65.953 kg) IBW/kg (Calculated) : 50.1 Adjusted Body Weight:   Vital Signs: Temp: 97.9 F (36.6 C) (09/04 0912) Temp Source: Axillary (09/04 0912) BP: 154/59 mmHg (09/04 0912) Pulse Rate: 93 (09/04 0912) Intake/Output from previous day: 09/03 0701 - 09/04 0700 In: 1073.8 [I.V.:723.8; IV Piggyback:350] Out: 1201 [Urine:1200; Stool:1] Intake/Output from this shift: Total I/O In: 285 [IV Piggyback:285] Out: -   Labs:  Recent Labs  03/12/15 1424 03/13/15 0430 03/14/15 0603  WBC 19.0* 16.7* 18.9*  HGB 12.0 11.6* 12.3  PLT 237 219 254  CREATININE 1.73* 1.47* 1.02*   Estimated Creatinine Clearance: 30.7 mL/min (by C-G formula based on Cr of 1.02). No results for input(s): VANCOTROUGH, VANCOPEAK, VANCORANDOM, GENTTROUGH, GENTPEAK, GENTRANDOM, TOBRATROUGH, TOBRAPEAK, TOBRARND, AMIKACINPEAK, AMIKACINTROU, AMIKACIN in the last 72 hours.   Microbiology: Recent Results (from the past 720 hour(s))  Blood culture (routine x 2)     Status: None (Preliminary result)   Collection Time: 03/12/15  3:34 PM  Result Value Ref Range Status   Specimen Description BLOOD LEFT ASSIST CONTROL  Final   Special Requests BOTTLES DRAWN  AEROBIC AND ANAEROBIC  1CC  Final   Culture  Setup Time   Final    GRAM NEGATIVE RODS ANAEROBIC BOTTLE ONLY CRITICAL VALUE NOTED.  VALUE IS CONSISTENT WITH PREVIOUSLY REPORTED AND CALLED VALUE. PREVIOUSLY CALLED AT 0418 03/13/15.PMH CONFIRMED BY RW    Culture   Final    GRAM NEGATIVE RODS IN BOTH AEROBIC AND ANAEROBIC BOTTLES IDENTIFICATION AND SUSCEPTIBILITIES TO FOLLOW    Report Status PENDING  Incomplete  Blood culture (routine x 2)     Status: None (Preliminary result)   Collection Time: 03/12/15  3:34 PM  Result Value Ref Range Status   Specimen Description BLOOD RIGHT ASSIST CONTROL  Final   Special Requests   Final    BOTTLES DRAWN AEROBIC AND ANAEROBIC  AER 4CC ANA 3CC   Culture  Setup Time   Final    GRAM NEGATIVE RODS ANAEROBIC BOTTLE ONLY CRITICAL RESULT CALLED TO, READ BACK BY AND VERIFIED WITH: [facility] AT 5056 03/13/15.PMH CONFIRMED BY RW    Culture   Final    GRAM NEGATIVE RODS IN BOTH AEROBIC AND ANAEROBIC BOTTLES IDENTIFICATION AND SUSCEPTIBILITIES TO FOLLOW    Report Status PENDING  Incomplete  Urine culture     Status: None (Preliminary result)   Collection Time: 03/12/15  4:33 PM  Result Value Ref Range Status   Specimen Description URINE, RANDOM  Final   Special Requests NONE  Final   Culture   Final    >=100,000 COLONIES/mL GRAM NEGATIVE RODS IDENTIFICATION AND SUSCEPTIBILITIES TO FOLLOW  Report Status PENDING  Incomplete      Assessment: Pharmacy consulted to dose meropenem for UTI in this 89 yof cc abdominal pain/emesis/diarrhea with multiple drug allergies and history of recurrent UTIs.   Patient now with GNR in blood cultures x 2, urine cultures > 100k GNR. ID/sens pending.   Plan:  Current orders for meropenem  IV Q12H, will increase to 1gm Q12H due to improved renal function as well as bacteremia.  Pharmacy to follow per consult  Garlon Hatchet, PharmD Clinical Pharmacist  03/14/2015,1:28 PM

## 2015-03-15 ENCOUNTER — Inpatient Hospital Stay: Payer: Medicare Other

## 2015-03-15 DIAGNOSIS — A419 Sepsis, unspecified organism: Secondary | ICD-10-CM

## 2015-03-15 DIAGNOSIS — R1010 Upper abdominal pain, unspecified: Secondary | ICD-10-CM

## 2015-03-15 DIAGNOSIS — I509 Heart failure, unspecified: Secondary | ICD-10-CM | POA: Insufficient documentation

## 2015-03-15 DIAGNOSIS — J189 Pneumonia, unspecified organism: Secondary | ICD-10-CM

## 2015-03-15 DIAGNOSIS — J9600 Acute respiratory failure, unspecified whether with hypoxia or hypercapnia: Secondary | ICD-10-CM

## 2015-03-15 DIAGNOSIS — N390 Urinary tract infection, site not specified: Secondary | ICD-10-CM

## 2015-03-15 DIAGNOSIS — R410 Disorientation, unspecified: Secondary | ICD-10-CM

## 2015-03-15 DIAGNOSIS — Z515 Encounter for palliative care: Secondary | ICD-10-CM

## 2015-03-15 DIAGNOSIS — Z66 Do not resuscitate: Secondary | ICD-10-CM

## 2015-03-15 DIAGNOSIS — R531 Weakness: Secondary | ICD-10-CM

## 2015-03-15 DIAGNOSIS — D72828 Other elevated white blood cell count: Secondary | ICD-10-CM

## 2015-03-15 DIAGNOSIS — Z9981 Dependence on supplemental oxygen: Secondary | ICD-10-CM

## 2015-03-15 DIAGNOSIS — R110 Nausea: Secondary | ICD-10-CM

## 2015-03-15 LAB — CULTURE, BLOOD (ROUTINE X 2)

## 2015-03-15 LAB — CBC WITH DIFFERENTIAL/PLATELET
BASOS ABS: 0 10*3/uL (ref 0–0.1)
Basophils Relative: 0 %
EOS ABS: 0 10*3/uL (ref 0–0.7)
Eosinophils Relative: 0 %
HCT: 39 % (ref 35.0–47.0)
HEMOGLOBIN: 13 g/dL (ref 12.0–16.0)
LYMPHS ABS: 1.2 10*3/uL (ref 1.0–3.6)
LYMPHS PCT: 5 %
MCH: 25.7 pg — ABNORMAL LOW (ref 26.0–34.0)
MCHC: 33.3 g/dL (ref 32.0–36.0)
MCV: 77.2 fL — ABNORMAL LOW (ref 80.0–100.0)
Monocytes Absolute: 2.2 10*3/uL — ABNORMAL HIGH (ref 0.2–0.9)
Monocytes Relative: 9 %
NEUTROS ABS: 20.8 10*3/uL — AB (ref 1.4–6.5)
Neutrophils Relative %: 86 %
Platelets: 324 10*3/uL (ref 150–440)
RBC: 5.04 MIL/uL (ref 3.80–5.20)
RDW: 14.8 % — AB (ref 11.5–14.5)
WBC: 24.2 10*3/uL — ABNORMAL HIGH (ref 3.6–11.0)

## 2015-03-15 LAB — BASIC METABOLIC PANEL
Anion gap: 8 (ref 5–15)
BUN: 14 mg/dL (ref 6–20)
CALCIUM: 8.4 mg/dL — AB (ref 8.9–10.3)
CO2: 28 mmol/L (ref 22–32)
CREATININE: 1.01 mg/dL — AB (ref 0.44–1.00)
Chloride: 95 mmol/L — ABNORMAL LOW (ref 101–111)
GFR, EST AFRICAN AMERICAN: 54 mL/min — AB (ref >60)
GFR, EST NON AFRICAN AMERICAN: 46 mL/min — AB (ref >60)
Glucose, Bld: 122 mg/dL — ABNORMAL HIGH (ref 65–99)
Potassium: 4.7 mmol/L (ref 3.5–5.1)
SODIUM: 131 mmol/L — AB (ref 135–145)

## 2015-03-15 MED ORDER — ONDANSETRON HCL 4 MG/2ML IJ SOLN
4.0000 mg | Freq: Four times a day (QID) | INTRAMUSCULAR | Status: DC | PRN
  Administered 2015-03-15: 13:00:00 4 mg via INTRAVENOUS
  Filled 2015-03-15: qty 2

## 2015-03-15 MED ORDER — ENOXAPARIN SODIUM 40 MG/0.4ML ~~LOC~~ SOLN
40.0000 mg | SUBCUTANEOUS | Status: DC
Start: 2015-03-15 — End: 2015-03-18
  Administered 2015-03-15 – 2015-03-17 (×3): 40 mg via SUBCUTANEOUS
  Filled 2015-03-15 (×4): qty 0.4

## 2015-03-15 MED ORDER — MORPHINE SULFATE (PF) 2 MG/ML IV SOLN
2.0000 mg | INTRAVENOUS | Status: DC | PRN
  Administered 2015-03-15: 2 mg via INTRAVENOUS
  Filled 2015-03-15: qty 1

## 2015-03-15 MED ORDER — DEXTROSE 5 % IV SOLN: 1.0000 g | INTRAVENOUS | Status: DC

## 2015-03-15 NOTE — Care Management (Addendum)
Admitted to this facility with the diagnosis of urinary tract infection. Lives alone Son is Yasuko Pozo, he is a resident of a group home.  Another contact is Ricky Ala [phone]). Sees Dr. Marcello Fennel, next appointment is scheduled for 04/07/15. Also sees Dr. Gwen Pounds for heart problems. Non-re-breather mask in place. WBC's 24.0 this morning, IV Meropenum, rocephin and Lasix.  Low grade temperature of 99.4.  Palliative Care consult in place. Physical therapy evaluation completed. Recommending skilled nursing facility. Gwenette Greet RN MSN Care Management [phone]

## 2015-03-15 NOTE — Consult Note (Signed)
Palliative Medicine Inpatient Consult Note   Name: Anita Ward Date: 03/15/2015 MRN: [id number]  DOB: [birth year]  Referring Physician: Barbette Reichmann, MD  Palliative Care consult requested for this 81 y.o. female for goals of medical therapy in patient with sepsis, UTI, Pneumonia, CHF, abdominal and chest pain, and acute respiratory failure on Hi Flow Oxygen.  Today, her Oxygen requirement dropped slightly, but her WBC went up quite a bit (not on steroids).   Pt was full code.  She is confused enough that family is being asked today about code status and other palliative matters.    TODAY'S CONVERSATIONS, EVENTS, AND PLANS: 1.  I spoke for an hour with pt's son Deniece Portela, and his wife.  I educated them about the patient's many medical conditions and the natural course and issues surrounding these.  I talked to them about the challenges associated with her being on Hi Flow oxygen (meaning she cannot go to Skilled Care OR even to Hospice Home while still on Hi Flow since no place other than a hospital can provide these truckload of oxygen per minute she is requiring currently).    2.  We do not have an advanced directive from pt that would direct [place] to withdraw support and allow death to occur in the next day or so under the circumstances present at this time.   Also, if we proposed this to patient, son, Deniece Portela, says she would never agree to this as she is terrified of dying.   It might come to this if she worsens, but we cannot opt to do this today.   3.  We are therefore looking at continuing Hi Flow O2 until this can be weaned, if possible.  4. If she cannot be weaned, we will discuss terminal comfort care --possibly in Hospice Home.  We need to give it 2 to 3 more days and need to be working closely with RT.  5.  If she is weaned from Hi Flow then she will likely go to a SNF for SHORT TERM REHAB (Medicare plus [employer] ).  She can have a palliative care consult while in a SNF under rehab  status (Medicare days).  6.  If she is unable to go for rehab due to being too weak, too ill, or refusing PT, etc, then she could go to a SNF under LONG TERM CARE status (private pay) and then she could have Hospice there at the same time.  She is more bedbound now and not a good candidate for ALF so that isn't really an option for level of care.  And going to her home or someone else's home after this hospital stay is not thought to be an option per son, Deniece Portela.    7.  She is changed to DNR.  I got quick consent on this from son, [place].  I mentioned it to patient and she agreed it was [facility] to let her go if she was dead.  But, she wants to be treated and get better and that is her focus.    8. She needs more symptom management. She is hurting in her chest (and upper abdomen) and she is nauseated.  I will enter a few orders to reduce air hunger as well as a few other orders pertinent to symptom control.       REVIEW OF SYSTEMS:  Patient is not able to provide ROS --due to intermittent confusion.    SPIRITUAL SUPPORT SYSTEM: Yes  -family supportive.  SOCIAL  HISTORY:  reports that she has never smoked. She does not have any smokeless tobacco history on file. She reports that she does not drink alcohol or use illicit drugs.   Lives alone.  Has a mentally challenged son, Latonya Nelon, who lives in a group home during the week and who comes and stays with her on weekends. He has the mental capaciity of a 65 yr old (per son Deniece Portela). We are not to call him.    Her other son, Ricky Ala, is the primary decision maker, but he lives in [place], has some health issues and also a business and that keeps him from coming here often (though he is here today).  Wayne's wife is also still battling breast cancer and breast cancer treatment effects and they cannot have pt live with them due to these issues.  Pt had another son that died and that son's son, Dannielle Huh, is also attentive to patient.  He used to live with  her until recently when he got married and moved out.  Pt has a friend who is 4 yrs old who drives her to her many and frequent outpatient doctor appointments.  Sees Dr Marcello Fennel (often per son) and also Dr. Gwen Pounds for heart disease problems.   Pt has a nephew who will be executor in a will she has signed but no one seems to be aware of any HCPOA or Living Will documents being in existence.   LEGAL DOCUMENTS:  None found in paper chart  CODE STATUS: Full code at this time  PAST MEDICAL HISTORY: Past Medical History  Diagnosis Date  . HTN (hypertension)   . Coronary artery disease   . Glaucoma   . Osteoporosis     PAST SURGICAL HISTORY:  Past Surgical History  Procedure Laterality Date  . Cardiac surgery    . Cardiac surgery      ALLERGIES:  is allergic to benicar; cardura; ciprofloxacin; fosamax; lisinopril; norvasc; penicillins; sulfa antibiotics; and torsemide.  MEDICATIONS:  Current Facility-Administered Medications  Medication Dose Route Frequency Provider Last Rate Last Dose  . acetaminophen (TYLENOL) tablet 650 mg  650 mg Oral Q6H PRN Altamese Dilling, MD   650 mg at 03/12/15 1859  . ALPRAZolam Prudy Feeler) tablet 0.25 mg  0.25 mg Oral BID PRN Altamese Dilling, MD   0.25 mg at 03/14/15 2006  . azithromycin (ZITHROMAX) 500 mg in dextrose 5 % 250 mL IVPB  500 mg Intravenous Q24H Marguarite Arbour, MD   500 mg at 03/14/15 0948  . calcium-vitamin D (OSCAL WITH D) 500-200 MG-UNIT per tablet 1 tablet  1 tablet Oral BID Altamese Dilling, MD   1 tablet at 03/14/15 2251  . enoxaparin (LOVENOX) injection 40 mg  40 mg Subcutaneous Q24H Vishwanath Hande, MD      . furosemide (LASIX) injection 40 mg  40 mg Intravenous Q12H Marguarite Arbour, MD   40 mg at 03/14/15 2005  . Influenza vac split quadrivalent PF (FLUARIX) injection 0.5 mL  0.5 mL Intramuscular Tomorrow-1000 Melissa D Maccia, RPH      . ipratropium-albuterol (DUONEB) 0.5-2.5 (3) MG/3ML nebulizer solution 3 mL  3 mL  Nebulization QID Marguarite Arbour, MD   3 mL at 03/15/15 0854  . isosorbide mononitrate (IMDUR) 24 hr tablet 30 mg  30 mg Oral BID Altamese Dilling, MD   30 mg at 03/14/15 2251  . levothyroxine (SYNTHROID, LEVOTHROID) tablet 25 mcg  25 mcg Oral QAC breakfast Altamese Dilling, MD   25 mcg at 03/14/15  4132  . magnesium oxide (MAG-OX) tablet 400 mg  400 mg Oral BID Altamese Dilling, MD   400 mg at 03/14/15 2251  . meropenem (MERREM) 1 g in sodium chloride 0.9 % 100 mL IVPB  1 g Intravenous Q12H Vishwanath Hande, MD   1 g at 03/15/15 0623  . metoprolol (LOPRESSOR) tablet 50 mg  50 mg Oral Q6H Marguarite Arbour, MD   50 mg at 03/15/15 0249  . NIFEdipine (PROCARDIA-XL/ADALAT CC) 24 hr tablet 30 mg  30 mg Oral Daily Altamese Dilling, MD   30 mg at 03/14/15 0917  . nitroGLYCERIN (NITROSTAT) SL tablet 0.4 mg  0.4 mg Sublingual Q5 min PRN Altamese Dilling, MD      . ondansetron (ZOFRAN) injection 4 mg  4 mg Intravenous Q6H PRN Altamese Dilling, MD      . pantoprazole (PROTONIX) EC tablet 40 mg  40 mg Oral Daily Altamese Dilling, MD   40 mg at 03/14/15 0917  . potassium chloride (KLOR-CON) packet 40 mEq  40 mEq Oral TID Marguarite Arbour, MD   40 mEq at 03/14/15 2251  . traMADol (ULTRAM) tablet 50 mg  50 mg Oral TID PRN Altamese Dilling, MD   50 mg at 03/14/15 1841  . vitamin B-12 (CYANOCOBALAMIN) tablet 1,000 mcg  1,000 mcg Oral Daily Altamese Dilling, MD   1,000 mcg at 03/14/15 0917    Vital Signs: BP 163/66 mmHg  Pulse 97  Temp(Src) 98.1 F (36.7 C) (Oral)  Resp 18  Ht 5\' 2"  (1.575 m)  Wt 65.953 kg (145 lb 6.4 oz)  BMI 26.59 kg/m2  SpO2 93% Filed Weights   03/12/15 1355 03/12/15 2018  Weight: 61.236 kg (135 lb) 65.953 kg (145 lb 6.4 oz)    Estimated body mass index is 26.59 kg/(m^2) as calculated from the following:   Height as of this encounter: 5\' 2"  (1.575 m).   Weight as of this encounter: 65.953 kg (145 lb 6.4 oz).  PERFORMANCE STATUS (ECOG) :  4 - Bedbound  PHYSICAL EXAM: Currently on Hi Flow Oxygen --having been on nonrebreather mask earlier Alert but confused today EOMI OP clear Neck with tr JVD Heart rrr no murmur heard Lungs with decreased BS bases bilat Abd very protuberant --quite full but not firm Ext no mottling noted    LABS: CBC:    Component Value Date/Time   WBC 24.2* 03/15/2015 0500   WBC 9.1 01/27/2014 1810   HGB 13.0 03/15/2015 0500   HGB 14.0 01/27/2014 1810   HCT 39.0 03/15/2015 0500   HCT 41.8 01/27/2014 1810   PLT 324 03/15/2015 0500   PLT 319 01/27/2014 1810   MCV 77.2* 03/15/2015 0500   MCV 80 01/27/2014 1810   NEUTROABS 20.8* 03/15/2015 0500   NEUTROABS 7.6* 11/12/2012 2330   LYMPHSABS 1.2 03/15/2015 0500   LYMPHSABS 1.9 11/12/2012 2330   MONOABS 2.2* 03/15/2015 0500   MONOABS 1.1* 11/12/2012 2330   EOSABS 0.0 03/15/2015 0500   EOSABS 0.3 11/12/2012 2330   BASOSABS 0.0 03/15/2015 0500   BASOSABS 0.1 11/12/2012 2330   Comprehensive Metabolic Panel:    Component Value Date/Time   NA 131* 03/15/2015 0500   NA 136 01/27/2014 1810   K 4.7 03/15/2015 0500   K 3.4* 01/27/2014 1810   CL 95* 03/15/2015 0500   CL 97* 01/27/2014 1810   CO2 28 03/15/2015 0500   CO2 31 01/27/2014 1810   BUN 14 03/15/2015 0500   BUN 14 01/27/2014 1810   CREATININE 1.01*  03/15/2015 0500   CREATININE 1.12 01/27/2014 1810   GLUCOSE 122* 03/15/2015 0500   GLUCOSE 103* 01/27/2014 1810   CALCIUM 8.4* 03/15/2015 0500   CALCIUM 9.2 01/27/2014 1810   AST 40 03/12/2015 1424   AST 22 05/26/2012 1800   ALT 20 03/12/2015 1424   ALT 18 05/26/2012 1800   ALKPHOS 75 03/12/2015 1424   ALKPHOS 73 05/26/2012 1800   BILITOT 0.8 03/12/2015 1424   BILITOT 0.4 05/26/2012 1800   PROT 6.9 03/12/2015 1424   PROT 7.7 05/26/2012 1800   ALBUMIN 3.0* 03/12/2015 1424   ALBUMIN 3.5 05/26/2012 1800     TESTS: Echo 03/14/15 = pending  CXR 03/14/15 IMPRESSION: No evidence of acute abnormality. Moderate colonic  stool.   CXR 03/15/15: Slightly increased left pleural effusion otherwise unchanged appearance of the chest with interstitial pulmonary edema, right lower lung atelectasis/ airspace disease, left lower lung consolidations/atelectasis and cardiomegaly.  Bld Cxs neg to date X2 from 9/2  Urine Cx pos for  ecoli from 9/2   IMPRESSION: 1.  UTI with associated abdominal pain, vomiting, diarrhea, chills, weakness and urinary incontinence (not a problem baseline) (given Zosyn x1 dose, then changed to Levaquin, but due to allergy to Cipro was changed to ceftriaxone and then on 9/3 was changed to [facility] for recurrent UTI.  E coli is sens to ceftriaxone.  Fever,  Chills, etc = SIRS / sepsis 2.  Profound leukocytosis (19 - 16- 19- 24) 3.  Acute respiratory failure ---CXR on 9/3 was negative  ---CXR on  9/4 shows effusion on left, interstitial pulmonary edema, and right lung infiltrate/ atelectasis and cardiomegaly ---echo was done 9/4 and result is pending --- she likely has restrictive lung disease due to thoracic kyphosis from osteoporosis 4.  CHF --acute but echo is pending so cannot characterize further ---lasix given 5.  Pneumonia --on merrem 6.  Pleural Effusions 7.  HTN 8.  CAD 9.  Glaucoma 10. Osteoporosis ---with thoracic kyphosis 11.  Acute on chronic renal failure (baseline CKD degree not clear but likely) 12.  Dehydration --corrected 13.  Hypokalemia --corrected 14.  Diarrhea --in the setting of chronic constipation 15. Hyperglycemia 16. Hyponatremia    See top of note for plan  More than 50% of the visit was spent in counseling/coordination of care: Yes  Time Spent: 70 minutes

## 2015-03-15 NOTE — Plan of Care (Signed)
Problem: Discharge Progression Outcomes Goal: Other Discharge Outcomes/Goals Outcome: Progressing Plan of care progress to goal  Discharge Plan- PT Evaluated and recommends SNF. SW Consult ordered.    Pain-pt c/o RUQ and LUQ abd pain along with left hip pain, improved with PRN pain medication. Hemodynamically Stable- VSS, WBC's Improving, IV antibiotics continue per MD order, O2 sat 90% on NRB 14L/Min. Weaned to HFNC 80%. Tolerating Well.  Complications: O2 sat 90% on NRB 14L/Min, c/o anxiety, improved with PRN xanax, O2 sat Improved. Weaned to HFNC 80%.    Diet-Healthy Heart. Decreased Diet. Tolerating Well. No c/o nausea or vomiting.  Activity-One person Assist to [facility].

## 2015-03-15 NOTE — Clinical Social Work Note (Signed)
CSW spoke to [facility] and pt's family.  Pt is currently too acute to for SNF.  CSW aware that palliative is speaking with pt's family.  CSW will continue to follow continue SNF placement if pt is appropriate.

## 2015-03-15 NOTE — Progress Notes (Signed)
Anita Ward is a 81 y.o. female  UTI (urinary tract infection)   SUBJECTIVE:  Pt in NAD but remains hypoxic requiring O2 via NRBM. Confused. CXR suggests pneumonia and CHF. Afebrile. WBC much worse this AM. Culture positive for E.Coli. Palliative Care still has not seen patient.  ______________________________________________________________________  ROS: Review of systems is unremarkable for any active cardiac,respiratory, GI, GU, hematologic, neurologic or psychiatric systems, 10 systems reviewed.  @CMEDLIST @  Past Medical History  Diagnosis Date  . HTN (hypertension)   . Coronary artery disease   . Glaucoma   . Osteoporosis     Past Surgical History  Procedure Laterality Date  . Cardiac surgery    . Cardiac surgery      PHYSICAL EXAM:  BP 163/66 mmHg  Pulse 97  Temp(Src) 98.1 F (36.7 C) (Oral)  Resp 18  Ht 5\' 2"  (1.575 m)  Wt 65.953 kg (145 lb 6.4 oz)  BMI 26.59 kg/m2  SpO2 93%  Wt Readings from Last 3 Encounters:  03/12/15 65.953 kg (145 lb 6.4 oz)  01/07/15 63.504 kg (140 lb)            Constitutional: NAD Neck: supple, no thyromegaly Respiratory: diffuse rhonchi and rales Cardiovascular: RRR, no murmur, no gallop Abdomen: soft, good BS, nontender Extremities: no edema Neuro: alert but disoriented, no focal motor or sensory deficits  ASSESSMENT/PLAN:  Labs and imaging studies were reviewed  Will add IV Rocephin. Continue IV Lasix. Pulmonology consult today. Repeat labs in AM. Prognosis poor. CXR tomorrow.

## 2015-03-15 NOTE — Plan of Care (Signed)
Problem: Discharge Progression Outcomes Goal: Other Discharge Outcomes/Goals Outcome: Progressing Plan of care progress to goal Discharge Plan- PT Evaluated and recommends SNF. SW Consult ordered.   Pain-pt c/o RUQ and LUQ abd pain along with left hip pain, improved with PRN pain medication. Hemodynamically Stable- VSS, WBC's Improving, IV antibiotics continue per MD order, O2 sat 90% on NRB 14L/Min,   Complications: O2 sat 90% on NRB 14L/Min, c/o anxiety, improved with PRN xanax, needing stool specimen for r/o c-dif, pt up to void and small light brown formed stool noted mixed with urine, cup at bedside awaiting next BM.   Diet-No c/o nausea or vomiting, tolerating heart healthy with decreased appetite reported. Activity-One person Assist to [facility].  A&O pt resting well this shift. C/o abd pain, improved with PRN pain medication. Also c/o anxiety, improved with PRN xanax. VSS, pO2 >90% on NRB at 14L/min. Pt up to [facility] with 1 assist to void, 1 BM noted mixed with urine, cup at bedside and pt reminded that we still need a stool specimen. High fall risk, bed alarm on.

## 2015-03-15 NOTE — Progress Notes (Signed)
81 y/o F on Lovenox 30 mg daily for DVT prophylaxis.   SCr: 1.01, CrCl: 31 ml/min  As SCr has decreased since admission, will increase Lovenox to 40 mg daily.   Luisa Hart, PharmD

## 2015-03-15 NOTE — Progress Notes (Signed)
   03/15/15 1310  Clinical Encounter Type  Visited With Patient and family together  Visit Type Initial  Referral From Nurse  Consult/Referral To Chaplain  Spiritual Encounters  Spiritual Needs Literature  Advance Directives (For Healthcare)  Does patient have an advance directive? No  Would patient like information on creating an advanced directive? Yes - Transport planner given  Provided Advance Directive education and forms given.  Family wishes to discuss and complete later tonight or tomorrow.  Patient appears to have capacity, but I am not convinced that patient has it all the time and uncertain if patient fully understands what a HCPOA is.  Pt kept asking family member that she desires for a relative to be taken care of and that "the funeral is paid for."  May be confusing HCPOA with financial POA.  Family member and I agree to follow up with patient and family tomorrow in order to make decisions on whether or not to complete A.D. [employer] Mylon Mabey-pager [phone]

## 2015-03-15 NOTE — Care Management Important Message (Signed)
Important Message  Patient Details  Name: Anita Ward MRN: [id number] Date of Birth: 11-23-1919   Medicare Important Message Given:  Yes-second notification given    Gwenette Greet, RN 03/15/2015, 9:37 AM

## 2015-03-16 ENCOUNTER — Inpatient Hospital Stay: Payer: Medicare Other

## 2015-03-16 DIAGNOSIS — N3000 Acute cystitis without hematuria: Secondary | ICD-10-CM

## 2015-03-16 LAB — CBC WITH DIFFERENTIAL/PLATELET
Basophils Absolute: 0.1 10*3/uL (ref 0–0.1)
Basophils Relative: 1 %
Eosinophils Absolute: 0.1 10*3/uL (ref 0–0.7)
Eosinophils Relative: 1 %
HEMATOCRIT: 38.3 % (ref 35.0–47.0)
Hemoglobin: 12.5 g/dL (ref 12.0–16.0)
LYMPHS ABS: 1.4 10*3/uL (ref 1.0–3.6)
MCH: 25.2 pg — AB (ref 26.0–34.0)
MCHC: 32.6 g/dL (ref 32.0–36.0)
MCV: 77.2 fL — AB (ref 80.0–100.0)
MONO ABS: 2.2 10*3/uL — AB (ref 0.2–0.9)
NEUTROS ABS: 16.6 10*3/uL — AB (ref 1.4–6.5)
Neutrophils Relative %: 80 %
Platelets: 401 10*3/uL (ref 150–440)
RBC: 4.97 MIL/uL (ref 3.80–5.20)
RDW: 14.9 % — AB (ref 11.5–14.5)
WBC: 20.5 10*3/uL — ABNORMAL HIGH (ref 3.6–11.0)

## 2015-03-16 LAB — C DIFFICILE QUICK SCREEN W PCR REFLEX
C Diff antigen: NEGATIVE
C Diff interpretation: NEGATIVE
C Diff toxin: NEGATIVE

## 2015-03-16 LAB — BASIC METABOLIC PANEL
ANION GAP: 7 (ref 5–15)
BUN: 14 mg/dL (ref 6–20)
CALCIUM: 8.6 mg/dL — AB (ref 8.9–10.3)
CO2: 30 mmol/L (ref 22–32)
CREATININE: 0.92 mg/dL (ref 0.44–1.00)
Chloride: 95 mmol/L — ABNORMAL LOW (ref 101–111)
GFR calc Af Amer: >60 mL/min (ref >60)
GFR calc non Af Amer: 52 mL/min — ABNORMAL LOW (ref >60)
GLUCOSE: 92 mg/dL (ref 65–99)
Potassium: 4.6 mmol/L (ref 3.5–5.1)
Sodium: 132 mmol/L — ABNORMAL LOW (ref 135–145)

## 2015-03-16 MED ORDER — AZITHROMYCIN 250 MG PO TABS
500.0000 mg | ORAL_TABLET | Freq: Every day | ORAL | Status: DC
  Administered 2015-03-16: 13:00:00 500 mg via ORAL
  Filled 2015-03-16: qty 2

## 2015-03-16 MED ORDER — HALOPERIDOL LACTATE 5 MG/ML IJ SOLN
5.0000 mg | Freq: Once | INTRAMUSCULAR | Status: AC
  Administered 2015-03-16: 5 mg via INTRAVENOUS

## 2015-03-16 MED ORDER — HALOPERIDOL LACTATE 5 MG/ML IJ SOLN
INTRAMUSCULAR | Status: AC
  Filled 2015-03-16: qty 1

## 2015-03-16 MED ORDER — MORPHINE SULFATE 10 MG/5ML PO SOLN
2.5000 mg | ORAL | Status: DC | PRN
  Filled 2015-03-16 (×2): qty 5
  Filled 2015-03-16: qty 1

## 2015-03-16 MED ORDER — FUROSEMIDE 40 MG PO TABS
40.0000 mg | ORAL_TABLET | Freq: Every day | ORAL | Status: DC
  Administered 2015-03-17 – 2015-03-19 (×3): 40 mg via ORAL
  Filled 2015-03-16 (×3): qty 1

## 2015-03-16 MED ORDER — IPRATROPIUM-ALBUTEROL 0.5-2.5 (3) MG/3ML IN SOLN
3.0000 mL | Freq: Three times a day (TID) | RESPIRATORY_TRACT | Status: DC
  Administered 2015-03-16 – 2015-03-19 (×9): 3 mL via RESPIRATORY_TRACT
  Filled 2015-03-16 (×10): qty 3

## 2015-03-16 MED ORDER — FUROSEMIDE 40 MG PO TABS
40.0000 mg | ORAL_TABLET | Freq: Two times a day (BID) | ORAL | Status: DC
  Administered 2015-03-16: 40 mg via ORAL
  Filled 2015-03-16: qty 1

## 2015-03-16 MED ORDER — PROCHLORPERAZINE MALEATE 10 MG PO TABS: 5.0000 mg | ORAL_TABLET | Freq: Three times a day (TID) | ORAL | Status: DC | PRN

## 2015-03-16 NOTE — Consult Note (Signed)
[facility] [place] Pulmonary Medicine Consultation     Date: 03/16/2015,   MRN# [id number] Anita Ward 24-Jun-1920 Code Status:     Code Status Orders        Start     Ordered   03/15/15 1112  Do not attempt resuscitation (DNR)   Continuous    Question Answer Comment  In the event of cardiac or respiratory ARREST Do not call a "code blue"   In the event of cardiac or respiratory ARREST Do not perform Intubation, CPR, defibrillation or ACLS   In the event of cardiac or respiratory ARREST Use medication by any route, position, wound care, and other measures to relive pain and suffering. May use oxygen, suction and manual treatment of airway obstruction as needed for comfort.      03/15/15 1112     Hosp day:@LENGTHOFSTAYDAYS @ Referring MD: @ATDPROV @     PCP:      AdmissionWeight: 135 lb (61.236 kg)                 CurrentWeight: 145 lb 6.4 oz (65.953 kg) Anita Ward is a 81 y.o. old female seen in consultation for resp distress  CC/HPI  CC: resp distress CC CC: resp distress  81 yo white female seen today for resp failure, patient noted to have been on 100% NRB mask, this am patient is on 4 L [place], resting and breathing comfortably, patient admitted with sepsis. Patient lethargic but arousable, patient noted to have increased WOB and SOB, according to family at bedisde, patient looked really bad.   Patient  with a known history of hypertension, coronary artery disease, osteoporosis, glaucoma also has been feeling nauseated she also vomited 2-3 times and had developed loose stool for last 1 week. She also had the chills and generalized weakness, she also lost control on her bladder while rushing to the bathroom once in last few days. she was treated for UTI in the last 1-2 month but does not remember exactly when and which antibiotic.    MEDICATIONS    Home Medication:  No current outpatient prescriptions on file.  Current Medication:   Current facility-administered  medications:  .  acetaminophen (TYLENOL) tablet 650 mg, 650 mg, Oral, Q6H PRN, Altamese Dilling, MD, 650 mg at 03/12/15 1859 .  ALPRAZolam Prudy Feeler) tablet 0.25 mg, 0.25 mg, Oral, BID PRN, Altamese Dilling, MD, 0.25 mg at 03/15/15 2100 .  azithromycin (ZITHROMAX) tablet 500 mg, 500 mg, Oral, Daily, Suan Halter, MD .  enoxaparin (LOVENOX) injection 40 mg, 40 mg, Subcutaneous, Q24H, Vishwanath Hande, MD, 40 mg at 03/15/15 2300 .  furosemide (LASIX) injection 40 mg, 40 mg, Intravenous, Q12H, Marguarite Arbour, MD, 40 mg at 03/15/15 2132 .  furosemide (LASIX) tablet 40 mg, 40 mg, Oral, BID, Suan Halter, MD .  Influenza vac split quadrivalent PF (FLUARIX) injection 0.5 mL, 0.5 mL, Intramuscular, Tomorrow-1000, Melissa D Maccia, RPH, 0.5 mL at 03/15/15 1000 .  ipratropium-albuterol (DUONEB) 0.5-2.5 (3) MG/3ML nebulizer solution 3 mL, 3 mL, Nebulization, TID, Vishwanath Hande, MD .  isosorbide mononitrate (IMDUR) 24 hr tablet 30 mg, 30 mg, Oral, BID, Altamese Dilling, MD, 30 mg at 03/16/15 0740 .  levothyroxine (SYNTHROID, LEVOTHROID) tablet 25 mcg, 25 mcg, Oral, QAC breakfast, Altamese Dilling, MD, 25 mcg at 03/16/15 0741 .  meropenem (MERREM) 1 g in sodium chloride 0.9 % 100 mL IVPB, 1 g, Intravenous, Q12H, Barbette Reichmann, MD, Stopped at 03/16/15 0554 .  metoprolol (LOPRESSOR) tablet 50 mg, 50 mg,  Oral, Q6H, Marguarite Arbour, MD, 50 mg at 03/16/15 0741 .  morphine CONCENTRATE 10 MG/0.5ML oral solution 2.6 mg, 2.6 mg, Oral, Q4H PRN, Suan Halter, MD .  NIFEdipine (PROCARDIA-XL/ADALAT CC) 24 hr tablet 30 mg, 30 mg, Oral, Daily, Altamese Dilling, MD, 30 mg at 03/16/15 0741 .  nitroGLYCERIN (NITROSTAT) SL tablet 0.4 mg, 0.4 mg, Sublingual, Q5 min PRN, Altamese Dilling, MD .  ondansetron (ZOFRAN) injection 4 mg, 4 mg, Intravenous, Q6H PRN, Suan Halter, MD, 4 mg at 03/15/15 1246 .  pantoprazole (PROTONIX) EC tablet 40 mg, 40 mg, Oral, Daily,  Altamese Dilling, MD, 40 mg at 03/16/15 0741 .  potassium chloride (KLOR-CON) packet 40 mEq, 40 mEq, Oral, TID, Marguarite Arbour, MD, 40 mEq at 03/16/15 0741 .  prochlorperazine (COMPAZINE) tablet 5 mg, 5 mg, Oral, Q8H PRN, Suan Halter, MD .  traMADol Janean Sark) tablet 50 mg, 50 mg, Oral, TID PRN, Altamese Dilling, MD, 50 mg at 03/16/15 0236     ALLERGIES   Benicar; Cardura; Ciprofloxacin; Fosamax; Lisinopril; Norvasc; Penicillins; Sulfa antibiotics; and Torsemide     REVIEW OF SYSTEMS   Review of Systems  Unable to perform ROS: mental acuity     VS: BP 163/61 mmHg  Pulse 96  Temp(Src) 97.7 F (36.5 C) (Oral)  Resp 18  Ht 5\' 2"  (1.575 m)  Wt 145 lb 6.4 oz (65.953 kg)  BMI 26.59 kg/m2  SpO2 96%     PHYSICAL EXAM   Physical Exam  Constitutional: No distress.  HENT:  Head: Normocephalic and atraumatic.  Mouth/Throat: No oropharyngeal exudate.  Eyes: EOM are normal. Pupils are equal, round, and reactive to light. No scleral icterus.  Neck: Normal range of motion. Neck supple.  Cardiovascular: Normal rate, regular rhythm and normal heart sounds.   No murmur heard. Pulmonary/Chest: No stridor. No respiratory distress. She has no wheezes. She has rales.  Abdominal: Soft. Bowel sounds are normal. She exhibits no distension. There is no tenderness. There is no rebound.  Musculoskeletal: Normal range of motion. She exhibits no edema.  Neurological: She displays normal reflexes. Coordination normal.  lethargic  Skin: Skin is warm. She is not diaphoretic.  Psychiatric: She has a normal mood and affect.        LABS    Recent Labs     03/14/15  0603  03/15/15  0500  03/16/15  0454  HGB  12.3  13.0  12.5  HCT  36.5  39.0  38.3  MCV  76.5*  77.2*  77.2*  WBC  18.9*  24.2*  20.5*  BUN  16  14  14   CREATININE  1.02*  1.01*  0.92  GLUCOSE  117*  122*  92  CALCIUM  7.8*  8.4*  8.6*  ,    No results for input(s): PH in the last 72  hours.  Invalid input(s): PCO2, PO2, BASEEXCESS, BASEDEFICITE, TFT    CULTURE RESULTS   Recent Results (from the past 240 hour(s))  Blood culture (routine x 2)     Status: None   Collection Time: 03/12/15  3:34 PM  Result Value Ref Range Status   Specimen Description BLOOD LEFT ASSIST CONTROL  Final   Special Requests BOTTLES DRAWN AEROBIC AND ANAEROBIC  1CC  Final   Culture  Setup Time   Final    GRAM NEGATIVE RODS IN BOTH AEROBIC AND ANAEROBIC BOTTLES CRITICAL VALUE NOTED.  VALUE IS CONSISTENT WITH PREVIOUSLY REPORTED AND CALLED VALUE. PREVIOUSLY CALLED AT 0418 03/13/15.PMH  CONFIRMED BY RW    Culture   Final    ESCHERICHIA COLI IN BOTH AEROBIC AND ANAEROBIC BOTTLES    Report Status 03/15/2015 FINAL  Final   Organism ID, Bacteria ESCHERICHIA COLI  Final      Susceptibility   Escherichia coli - MIC*    AMPICILLIN <=2 SENSITIVE Sensitive     CEFTAZIDIME <=1 SENSITIVE Sensitive     CEFAZOLIN <=4 SENSITIVE Sensitive     CEFTRIAXONE <=1 SENSITIVE Sensitive     CIPROFLOXACIN <=0.25 SENSITIVE Sensitive     GENTAMICIN <=1 SENSITIVE Sensitive     IMIPENEM <=0.25 SENSITIVE Sensitive     TRIMETH/SULFA <=20 SENSITIVE Sensitive     PIP/TAZO Value in next row Sensitive      SENSITIVE<=4    AMPICILLIN/SULBACTAM Value in next row Sensitive      SENSITIVE<=2    ERTAPENEM Value in next row Sensitive      SENSITIVE<=0.5    * ESCHERICHIA COLI  Blood culture (routine x 2)     Status: None   Collection Time: 03/12/15  3:34 PM  Result Value Ref Range Status   Specimen Description BLOOD RIGHT ASSIST CONTROL  Final   Special Requests   Final    BOTTLES DRAWN AEROBIC AND ANAEROBIC  AER 4CC ANA 3CC   Culture  Setup Time   Final    GRAM NEGATIVE RODS IN BOTH AEROBIC AND ANAEROBIC BOTTLES CRITICAL RESULT CALLED TO, READ BACK BY AND VERIFIED WITH: [facility] AT 4403 03/13/15.PMH CONFIRMED BY RW    Culture   Final    ESCHERICHIA COLI IN BOTH AEROBIC AND ANAEROBIC BOTTLES    Report  Status 03/15/2015 FINAL  Final   Organism ID, Bacteria ESCHERICHIA COLI  Final      Susceptibility   Escherichia coli - MIC*    AMPICILLIN <=2 SENSITIVE Sensitive     CEFTAZIDIME <=1 SENSITIVE Sensitive     CEFAZOLIN <=4 SENSITIVE Sensitive     CEFTRIAXONE <=1 SENSITIVE Sensitive     CIPROFLOXACIN <=0.25 SENSITIVE Sensitive     GENTAMICIN <=1 SENSITIVE Sensitive     IMIPENEM <=0.25 SENSITIVE Sensitive     TRIMETH/SULFA <=20 SENSITIVE Sensitive     PIP/TAZO Value in next row Sensitive      SENSITIVE<=4    AMPICILLIN/SULBACTAM Value in next row Sensitive      SENSITIVE<=2    ERTAPENEM Value in next row Sensitive      SENSITIVE<=0.5    * ESCHERICHIA COLI  Urine culture     Status: None   Collection Time: 03/12/15  4:33 PM  Result Value Ref Range Status   Specimen Description URINE, RANDOM  Final   Special Requests NONE  Final   Culture >=100,000 COLONIES/mL ESCHERICHIA COLI  Final   Report Status 03/14/2015 FINAL  Final   Organism ID, Bacteria ESCHERICHIA COLI  Final      Susceptibility   Escherichia coli - MIC*    AMPICILLIN <=2 SENSITIVE Sensitive     CEFTAZIDIME <=1 SENSITIVE Sensitive     CEFAZOLIN <=4 SENSITIVE Sensitive     CEFTRIAXONE <=1 SENSITIVE Sensitive     CIPROFLOXACIN <=0.25 SENSITIVE Sensitive     GENTAMICIN <=1 SENSITIVE Sensitive     IMIPENEM <=0.25 SENSITIVE Sensitive     TRIMETH/SULFA <=20 SENSITIVE Sensitive     PIP/TAZO Value in next row Sensitive      SENSITIVE<=4    * >=100,000 COLONIES/mL ESCHERICHIA COLI  C difficile quick scan w PCR reflex  Status: None   Collection Time: 03/15/15  6:36 PM  Result Value Ref Range Status   C Diff antigen NEGATIVE NEGATIVE Final   C Diff toxin NEGATIVE NEGATIVE Final   C Diff interpretation Negative for C. difficile  Final          IMAGING    Dg Chest 1 View  03/16/2015   CLINICAL DATA:  Respiratory failure.  EXAM: CHEST  1 VIEW  COMPARISON:  One-view chest 03/15/2015  FINDINGS: The heart is enlarged.  Chronic interstitial coarsening is again noted. There slight improved aeration in the right lung. Persistent left lower lobe opacities are noted. Pulmonary vascular congestion is noted. Atherosclerotic calcifications are present at the aortic arch.  IMPRESSION: 1. Persistent congestive heart failure. 2. Asymmetric left lower lobe airspace disease is concerning for pneumonia. 3. Aeration of the right lung has improved slightly.   Electronically Signed   By: Marin Roberts M.D.   On: 03/16/2015 07:33       ASSESSMENT/PLAN   81 yo white female seen today for acute resp distress-likely diagnosis is acute sepsis with UTI with gram negative bacteremia with Pneumonia/atalectasis  1.wean oxygen as needed 2.continue abx as prescribed 3.follow up cultures and sens 4.no indication for bronch at this time 5.lasix as tolerated 6.BD therapy as prescibed   I have personally obtained a history, examined the patient, evaluated laboratory and independently reviewed imaging results, formulated the assessment and plan and placed orders.  The Patient requires high complexity decision making for assessment and support, frequent evaluation and titration of therapies, application of advanced monitoring technologies and extensive interpretation of multiple databases. Time Spent with Patient 40 mins    Lucie Leather, M.D.  [facility]  Medical Director [facility] Medical Director [facility] Cardio-Pulmonary Department

## 2015-03-16 NOTE — Progress Notes (Signed)
Patient became very confused, would not stay in bed, up and angry that we were trying to keep her in room and help her back to bed, She states " I know this is not my bed, and my kitchen is out there, and I can go where I want to" she took high flo 02 off and would not put back on, notified MD and resp. Therapist, new order for haldol 5 mg iv, and 02 via [facility] when can coax her back to bed. Joe, and CNA, RN assisted patient back in bed, and patient did calm down. Nurse will continue to monitor. Nurse administered iv haldol.

## 2015-03-16 NOTE — Progress Notes (Signed)
Patient had pulled out iv, antibiotic ordered to infuse at 6am, notified Dr. Marcello Fennel and order to hold Meropenem until He makes rounds. Ok to leave saline lock out at this time.

## 2015-03-16 NOTE — Progress Notes (Signed)
Anita Ward is a 81 y.o. female  UTI (urinary tract infection)   SUBJECTIVE: 81 y/o f admitted with Nausea, Vomiting and loose stool. Noted to have E.coli UTI and is hypoxemic secondary to CHF and Pneumonia. This am - stools more formed. Lost IV access last night  Less confused   ______________________________________________________________________  ROS: Review of systems is unremarkable for any active cardiac,respiratory, GI, GU, hematologic, neurologic or psychiatric systems, 10 systems reviewed.  @CMEDLIST @  Past Medical History  Diagnosis Date  . HTN (hypertension)   . Coronary artery disease   . Glaucoma   . Osteoporosis     Past Surgical History  Procedure Laterality Date  . Cardiac surgery    . Cardiac surgery      PHYSICAL EXAM:  BP 163/61 mmHg  Pulse 96  Temp(Src) 97.7 F (36.5 C) (Oral)  Resp 18  Ht 5\' 2"  (1.575 m)  Wt 65.953 kg (145 lb 6.4 oz)  BMI 26.59 kg/m2  SpO2 94%  Wt Readings from Last 3 Encounters:  03/12/15 65.953 kg (145 lb 6.4 oz)  01/07/15 63.504 kg (140 lb)            Constitutional: NAD Neck: supple, no thyromegaly Respiratory: diffuse rhonchi and rales Cardiovascular: RRR, no murmur, no gallop Abdomen: soft, good BS, nontender Extremities: no edema Neuro: alert but disoriented, no focal motor or sensory deficits  ASSESSMENT/PLAN:  1 E.coli UTI; On Meropenem 2 Acute Respiratory failure secondary to Left Lower lobe Pneumonia and Diastolic CHF Continue O2,Lasix and Antibiotics 3 HTN; Monitor closely 4 Hypothyroidism; Continue Levothyroxine

## 2015-03-16 NOTE — Progress Notes (Signed)
Palliative Medicine Inpatient Consult Follow Up Note   Name: Anita Ward Date: 03/16/2015 MRN: [id number]  DOB: 08/16/1919  Referring Physician: Barbette Reichmann, MD  Palliative Care consult requested for this 81 y.o. female for goals of medical therapy in patient with sepsis due to UTI, pneumonia and respiratory failure, and delerium.  Pt is intermittently alert and reasonably oriented, and then she becomes confused for periods of time.  Was confused in the middle of the night, then more oriented around 8 am and now confused again at 10 am.   Burgess Estelle, we established a DNR status.  There had been hope of trying to get a HCPOA/ Living Will done, but pt was too confused when chaplain came by yesterday for this to be completed.  Ricky Ala is her son who is making decisions when she cannot.    TODAY'S CONVERSATIONS, EVENTS, AND PLAN:  1.  She remains DNR status  2. She would do better being changed over to oral meds if possible.  Would minimize lines to minimize the damage she can do when confused (where possible).  3.  She is down to 4 LPM of oxygen, off hi flow --so she could go to a SNF when Dr. Marcello Fennel feels she is ready for discharge.  I will talk with him.    4.  Strongly recommend a Palliative Care Consult in the SNF (someone to check on her overall status from time to time --to see when she starte to become someone appropriate for Hospice. Pt does not have Medicaid and therefore, would have to be private pay for a long term care bed in the skilled facility once her Medicare skilled Rehab days run out.  Family needs to be prepared to consider various options as pt is very highly likely to not ever again be someone who should be living entirely alone.   I will talk with family again today when they get here.     PLEASE PUT THIS IN DISCHARGE SUMMARY:  PATIENT SHOULD HAVE A PALLIATIVE CARE CONSULT AT THE FACILITY   REVIEW OF SYSTEMS:  Patient is not able to provide ROS due to  delerium  CODE STATUS: DNR   PAST MEDICAL HISTORY: Past Medical History  Diagnosis Date  . HTN (hypertension)   . Coronary artery disease   . Glaucoma   . Osteoporosis     PAST SURGICAL HISTORY:  Past Surgical History  Procedure Laterality Date  . Cardiac surgery    . Cardiac surgery      Vital Signs: BP 163/61 mmHg  Pulse 96  Temp(Src) 97.7 F (36.5 C) (Oral)  Resp 18  Ht 5\' 2"  (1.575 m)  Wt 65.953 kg (145 lb 6.4 oz)  BMI 26.59 kg/m2  SpO2 96% Filed Weights   03/12/15 1355 03/12/15 2018  Weight: 61.236 kg (135 lb) 65.953 kg (145 lb 6.4 oz)    Estimated body mass index is 26.59 kg/(m^2) as calculated from the following:   Height as of this encounter: 5\' 2"  (1.575 m).   Weight as of this encounter: 65.953 kg (145 lb 6.4 oz).  PHYSICAL EXAM: She is floridly confused now. She has a cousin and a church friend visiting and they are trying to keep pt from climbing out of bed over the rails to go to the bathroom. Eyes look weak but EOMI OP is DRY Heart rrr 1/6 HSM  Lungs sound clear anteriorly but not much air is moved in bases Abd soft and protuberant Ext nl, but pal  Skin no mottling or cyanosis  She is currently on 4 LPM nasal cannula oxygen. Sats are 96    LABS: CBC:    Component Value Date/Time   WBC 20.5* 03/16/2015 0454   WBC 9.1 01/27/2014 1810   HGB 12.5 03/16/2015 0454   HGB 14.0 01/27/2014 1810   HCT 38.3 03/16/2015 0454   HCT 41.8 01/27/2014 1810   PLT 401 03/16/2015 0454   PLT 319 01/27/2014 1810   MCV 77.2* 03/16/2015 0454   MCV 80 01/27/2014 1810   NEUTROABS 16.6* 03/16/2015 0454   NEUTROABS 7.6* 11/12/2012 2330   LYMPHSABS 1.4 03/16/2015 0454   LYMPHSABS 1.9 11/12/2012 2330   MONOABS 2.2* 03/16/2015 0454   MONOABS 1.1* 11/12/2012 2330   EOSABS 0.1 03/16/2015 0454   EOSABS 0.3 11/12/2012 2330   BASOSABS 0.1 03/16/2015 0454   BASOSABS 0.1 11/12/2012 2330   Comprehensive Metabolic Panel:    Component Value Date/Time   NA 132*  03/16/2015 0454   NA 136 01/27/2014 1810   K 4.6 03/16/2015 0454   K 3.4* 01/27/2014 1810   CL 95* 03/16/2015 0454   CL 97* 01/27/2014 1810   CO2 30 03/16/2015 0454   CO2 31 01/27/2014 1810   BUN 14 03/16/2015 0454   BUN 14 01/27/2014 1810   CREATININE 0.92 03/16/2015 0454   CREATININE 1.12 01/27/2014 1810   GLUCOSE 92 03/16/2015 0454   GLUCOSE 103* 01/27/2014 1810   CALCIUM 8.6* 03/16/2015 0454   CALCIUM 9.2 01/27/2014 1810   AST 40 03/12/2015 1424   AST 22 05/26/2012 1800   ALT 20 03/12/2015 1424   ALT 18 05/26/2012 1800   ALKPHOS 75 03/12/2015 1424   ALKPHOS 73 05/26/2012 1800   BILITOT 0.8 03/12/2015 1424   BILITOT 0.4 05/26/2012 1800   PROT 6.9 03/12/2015 1424   PROT 7.7 05/26/2012 1800   ALBUMIN 3.0* 03/12/2015 1424   ALBUMIN 3.5 05/26/2012 1800   TESTS: Echo Heart 03/14/15: - Left ventricle: There was moderate concentric hypertrophy. Systolic function was normal. The estimated ejection fraction was in the range of 55% to 60%. - Aortic valve: There was mild to moderate stenosis. There was mild regurgitation. Valve area (Vmax): 1.32 cm^2. - Mitral valve: There was mild regurgitation. - Left atrium: The atrium was mildly dilated. - Right atrium: The atrium was mildly dilated. - Tricuspid valve: There was moderate regurgitation.  IMPRESSION: 1. UTI with associated abdominal pain, vomiting, diarrhea, chills, weakness and urinary incontinence (not a problem baseline) (given Zosyn x1 dose, then changed to Levaquin, but due to allergy to Cipro was changed to ceftriaxone and then on 9/3 was changed to [facility] for recurrent UTI. E coli is sens to ceftriaxone. Fever, Chills, etc = SIRS / sepsis 2. Profound leukocytosis (19 - 16- 19- 24-20) 3. Acute respiratory failure ---CXR on 9/3 was negative  ---CXR on 9/4 shows effusion on left, interstitial pulmonary edema, and right lung infiltrate/ atelectasis and cardiomegaly ---echo was done 9/4 and result is  pending --- she likely has restrictive lung disease due to thoracic kyphosis from osteoporosis 4. CHF --acute but echo is pending so cannot characterize further ---lasix given 5. Pneumonia  ---was on merrem --she pulled IV out  6. Pleural Effusions 7. HTN 8. CAD 9. Glaucoma 10. Osteoporosis ---with thoracic kyphosis 11. Acute on chronic renal failure (baseline CKD degree not clear but likely) 12. Dehydration --corrected 13. Hypokalemia --corrected 14. Diarrhea --in the setting of chronic constipation 15. Hyperglycemia 16. Hyponatremia 17.  Delerium --coming and going  18.  Aortic Stenosis 'mild to moderate' 19. Tricuspid Regurgitation --moderate  See top of note for plan  REFERRALS TO BE ORDERED:  She should have a Palliative Care Consult in the facility --so that at the appropriate time, she can be transitioned to Hospice Care.   More than 50% of the visit was spent in counseling/coordination of care: YES  Time Spent:35 min

## 2015-03-16 NOTE — Plan of Care (Signed)
Problem: Discharge Progression Outcomes Goal: Other Discharge Outcomes/Goals Outcome: Progressing Progression toward goals: Discharge plan: Pt lives at home by herself with family and friends visits frequently. Unsure if she will be independent enough tp go home by self. Pain.  Pt has denied pain this shift. Hemodynamically stable Diet: needs encouragement to eat/feed self. Eats very small portions. Activity:  1-2 person assist to bedside commode. And moving in bed.

## 2015-03-17 ENCOUNTER — Inpatient Hospital Stay: Payer: Medicare Other

## 2015-03-17 LAB — BASIC METABOLIC PANEL
ANION GAP: 8 (ref 5–15)
BUN: 17 mg/dL (ref 6–20)
CHLORIDE: 98 mmol/L — AB (ref 101–111)
CO2: 28 mmol/L (ref 22–32)
Calcium: 8.9 mg/dL (ref 8.9–10.3)
Creatinine, Ser: 0.96 mg/dL (ref 0.44–1.00)
GFR calc Af Amer: 57 mL/min — ABNORMAL LOW (ref >60)
GFR calc non Af Amer: 49 mL/min — ABNORMAL LOW (ref >60)
GLUCOSE: 90 mg/dL (ref 65–99)
POTASSIUM: 4.7 mmol/L (ref 3.5–5.1)
Sodium: 134 mmol/L — ABNORMAL LOW (ref 135–145)

## 2015-03-17 LAB — CBC WITH DIFFERENTIAL/PLATELET
BASOS ABS: 0.1 10*3/uL (ref 0–0.1)
Basophils Relative: 1 %
EOS PCT: 1 %
Eosinophils Absolute: 0.2 10*3/uL (ref 0–0.7)
HEMATOCRIT: 40.5 % (ref 35.0–47.0)
Hemoglobin: 13.3 g/dL (ref 12.0–16.0)
LYMPHS ABS: 1.5 10*3/uL (ref 1.0–3.6)
LYMPHS PCT: 9 %
MCH: 25.8 pg — AB (ref 26.0–34.0)
MCHC: 32.8 g/dL (ref 32.0–36.0)
MCV: 78.6 fL — AB (ref 80.0–100.0)
Monocytes Absolute: 1.9 10*3/uL — ABNORMAL HIGH (ref 0.2–0.9)
Monocytes Relative: 11 %
NEUTROS ABS: 13.9 10*3/uL — AB (ref 1.4–6.5)
Neutrophils Relative %: 78 %
Platelets: 467 10*3/uL — ABNORMAL HIGH (ref 150–440)
RBC: 5.15 MIL/uL (ref 3.80–5.20)
RDW: 15.3 % — ABNORMAL HIGH (ref 11.5–14.5)
WBC: 17.6 10*3/uL — AB (ref 3.6–11.0)

## 2015-03-17 MED ORDER — LEVOFLOXACIN 750 MG PO TABS
750.0000 mg | ORAL_TABLET | ORAL | Status: DC
  Administered 2015-03-17 – 2015-03-19 (×2): 750 mg via ORAL
  Filled 2015-03-17 (×2): qty 1

## 2015-03-17 MED ORDER — ENSURE ENLIVE PO LIQD
237.0000 mL | Freq: Two times a day (BID) | ORAL | Status: DC
  Administered 2015-03-17 (×2): 237 mL via ORAL

## 2015-03-17 NOTE — Progress Notes (Signed)
ANTIBIOTIC CONSULT NOTE - INITIAL  Pharmacy Consult levaquin Indication: UTI/bacteremia/PNA  Allergies  Allergen Reactions  . Benicar [Olmesartan] Other (See Comments)    Reaction:  Unknown   . Cardura [Doxazosin Mesylate] Other (See Comments)    Reaction:  Unknown   . Ciprofloxacin Other (See Comments)    Reaction:  Unknown  . Fosamax [Alendronate Sodium] Other (See Comments)    Reaction:  Unknown  . Lisinopril Other (See Comments)    Reaction:  Unknown   . Norvasc [Amlodipine Besylate] Other (See Comments)    Reaction:  Unknown   . Penicillins Other (See Comments)    Reaction:  Unknown   . Sulfa Antibiotics Other (See Comments)    Reaction:  Unknown   . Torsemide Anxiety    Patient Measurements: Height: 5\' 2"  (157.5 cm) Weight: 145 lb 6.4 oz (65.953 kg) IBW/kg (Calculated) : 50.1 Adjusted Body Weight:   Vital Signs: Temp: 97.6 F (36.4 C) (09/07 0502) Temp Source: Oral (09/07 0502) BP: 169/63 mmHg (09/07 0502) Pulse Rate: 92 (09/07 0502) Intake/Output from previous day: 09/06 0701 - 09/07 0700 In: -  Out: 2250 [Urine:2250] Intake/Output from this shift:    Labs:  Recent Labs  03/15/15 0500 03/16/15 0454 03/17/15 0503  WBC 24.2* 20.5* 17.6*  HGB 13.0 12.5 13.3  PLT 324 401 467*  CREATININE 1.01* 0.92 0.96   Estimated Creatinine Clearance: 32.7 mL/min (by C-G formula based on Cr of 0.96). No results for input(s): VANCOTROUGH, VANCOPEAK, VANCORANDOM, GENTTROUGH, GENTPEAK, GENTRANDOM, TOBRATROUGH, TOBRAPEAK, TOBRARND, AMIKACINPEAK, AMIKACINTROU, AMIKACIN in the last 72 hours.   Microbiology: Recent Results (from the past 720 hour(s))  Blood culture (routine x 2)     Status: None   Collection Time: 03/12/15  3:34 PM  Result Value Ref Range Status   Specimen Description BLOOD LEFT ASSIST CONTROL  Final   Special Requests BOTTLES DRAWN AEROBIC AND ANAEROBIC  1CC  Final   Culture  Setup Time   Final    GRAM NEGATIVE RODS IN BOTH AEROBIC AND ANAEROBIC  BOTTLES CRITICAL VALUE NOTED.  VALUE IS CONSISTENT WITH PREVIOUSLY REPORTED AND CALLED VALUE. PREVIOUSLY CALLED AT 0418 03/13/15.PMH CONFIRMED BY RW    Culture   Final    ESCHERICHIA COLI IN BOTH AEROBIC AND ANAEROBIC BOTTLES    Report Status 03/15/2015 FINAL  Final   Organism ID, Bacteria ESCHERICHIA COLI  Final      Susceptibility   Escherichia coli - MIC*    AMPICILLIN <=2 SENSITIVE Sensitive     CEFTAZIDIME <=1 SENSITIVE Sensitive     CEFAZOLIN <=4 SENSITIVE Sensitive     CEFTRIAXONE <=1 SENSITIVE Sensitive     CIPROFLOXACIN <=0.25 SENSITIVE Sensitive     GENTAMICIN <=1 SENSITIVE Sensitive     IMIPENEM <=0.25 SENSITIVE Sensitive     TRIMETH/SULFA <=20 SENSITIVE Sensitive     PIP/TAZO Value in next row Sensitive      SENSITIVE<=4    AMPICILLIN/SULBACTAM Value in next row Sensitive      SENSITIVE<=2    ERTAPENEM Value in next row Sensitive      SENSITIVE<=0.5    * ESCHERICHIA COLI  Blood culture (routine x 2)     Status: None   Collection Time: 03/12/15  3:34 PM  Result Value Ref Range Status   Specimen Description BLOOD RIGHT ASSIST CONTROL  Final   Special Requests   Final    BOTTLES DRAWN AEROBIC AND ANAEROBIC  AER 4CC ANA 3CC   Culture  Setup Time   Final  GRAM NEGATIVE RODS IN BOTH AEROBIC AND ANAEROBIC BOTTLES CRITICAL RESULT CALLED TO, READ BACK BY AND VERIFIED WITH: [facility] AT 5621 03/13/15.PMH CONFIRMED BY RW    Culture   Final    ESCHERICHIA COLI IN BOTH AEROBIC AND ANAEROBIC BOTTLES    Report Status 03/15/2015 FINAL  Final   Organism ID, Bacteria ESCHERICHIA COLI  Final      Susceptibility   Escherichia coli - MIC*    AMPICILLIN <=2 SENSITIVE Sensitive     CEFTAZIDIME <=1 SENSITIVE Sensitive     CEFAZOLIN <=4 SENSITIVE Sensitive     CEFTRIAXONE <=1 SENSITIVE Sensitive     CIPROFLOXACIN <=0.25 SENSITIVE Sensitive     GENTAMICIN <=1 SENSITIVE Sensitive     IMIPENEM <=0.25 SENSITIVE Sensitive     TRIMETH/SULFA <=20 SENSITIVE Sensitive      PIP/TAZO Value in next row Sensitive      SENSITIVE<=4    AMPICILLIN/SULBACTAM Value in next row Sensitive      SENSITIVE<=2    ERTAPENEM Value in next row Sensitive      SENSITIVE<=0.5    * ESCHERICHIA COLI  Urine culture     Status: None   Collection Time: 03/12/15  4:33 PM  Result Value Ref Range Status   Specimen Description URINE, RANDOM  Final   Special Requests NONE  Final   Culture >=100,000 COLONIES/mL ESCHERICHIA COLI  Final   Report Status 03/14/2015 FINAL  Final   Organism ID, Bacteria ESCHERICHIA COLI  Final      Susceptibility   Escherichia coli - MIC*    AMPICILLIN <=2 SENSITIVE Sensitive     CEFTAZIDIME <=1 SENSITIVE Sensitive     CEFAZOLIN <=4 SENSITIVE Sensitive     CEFTRIAXONE <=1 SENSITIVE Sensitive     CIPROFLOXACIN <=0.25 SENSITIVE Sensitive     GENTAMICIN <=1 SENSITIVE Sensitive     IMIPENEM <=0.25 SENSITIVE Sensitive     TRIMETH/SULFA <=20 SENSITIVE Sensitive     PIP/TAZO Value in next row Sensitive      SENSITIVE<=4    * >=100,000 COLONIES/mL ESCHERICHIA COLI  C difficile quick scan w PCR reflex     Status: None   Collection Time: 03/15/15  6:36 PM  Result Value Ref Range Status   C Diff antigen NEGATIVE NEGATIVE Final   C Diff toxin NEGATIVE NEGATIVE Final   C Diff interpretation Negative for C. difficile  Final      Assessment: Pharmacy consulted to dose meropenem for UTI in this 89 yof cc abdominal pain/emesis/diarrhea with multiple drug allergies and history of recurrent UTIs.   Patient now with pan sensitive e. Coli in blood and urine.  Of note, patient cannot keep IV access due to dementia/agitation. RN has placed 4 IVs and been unable to keep. Has not received meropenem since 9/5  Plan:  Discussed antibiotic selection with MD. Difficult choice given patient allergies and inability to maintain IV access. Patient allergic to penicillin, cipro, sulfa drugs. All reactions unknown.  Per MD will try levaquin. Ordered levofloxacin  PO Q48H  to cover PNA and UTI. Will discontinue azithromycin and meropenem at this time. Discussed history of cipro allergy with RN, will monitor for reaction.  Pharmacy to follow per consult  Garlon Hatchet, PharmD Clinical Pharmacist  03/17/2015,8:25 AM

## 2015-03-17 NOTE — Progress Notes (Signed)
    SUBJECTIVE: 81 y/o f admitted with Nausea, Vomiting and loose stool. Noted to have E.coli UTI and is hypoxemic secondary to CHF and Pneumonia. This am - stools more formed. Less confused  C/o Abdominal discomfort  ______________________________________________________________________  ROS: Review of systems is unremarkable for any active cardiac,respiratory, GI, GU, hematologic, neurologic or psychiatric systems, 10 systems reviewed.  @CMEDLIST @  Past Medical History  Diagnosis Date  . HTN (hypertension)   . Coronary artery disease   . Glaucoma   . Osteoporosis     Past Surgical History  Procedure Laterality Date  . Cardiac surgery    . Cardiac surgery      PHYSICAL EXAM:  BP 169/63 mmHg  Pulse 92  Temp(Src) 97.6 F (36.4 C) (Oral)  Resp 18  Ht 5\' 2"  (1.575 m)  Wt 65.953 kg (145 lb 6.4 oz)  BMI 26.59 kg/m2  SpO2 97%  Wt Readings from Last 3 Encounters:  03/12/15 65.953 kg (145 lb 6.4 oz)  01/07/15 63.504 kg (140 lb)            Constitutional: NAD Neck: supple, no thyromegaly Respiratory: diffuse rhonchi and rales Cardiovascular: RRR, no murmur, no gallop Abdomen: soft, good BS, nontender Extremities: no edema Neuro: alert -no focal motor or sensory deficits  ASSESSMENT/PLAN:  1 E.coli UTI; No longer on Meropenem because of allergies. D/w Pharmacy- will start Levaquin Pt reportedly has Cipro listed as an allergy- but is unclear what kind of allergy 2 Acute Respiratory failure secondary to Left Lower lobe Pneumonia and Diastolic CHF Continue O2,Lasix and Antibiotics 3 HTN; On Nifedipine  Monitor closely 4 Hypothyroidism; Continue Levothyroxine 5 Weakness; Seen by Physical Therapy-likely will need rehab

## 2015-03-17 NOTE — Progress Notes (Signed)
Clinical Child psychotherapist (CSW) presented bed offers to patient. Patient's grandson, his wife and patient's 2 neighbors were at bedside. Patient chose [facility]. CSW made [facility] admissions coordinator at [facility] aware of accepted bed offer. CSW will continue to follow and assist as needed.   Jetta Lout, LCSWA [phone]

## 2015-03-17 NOTE — Plan of Care (Signed)
Problem: Discharge Progression Outcomes Goal: Other Discharge Outcomes/Goals Outcome: Progressing Discharge to facility Friday. Pt taking PO levaquin 750, started today. Worked with PT, recommend snf placement. Bed search in place.

## 2015-03-17 NOTE — Progress Notes (Signed)
Physical Therapy Treatment Patient Details Name: Anita Ward MRN: [id number] DOB: 1920-03-21 Today's Date: 03/17/2015    History of Present Illness Pt is a 81 yo female admitted to the hospital for UTI and SOB.    PT Comments    Pt making better progress towards goals today with less cognitive impairment and more cooperative and pleasant with therapy. Pt able to ambulate greater distances and is able to perform exercise program. Pt willing to ambulate and states that she feels much better when sitting up in the recliner. Pt still has strength and activity tolerance deficits that will benefit from skilled PT. Pt performed all mobility on 3L 02 [facility] with her 02 sat never falling below 93%  Follow Up Recommendations  SNF     Equipment Recommendations  Rolling walker with 5" wheels;Other (comment)    Recommendations for Other Services       Precautions / Restrictions Precautions Precautions: Fall Restrictions Weight Bearing Restrictions: No    Mobility  Bed Mobility Overal bed mobility: Needs Assistance Bed Mobility: Supine to Sit;Sit to Supine     Supine to sit: Min assist     General bed mobility comments: Pt requires assist for trunk support to get into sitting. Needs cues for hand placement   Transfers Overall transfer level: Needs assistance Equipment used: Rolling walker (2 wheeled) Transfers: Sit to/from Stand Sit to Stand: Min assist Stand pivot transfers: Min assist       General transfer comment: Pt requires assist to get into standing and cues for hand placement on walker. Shows good functional strength after initially getting off of the bed  Ambulation/Gait Ambulation/Gait assistance: Min assist Ambulation Distance (Feet): 10 Feet Assistive device: Rolling walker (2 wheeled) Gait Pattern/deviations: Decreased step length - right;Decreased step length - left;Step-to pattern;Shuffle Gait velocity: decreased Gait velocity interpretation: <1.8 ft/sec,  indicative of risk for recurrent falls General Gait Details: Pt needs assist for advancement of RW and cues for sequencing of RW with gait.  (Performed on 3L O2 )   Stairs            Wheelchair Mobility    Modified Rankin (Stroke Patients Only)       Balance Overall balance assessment: Needs assistance   Sitting balance-Leahy Scale: Fair     Standing balance support: Bilateral upper extremity supported Standing balance-Leahy Scale: Fair                      Cognition Arousal/Alertness: Awake/alert Behavior During Therapy: WFL for tasks assessed/performed Overall Cognitive Status: Within Functional Limits for tasks assessed (Pt cognition appears returned to baseline this date. )                      Exercises Other Exercises Other Exercises: Pt performed bilateral therex x12 reps with supervision for technique. Exercises performed: ankle pumps, quad sets, glute sets, SLR, and hip abd/add     General Comments        Pertinent Vitals/Pain Pain Assessment: No/denies pain    Home Living Family/patient expects to be discharged to:: Private residence Living Arrangements: Alone Available Help at Discharge: Friend(s);Available PRN/intermittently Type of Home: House Home Access: Stairs to enter     Home Equipment: Cane - single point;Walker - 2 wheels      Prior Function            PT Goals (current goals can now be found in the care plan section) Acute Rehab PT Goals Patient  Stated Goal: to participate in therapy to the best of her ability  PT Goal Formulation: With patient Time For Goal Achievement: 03/27/15 Potential to Achieve Goals: Good Progress towards PT goals: Progressing toward goals    Frequency  Min 2X/week    PT Plan Current plan remains appropriate    Co-evaluation             End of Session Equipment Utilized During Treatment: Gait belt;Oxygen Activity Tolerance: Patient tolerated treatment well Patient left:  in chair;with call bell/phone within reach (Nurse notified of no chair alarm in room. )     Time: 8257-4935 PT Time Calculation (min) (ACUTE ONLY): 27 min  Charges:                       G CodesBenna Dunks March 27, 2015, 1:49 PM  Benna Dunks, SPT. [phone]

## 2015-03-17 NOTE — Progress Notes (Signed)
Initial Nutrition Assessment   INTERVENTION:   Meals and Snacks: Cater to patient preferences Medical Food Supplement Therapy: will recommend Ensure Enlive po BID, each supplement provides 350 kcal and 20 grams of protein Coordination of Care: will request daily weights as pt now with CHF per MD note and reports weight usually lower than last recorded weight on admission   NUTRITION DIAGNOSIS:   Inadequate oral intake related to poor appetite as evidenced by per patient/family report.  GOAL:   Patient will meet greater than or equal to 90% of their needs  MONITOR:    (Energy Intake, Digestive System, Anthropometrics, Electrolyte and renal Profile)  REASON FOR ASSESSMENT:   LOS    ASSESSMENT:   Pt admitted with abdominal pain, n/v, and loose stools with poor po intake and acute respiratory failure secondary to CHF and pna.  Past Medical History  Diagnosis Date  . HTN (hypertension)   . Coronary artery disease   . Glaucoma   . Osteoporosis      Diet Order:  Diet Heart Room service appropriate?: No; Fluid consistency:: Thin    Current Nutrition: Pt reports eating some yogurt this am. Pt reports poor po appetite currently.   Food/Nutrition-Related History: Pt reports poor po intake and poor appetite for the past week PTA.  Pt with poor po intake since admission, eating small amounts (day 5). Pt reports having Ensure at home for son (who lives in a group home) but was not drinking any of it.  Pt reports eating out with a friend daily at lunch PTA and saving half of meal for dinners usually before feeling ill.    Medications: Lasix, Zofran  Electrolyte/Renal Profile and Glucose Profile:   Recent Labs Lab 03/15/15 0500 03/16/15 0454 03/17/15 0503  NA 131* 132* 134*  K 4.7 4.6 4.7  CL 95* 95* 98*  CO2 28 30 28   BUN 14 14 17   CREATININE 1.01* 0.92 0.96  CALCIUM 8.4* 8.6* 8.9  GLUCOSE 122* 92 90   Protein Profile:  Recent Labs Lab 03/12/15 1424  ALBUMIN  3.0*    Gastrointestinal Profile: Last BM: 03/16/2015   Nutrition-Focused Physical Exam Findings: Nutrition-Focused physical exam completed. Findings are WDL for fat depletion, muscle depletion, and edema.    Weight Change: Pt reports UBW of 138-140lbs.  Skin:  Reviewed, no issues    BMI:  Body mass index is 26.59 kg/(m^2).  Estimated Nutritional Needs:   Kcal:  BEE: 1178kcals, TEE: (IF 1.1-1.3)(AF 1.2) 1342-1586kcals  Protein:  53-66g protein (0.8-1.0g/kg)  Fluid:  1648-1950mL of fluid (25-66mL/kg)  EDUCATION NEEDS:   Education needs no appropriate at this time   MODERATE Care Level  Leda Quail, RD, LDN Pager [phone]

## 2015-03-17 NOTE — Plan of Care (Signed)
Problem: Discharge Progression Outcomes Goal: Other Discharge Outcomes/Goals Outcome: Progressing Plan of care progress to goal: Patient oriented to person and place, but is forgetful and sometimes becomes upset about not being home, admitted with hyponatremia,n/v Diet: patient has poor appetite Mobility:Patient up to bsc with assist. Up to chair with assist Pain: patient does complain of abd pain at times, abd is distended.  Patient has slept tonight, no agitation noted.

## 2015-03-18 LAB — CBC WITH DIFFERENTIAL/PLATELET
BASOS PCT: 0 %
Basophils Absolute: 0 10*3/uL (ref 0–0.1)
EOS ABS: 0.2 10*3/uL (ref 0–0.7)
Eosinophils Relative: 1 %
HCT: 39.9 % (ref 35.0–47.0)
HEMOGLOBIN: 13.4 g/dL (ref 12.0–16.0)
LYMPHS ABS: 1.7 10*3/uL (ref 1.0–3.6)
Lymphocytes Relative: 8 %
MCH: 26.3 pg (ref 26.0–34.0)
MCHC: 33.7 g/dL (ref 32.0–36.0)
MCV: 78.3 fL — ABNORMAL LOW (ref 80.0–100.0)
MONO ABS: 1.7 10*3/uL — AB (ref 0.2–0.9)
Monocytes Relative: 8 %
NEUTROS ABS: 17.5 10*3/uL — AB (ref 1.4–6.5)
Neutrophils Relative %: 83 %
PLATELETS: 505 10*3/uL — AB (ref 150–440)
RBC: 5.09 MIL/uL (ref 3.80–5.20)
RDW: 15.4 % — AB (ref 11.5–14.5)
WBC: 21.1 10*3/uL — ABNORMAL HIGH (ref 3.6–11.0)

## 2015-03-18 LAB — BASIC METABOLIC PANEL
Anion gap: 8 (ref 5–15)
BUN: 21 mg/dL — AB (ref 6–20)
CALCIUM: 9.2 mg/dL (ref 8.9–10.3)
CHLORIDE: 98 mmol/L — AB (ref 101–111)
CO2: 28 mmol/L (ref 22–32)
CREATININE: 0.94 mg/dL (ref 0.44–1.00)
GFR calc non Af Amer: 51 mL/min — ABNORMAL LOW (ref >60)
GFR, EST AFRICAN AMERICAN: 59 mL/min — AB (ref >60)
Glucose, Bld: 108 mg/dL — ABNORMAL HIGH (ref 65–99)
Potassium: 4.7 mmol/L (ref 3.5–5.1)
SODIUM: 134 mmol/L — AB (ref 135–145)

## 2015-03-18 MED ORDER — ENOXAPARIN SODIUM 30 MG/0.3ML ~~LOC~~ SOLN
30.0000 mg | SUBCUTANEOUS | Status: DC
  Administered 2015-03-18: 30 mg via SUBCUTANEOUS
  Filled 2015-03-18: qty 0.3

## 2015-03-18 MED ORDER — CLONIDINE HCL 0.1 MG PO TABS
0.1000 mg | ORAL_TABLET | Freq: Two times a day (BID) | ORAL | Status: DC
  Administered 2015-03-18 – 2015-03-19 (×3): 0.1 mg via ORAL
  Filled 2015-03-18 (×3): qty 1

## 2015-03-18 NOTE — Plan of Care (Signed)
Problem: Discharge Progression Outcomes Goal: Other Discharge Outcomes/Goals Outcome: Progressing Pt is alert and oriented, c/o chest pain. Nitro given, resulting in no chest pain following. Continues on 3 L of oxygen. 1 assist to the [facility]. C/o feeling hot and breaking out in a sweat, and then feeling cold, remains afebrile.

## 2015-03-18 NOTE — Progress Notes (Signed)
    SUBJECTIVE: 81 y/o f admitted with Nausea, Vomiting and loose stool. Noted to have E.coli UTI and is hypoxemic secondary to CHF and Pneumonia. This am c/o being sweaty and also has chills  ______________________________________________________________________      Past Medical History  Diagnosis Date  . HTN (hypertension)   . Coronary artery disease   . Glaucoma   . Osteoporosis     Past Surgical History  Procedure Laterality Date  . Cardiac surgery    . Cardiac surgery      PHYSICAL EXAM:  BP 186/59 mmHg  Pulse 91  Temp(Src) 98.3 F (36.8 C) (Oral)  Resp 17  Ht 5\' 2"  (1.575 m)  Wt 59.739 kg (131 lb 11.2 oz)  BMI 24.08 kg/m2  SpO2 97%  Wt Readings from Last 3 Encounters:  03/18/15 59.739 kg (131 lb 11.2 oz)  01/07/15 63.504 kg (140 lb)            Constitutional: NAD Neck: supple, no thyromegaly Respiratory: diffuse rhonchi and rales Cardiovascular: RRR, no murmur, no gallop Abdomen: soft, good BS, nontender Extremities: no edema Neuro: alert -no focal motor or sensory deficits  ASSESSMENT/PLAN:  1 E.coli UTI; On  Levaquin 2 Acute Respiratory failure secondary to Left Lower lobe Pneumonia and Diastolic CHF CXR; Improving Left Basilar infiltrate Continue O2,Lasix and Antibiotics 3 HTN; On Nifedipine  Restart Clonidine 0.1 mg po bid 4 Hypothyroidism; Continue Levothyroxine 5 Weakness; Seen by Physical Therapy-For Rehab soon

## 2015-03-18 NOTE — Progress Notes (Signed)
Pharmacy Note - renal dose adjustment  Patient with orders for enoxaparin 40mg  SQ Q24H for DVT prophylaxis.   Estimated Creatinine Clearance: 29.6 mL/min (by C-G formula based on Cr of 0.94).  Creatinine clearance decreased (pt weight decreased) since admission. Will adjust to enoxaparin 30mg  SQ Q24H for CrCl < 40ml/min per anticoagulation policy.  Garlon Hatchet, PharmD Clinical Pharmacist 03/18/2015 9:58 AM

## 2015-03-18 NOTE — Care Management Important Message (Signed)
Important Message  Patient Details  Name: Anita Ward MRN: [id number] Date of Birth: 1920-03-31   Medicare Important Message Given:  Other (see comment)  IM actually given on Wed., 9/7 but documentation did not show up in EPIC so documenting today.    [name] 03/18/2015, 9:10 AM

## 2015-03-18 NOTE — Progress Notes (Signed)
Spoke with Dr Marcello Fennel PT had night sweat and some in am , BP elevated but afebrile he is adding clonidine to meds continue to monitor

## 2015-03-18 NOTE — Plan of Care (Signed)
Problem: Discharge Progression Outcomes Goal: Other Discharge Outcomes/Goals Outcome: Progressing Patient had no c/o pain this shift  Patient did have some sweats with no fever early in shift MD aware  BP elevated MD aware patient started back on Clonidine  Patient up to chair for meals with assist family and friends visiting Possible discharge tomorrow to SNF PT saw patient today and her ambulation was improved some

## 2015-03-18 NOTE — Progress Notes (Addendum)
Per MD patient is not medically stable for D/C today. Patient will likely D/C to [facility] tomorrow 03/19/15. [facility] admissions coordinator at [facility] is aware of above. CSW contacted patient's son Deniece Portela and grandson Reuel Boom and made them aware of above. CSW will continue to follow and assist as needed.   Jetta Lout, LCSWA [phone]

## 2015-03-18 NOTE — Progress Notes (Signed)
Palliative Medicine Inpatient Consult Follow Up Note   Name: Anita Ward Date: 03/18/2015 MRN: [id number]  DOB: 1919/09/20  Referring Physician: Barbette Reichmann, MD  Palliative Care consult requested for this 81 y.o. female for goals of medical therapy in patient with sepsis, UTI, Pneumonia, CHF, abdominal and chest pain, and acute respiratory failure.    Today's events and plan: I spoke with Dr. Marcello Fennel and it seems pt will be going for SNF placement for Short Term Rehab tomorrow.  She has come off of HI FLOW oxygen. She had desat of Oxygen this am --but has steadily improved a bit since.  She is to continue with DNR status.  I have recommended a palliative care consult in the facility.  That will become urgent if she does not do well.    CODE STATUS: DNR   PAST MEDICAL HISTORY: Past Medical History  Diagnosis Date  . HTN (hypertension)   . Coronary artery disease   . Glaucoma   . Osteoporosis     PAST SURGICAL HISTORY:  Past Surgical History  Procedure Laterality Date  . Cardiac surgery    . Cardiac surgery      Vital Signs: BP 183/80 mmHg  Pulse 99  Temp(Src) 98.3 F (36.8 C) (Oral)  Resp 17  Ht 5\' 2"  (1.575 m)  Wt 59.739 kg (131 lb 11.2 oz)  BMI 24.08 kg/m2  SpO2 97% Filed Weights   03/12/15 2018 03/17/15 1416 03/18/15 0648  Weight: 65.953 kg (145 lb 6.4 oz) 60.918 kg (134 lb 4.8 oz) 59.739 kg (131 lb 11.2 oz)    Estimated body mass index is 24.08 kg/(m^2) as calculated from the following:   Height as of this encounter: 5\' 2"  (1.575 m).   Weight as of this encounter: 59.739 kg (131 lb 11.2 oz).  PHYSICAL EXAM: NAD  Mildly confused and sleepy Heart rrr no mgr Lungs decreased BS based  Skin warm and dry  LABS: CBC:    Component Value Date/Time   WBC 21.1* 03/18/2015 0514   WBC 9.1 01/27/2014 1810   HGB 13.4 03/18/2015 0514   HGB 14.0 01/27/2014 1810   HCT 39.9 03/18/2015 0514   HCT 41.8 01/27/2014 1810   PLT 505* 03/18/2015 0514   PLT 319  01/27/2014 1810   MCV 78.3* 03/18/2015 0514   MCV 80 01/27/2014 1810   NEUTROABS 17.5* 03/18/2015 0514   NEUTROABS 7.6* 11/12/2012 2330   LYMPHSABS 1.7 03/18/2015 0514   LYMPHSABS 1.9 11/12/2012 2330   MONOABS 1.7* 03/18/2015 0514   MONOABS 1.1* 11/12/2012 2330   EOSABS 0.2 03/18/2015 0514   EOSABS 0.3 11/12/2012 2330   BASOSABS 0.0 03/18/2015 0514   BASOSABS 0.1 11/12/2012 2330   Comprehensive Metabolic Panel:    Component Value Date/Time   NA 134* 03/18/2015 0514   NA 136 01/27/2014 1810   K 4.7 03/18/2015 0514   K 3.4* 01/27/2014 1810   CL 98* 03/18/2015 0514   CL 97* 01/27/2014 1810   CO2 28 03/18/2015 0514   CO2 31 01/27/2014 1810   BUN 21* 03/18/2015 0514   BUN 14 01/27/2014 1810   CREATININE 0.94 03/18/2015 0514   CREATININE 1.12 01/27/2014 1810   GLUCOSE 108* 03/18/2015 0514   GLUCOSE 103* 01/27/2014 1810   CALCIUM 9.2 03/18/2015 0514   CALCIUM 9.2 01/27/2014 1810   AST 40 03/12/2015 1424   AST 22 05/26/2012 1800   ALT 20 03/12/2015 1424   ALT 18 05/26/2012 1800   ALKPHOS 75 03/12/2015 1424  ALKPHOS 73 05/26/2012 1800   BILITOT 0.8 03/12/2015 1424   BILITOT 0.4 05/26/2012 1800   PROT 6.9 03/12/2015 1424   PROT 7.7 05/26/2012 1800   ALBUMIN 3.0* 03/12/2015 1424   ALBUMIN 3.5 05/26/2012 1800    IMPRESSION: 1. UTI with associated abdominal pain, vomiting, diarrhea, chills, weakness and urinary incontinence (not a problem baseline) (given Zosyn x1 dose, then changed to Levaquin, but due to allergy to Cipro was changed to ceftriaxone and then on 9/3 was changed to [facility] for recurrent UTI. E coli is sens to ceftriaxone. Fever, Chills, etc = SIRS / sepsis 2. Profound leukocytosis (19 - 16- 19- 24 - 21) 3. Acute respiratory failure ---CXR on 9/3 was negative  ---CXR on 9/4 shows effusion on left, interstitial pulmonary edema, and right lung infiltrate/ atelectasis and cardiomegaly ---echo was done 9/4 and result is pending --- she likely has restrictive  lung disease due to thoracic kyphosis from osteoporosis 4. CHF --acute but echo is pending so cannot characterize further ---lasix given 5. Pneumonia --on merrem 6. Pleural Effusions 7. HTN 8. CAD 9. Glaucoma 10. Osteoporosis ---with thoracic kyphosis 11. Acute on chronic renal failure (baseline CKD degree not clear but likely) 12. Dehydration --corrected 13. Hypokalemia --corrected 14. Diarrhea --in the setting of chronic constipation 15. Hyperglycemia 16. Hyponatremia    See top of note for plan  More than 50% of the visit was spent in counseling/coordination of care: YES  Time Spent: 15 min

## 2015-03-19 DIAGNOSIS — A4151 Sepsis due to Escherichia coli [E. coli]: Secondary | ICD-10-CM | POA: Diagnosis not present

## 2015-03-19 LAB — CBC WITH DIFFERENTIAL/PLATELET
BASOS PCT: 1 %
Basophils Absolute: 0.1 10*3/uL (ref 0–0.1)
Eosinophils Absolute: 0.1 10*3/uL (ref 0–0.7)
Eosinophils Relative: 0 %
HEMATOCRIT: 38.8 % (ref 35.0–47.0)
HEMOGLOBIN: 12.7 g/dL (ref 12.0–16.0)
LYMPHS ABS: 1.8 10*3/uL (ref 1.0–3.6)
Lymphocytes Relative: 9 %
MCH: 25.5 pg — AB (ref 26.0–34.0)
MCHC: 32.7 g/dL (ref 32.0–36.0)
MCV: 77.9 fL — ABNORMAL LOW (ref 80.0–100.0)
MONO ABS: 1.5 10*3/uL — AB (ref 0.2–0.9)
MONOS PCT: 7 %
NEUTROS ABS: 18 10*3/uL — AB (ref 1.4–6.5)
NEUTROS PCT: 83 %
Platelets: 504 10*3/uL — ABNORMAL HIGH (ref 150–440)
RBC: 4.98 MIL/uL (ref 3.80–5.20)
RDW: 15 % — AB (ref 11.5–14.5)
WBC: 21.5 10*3/uL — ABNORMAL HIGH (ref 3.6–11.0)

## 2015-03-19 LAB — BASIC METABOLIC PANEL
Anion gap: 7 (ref 5–15)
BUN: 25 mg/dL — AB (ref 6–20)
CALCIUM: 9 mg/dL (ref 8.9–10.3)
CHLORIDE: 103 mmol/L (ref 101–111)
CO2: 26 mmol/L (ref 22–32)
CREATININE: 0.9 mg/dL (ref 0.44–1.00)
GFR calc non Af Amer: 53 mL/min — ABNORMAL LOW (ref >60)
GLUCOSE: 102 mg/dL — AB (ref 65–99)
Potassium: 4.7 mmol/L (ref 3.5–5.1)
Sodium: 136 mmol/L (ref 135–145)

## 2015-03-19 MED ORDER — LOSARTAN POTASSIUM 100 MG PO TABS: 50.0000 mg | ORAL_TABLET | Freq: Every day | ORAL | Status: DC

## 2015-03-19 MED ORDER — LEVOFLOXACIN 750 MG PO TABS: 750.0000 mg | ORAL_TABLET | ORAL | Status: DC

## 2015-03-19 MED ORDER — POTASSIUM CHLORIDE ER 20 MEQ PO TBCR: 10.0000 meq | EXTENDED_RELEASE_TABLET | Freq: Every day | ORAL | Status: DC

## 2015-03-19 MED ORDER — METOPROLOL SUCCINATE ER 50 MG PO TB24: ORAL_TABLET | ORAL | Status: DC

## 2015-03-19 MED ORDER — CLONIDINE HCL 0.2 MG PO TABS: 0.2000 mg | ORAL_TABLET | Freq: Two times a day (BID) | ORAL | Status: DC

## 2015-03-19 MED ORDER — FUROSEMIDE 40 MG PO TABS: 40.0000 mg | ORAL_TABLET | Freq: Every day | ORAL | Status: DC

## 2015-03-19 MED ORDER — ENSURE ENLIVE PO LIQD: 237.0000 mL | Freq: Two times a day (BID) | ORAL | Status: DC

## 2015-03-19 NOTE — Progress Notes (Signed)
Patient is medically stable for D/C to [facility] today. Per Sue Lush admissions coordinator at [facility] patient is going to room 212. RN will call report at [phone] and arrange EMS for transport. Clinical Child psychotherapist (CSW) prepared D/C packet and sent D/C Summary to [employer] via carefinder. Patient is aware of above. CSW contacted patient's son Deniece Portela and grandson Reuel Boom and made them aware of D/C today. Please reconsult if future social work needs arise. CSW signing off.   Jetta Lout, LCSWA [phone]

## 2015-03-19 NOTE — Care Management (Signed)
Discharge to [facility] today per Dr. Marcello Fennel. IM given Gwenette Greet RN MSN Care Management [phone]

## 2015-03-19 NOTE — Progress Notes (Signed)
    SUBJECTIVE: 81 y/o f admitted with Nausea, Vomiting and loose stool. Noted to have E.coli UTI and is hypoxemic secondary to CHF and Pneumonia. Feels better today. Blood pressure better controlled  ______________________________________________________________________      Past Medical History  Diagnosis Date  . HTN (hypertension)   . Coronary artery disease   . Glaucoma   . Osteoporosis     Past Surgical History  Procedure Laterality Date  . Cardiac surgery    . Cardiac surgery      PHYSICAL EXAM:  BP 159/67 mmHg  Pulse 103  Temp(Src) 97.8 F (36.6 C) (Oral)  Resp 20  Ht 5\' 2"  (1.575 m)  Wt 61.916 kg (136 lb 8 oz)  BMI 24.96 kg/m2  SpO2 96%  Wt Readings from Last 3 Encounters:  03/19/15 61.916 kg (136 lb 8 oz)  01/07/15 63.504 kg (140 lb)            Constitutional: NAD Neck: supple, no thyromegaly Respiratory: diffuse rhonchi and rales Cardiovascular: RRR, no murmur, no gallop Abdomen: soft, good BS, nontender Extremities: no edema Neuro: alert -no focal motor or sensory deficits  ASSESSMENT/PLAN:  1 E.coli UTI; On  Levaquin 2 Acute Respiratory failure secondary to Left Lower lobe Pneumonia and Diastolic CHF CXR; Improving Left Basilar infiltrate Continue O2,Lasix and Antibiotics 3 HTN; On Nifedipine and Clonidine 0.1 mg po bid 4 Hypothyroidism; Continue Levothyroxine 5 Weakness; Seen by Physical Therapy-For Rehab to [place] today Pt is DNR

## 2015-03-19 NOTE — Progress Notes (Signed)
Called report to [place] at [facility].  Pt doesn't have an IV to remove as IV was lost on prior shift.  EMS called for transport.  Orson Ape, RN

## 2015-03-19 NOTE — Care Management Important Message (Signed)
Important Message  Patient Details  Name: Anita Ward MRN: [id number] Date of Birth: 1920/07/08   Medicare Important Message Given:  Yes-third notification given    Gwenette Greet, RN 03/19/2015, 8:16 AM

## 2015-03-19 NOTE — Discharge Summary (Signed)
Physician Discharge Summary  Anita Ward JKD:[id number] DOB: March 21, 1920 DOA: 03/12/2015  PCP: Pcp Not In System  Admit date: 03/12/2015 Discharge date: 03/19/2015  Time spent: 35 minutes  Recommendations for Outpatient Follow-up:  1. Discharge to [place] 2. Call office to make appt in 1-2 weeks   Discharge Diagnoses:   1 E.coli UTI (urinary tract infection) 2 Acute respiratory Failure secondary to Pneumonia 3 Diastolic CHF (congestive heart failure) 4 HTN 5 Hypothyroidism 6 Weakness    Discharge Condition: Fair  Diet recommendation: 2 gms sodium  Filed Weights   03/17/15 1416 03/18/15 0648 03/19/15 0452  Weight: 60.918 kg (134 lb 4.8 oz) 59.739 kg (131 lb 11.2 oz) 61.916 kg (136 lb 8 oz)    History of present illness:  Anita Ward is a 81 year old female with a known history of Hypertension, Coronary artery disease, Osteoporosis,glaucoma was admitted [facility] after she presented with nausea vomiting and loose stools associated with generalized weakness. She has a history of recurrent UTIs On admission patient was noted have a sodium 143 potassium 3.4 with being of 31 and creatinine 1.73   Hospital Course:   Patient was admitted [facility] and received intravenous fluids. Hyponatremia corrected. Subsequent pt developed  respiratory failure and needed supplemental O2  Chest x-ray initially showed mild chronic interstitial changes Repeat chest x-ray showed asymmetric left lower lobe airspace disease concerning for pneumonia. There was also evidence of congestive heart failure. Patient received intravenous Lasix. He also had some episodes of confusion which gradually cleared. Her blood pressure was elevated and clonidine was restarted after which her mental status improved. She was started on IV antibiotics for UTI. Urine cultures grew Escherichia coli. Subsequently placed on po  Levaquin. She was seen by  physical therapy and was discharged in stable condition to skilled nursing facility  Consultations:  Pulmonary- Dr. Rich Number  Discharge Exam: Filed Vitals:   03/19/15 0452  BP: 159/67  Pulse: 103  Temp: 97.8 F (36.6 C)  Resp: 20    General: Thin built, Frail and weak Cardiovascular: S1 S2 Respiratory:Decreased air entry left base   Discharge Instructions  Check CBC and Met-b in 2 days   Current Discharge Medication List    START taking these medications   Details  feeding supplement, ENSURE ENLIVE, (ENSURE ENLIVE) LIQD Take 237 mLs by mouth 2 (two) times daily between meals. Qty: 237 mL, Refills: 12    levofloxacin (LEVAQUIN) 750 MG tablet Take 1 tablet (750 mg total) by mouth every other day. Qty: 5 tablet, Refills: 0    potassium chloride 20 MEQ TBCR Take 10 mEq by mouth daily. Qty: 30 tablet, Refills: 3      CONTINUE these medications which have CHANGED   Details  cloNIDine (CATAPRES) 0.2 MG tablet Take 1 tablet (0.2 mg total) by mouth 2 (two) times daily. Qty: 60 tablet, Refills: 3    furosemide (LASIX) 40 MG tablet Take 1 tablet (40 mg total) by mouth daily. Qty: 30 tablet, Refills: 5    losartan (COZAAR) 100 MG tablet Take 0.5 tablets (50 mg total) by mouth daily. Qty: 30 tablet, Refills: 3    metoprolol succinate (TOPROL-XL) 50 MG 24 hr tablet 1 tab po qd Qty: 30 tablet, Refills: 3      CONTINUE these medications which have NOT CHANGED   Details  ALPRAZolam (XANAX) 0.25 MG tablet Take 0.25 mg by mouth 2 (two) times daily as needed for anxiety.    Calcium Carbonate-Vitamin  D 600-200 MG-UNIT TABS Take 1 tablet by mouth 2 (two) times daily.    CRANBERRY PO Take 1 capsule by mouth daily.    isosorbide mononitrate (IMDUR) 30 MG 24 hr tablet Take 30 mg by mouth 2 (two) times daily.    levothyroxine (SYNTHROID, LEVOTHROID) 25 MCG tablet Take 25 mcg by mouth daily before breakfast.    Magnesium 100 MG TABS Take 100 mg by mouth 2 (two) times daily.     NIFEdipine (PROCARDIA-XL/ADALAT-CC/NIFEDICAL-XL) 30 MG 24 hr tablet Take 30 mg by mouth daily.    nitroGLYCERIN (NITROSTAT) 0.4 MG SL tablet Place 0.4 mg under the tongue every 5 (five) minutes as needed for chest pain.    pantoprazole (PROTONIX) 40 MG tablet Take 40 mg by mouth daily.    traMADol (ULTRAM) 50 MG tablet Take 50 mg by mouth 3 (three) times daily as needed for moderate pain.    vitamin B-12 (CYANOCOBALAMIN) 1000 MCG tablet Take 1,000 mcg by mouth daily.       Allergies  Allergen Reactions  . Benicar [Olmesartan] Other (See Comments)    Reaction:  Unknown   . Cardura [Doxazosin Mesylate] Other (See Comments)    Reaction:  Unknown   . Ciprofloxacin Other (See Comments)    Reaction:  Unknown  . Fosamax [Alendronate Sodium] Other (See Comments)    Reaction:  Unknown  . Lisinopril Other (See Comments)    Reaction:  Unknown   . Norvasc [Amlodipine Besylate] Other (See Comments)    Reaction:  Unknown   . Penicillins Other (See Comments)    Reaction:  Unknown   . Sulfa Antibiotics Other (See Comments)    Reaction:  Unknown   . Torsemide Anxiety      The results of significant diagnostics from this hospitalization (including imaging, microbiology, ancillary and laboratory) are listed below for reference.    Significant Diagnostic Studies: Dg Chest 1 View  03/16/2015   CLINICAL DATA:  Respiratory failure.  EXAM: CHEST  1 VIEW  COMPARISON:  One-view chest 03/15/2015  FINDINGS: The heart is enlarged. Chronic interstitial coarsening is again noted. There slight improved aeration in the right lung. Persistent left lower lobe opacities are noted. Pulmonary vascular congestion is noted. Atherosclerotic calcifications are present at the aortic arch.  IMPRESSION: 1. Persistent congestive heart failure. 2. Asymmetric left lower lobe airspace disease is concerning for pneumonia. 3. Aeration of the right lung has improved slightly.   Electronically Signed   By: San Morelle  M.D.   On: 03/16/2015 07:33   Dg Chest 1 View  03/15/2015   CLINICAL DATA:  CHF and coronary artery disease.  EXAM: CHEST  1 VIEW  COMPARISON:  03/13/2015 and prior exams  FINDINGS: Cardiomegaly, CABG changes, pulmonary vascular congestion and interstitial edema again noted.  Right lower lung atelectasis/ airspace disease and left lower lung consolidations/atelectasis again noted.  Bilateral pleural effusions are noted, slightly increased on the left.  There is no evidence of pneumothorax.  IMPRESSION: Slightly increased left pleural effusion otherwise unchanged appearance of the chest with interstitial pulmonary edema, right lower lung atelectasis/ airspace disease, left lower lung consolidations/atelectasis and cardiomegaly.   Electronically Signed   By: Margarette Canada M.D.   On: 03/15/2015 08:01   Dg Chest 1 View  03/13/2015   CLINICAL DATA:  Hypertension and coronary artery disease. Nauseated.  EXAM: CHEST  1 VIEW  COMPARISON:  03/12/2015  FINDINGS: Patient rotated rightward. Sternotomy wires overlie normal cardiac silhouette. There is bibasilar pleural effusion which are  new from prior. Low lung volumes. Mild central venous congestion.  IMPRESSION: Increased central venous congestion and bibasilar effusions.   Electronically Signed   By: Suzy Bouchard M.D.   On: 03/13/2015 09:08   Dg Chest 2 View  03/12/2015   CLINICAL DATA:  Right upper quadrant pain. Fever and low oxygen saturations.  EXAM: CHEST  2 VIEW  COMPARISON:  01/07/2015  FINDINGS: Mild blunting at the costophrenic angles. Heart size is upper limits of normal but stable. Post CABG changes. No pulmonary edema. Atherosclerotic calcifications at the aortic arch. Trachea is midline. There may be small parenchymal densities versus pleural fluid at the posterior lung bases.  IMPRESSION: Mild blunting at the costophrenic angles, left side greater than right. Findings may be related to small effusions. Cannot exclude subtle airspace disease along the  posterior lung bases.   Electronically Signed   By: Markus Daft M.D.   On: 03/12/2015 15:01   Dg Abd 1 View  03/14/2015   CLINICAL DATA:  Nausea and vomiting and abdominal pain for 1 week.  EXAM: ABDOMEN - 1 VIEW  COMPARISON:  05/08/2012 and prior exams  FINDINGS: Moderate stool and gas in the colon noted.  There is no evidence of bowel obstruction.  No suspicious calcifications are identified.  Lumbar scoliosis and degenerative changes again identified. No acute bony abnormalities are noted.  IMPRESSION: No evidence of acute abnormality.  Moderate colonic stool.   Electronically Signed   By: Margarette Canada M.D.   On: 03/14/2015 10:46   Dg Chest Port 1 View  03/17/2015   CLINICAL DATA:  Recent pneumonia, followup examination  EXAM: PORTABLE CHEST - 1 VIEW  COMPARISON:  03/16/2015  FINDINGS: Cardiac shadow is stable but enlarged. Postsurgical changes are again seen. Stable aortic calcifications are noted. The previously seen left basilar infiltrate is again identified but has improved somewhat in the interval from the prior exam. Mild persistent interstitial edema is noted. No other focal abnormality is seen.  IMPRESSION: Persistent but improving left basilar infiltrate.   Electronically Signed   By: Inez Catalina M.D.   On: 03/17/2015 09:43   US Abdomen Limited Ruq  03/12/2015   CLINICAL DATA:  Three-week history of right upper quadrant pain with fever and chills  EXAM: US ABDOMEN LIMITED - RIGHT UPPER QUADRANT  COMPARISON:  None.  FINDINGS: Gallbladder:  Surgically absent.  Common bile duct:  Diameter: 5 mm. No intrahepatic or extrahepatic biliary duct dilatation.  Liver:  No focal lesion identified. Within normal limits in parenchymal echogenicity.  Small right pleural effusion noted.  IMPRESSION: Small right pleural effusion. Gallbladder absent. Study otherwise unremarkable.   Electronically Signed   By: Lowella Grip III M.D.   On: 03/12/2015 16:00    Microbiology: Recent Results (from the past 240  hour(s))  Blood culture (routine x 2)     Status: None   Collection Time: 03/12/15  3:34 PM  Result Value Ref Range Status   Specimen Description BLOOD LEFT ASSIST CONTROL  Final   Special Requests BOTTLES DRAWN AEROBIC AND ANAEROBIC  1CC  Final   Culture  Setup Time   Final    GRAM NEGATIVE RODS IN BOTH AEROBIC AND ANAEROBIC BOTTLES CRITICAL VALUE NOTED.  VALUE IS CONSISTENT WITH PREVIOUSLY REPORTED AND CALLED VALUE. PREVIOUSLY CALLED AT 0418 03/13/15.PMH CONFIRMED BY RW    Culture   Final    ESCHERICHIA COLI IN BOTH AEROBIC AND ANAEROBIC BOTTLES    Report Status 03/15/2015 FINAL  Final  Organism ID, Bacteria ESCHERICHIA COLI  Final      Susceptibility   Escherichia coli - MIC*    AMPICILLIN <=2 SENSITIVE Sensitive     CEFTAZIDIME <=1 SENSITIVE Sensitive     CEFAZOLIN <=4 SENSITIVE Sensitive     CEFTRIAXONE <=1 SENSITIVE Sensitive     CIPROFLOXACIN <=0.25 SENSITIVE Sensitive     GENTAMICIN <=1 SENSITIVE Sensitive     IMIPENEM <=0.25 SENSITIVE Sensitive     TRIMETH/SULFA <=20 SENSITIVE Sensitive     PIP/TAZO Value in next row Sensitive      SENSITIVE<=4    AMPICILLIN/SULBACTAM Value in next row Sensitive      SENSITIVE<=2    ERTAPENEM Value in next row Sensitive      SENSITIVE<=0.5    * ESCHERICHIA COLI  Blood culture (routine x 2)     Status: None   Collection Time: 03/12/15  3:34 PM  Result Value Ref Range Status   Specimen Description BLOOD RIGHT ASSIST CONTROL  Final   Special Requests   Final    BOTTLES DRAWN AEROBIC AND ANAEROBIC  AER 4CC ANA 3CC   Culture  Setup Time   Final    GRAM NEGATIVE RODS IN BOTH AEROBIC AND ANAEROBIC BOTTLES CRITICAL RESULT CALLED TO, READ BACK BY AND VERIFIED WITH: [facility] AT 6812 03/13/15.PMH CONFIRMED BY RW    Culture   Final    ESCHERICHIA COLI IN BOTH AEROBIC AND ANAEROBIC BOTTLES    Report Status 03/15/2015 FINAL  Final   Organism ID, Bacteria ESCHERICHIA COLI  Final      Susceptibility   Escherichia coli - MIC*     AMPICILLIN <=2 SENSITIVE Sensitive     CEFTAZIDIME <=1 SENSITIVE Sensitive     CEFAZOLIN <=4 SENSITIVE Sensitive     CEFTRIAXONE <=1 SENSITIVE Sensitive     CIPROFLOXACIN <=0.25 SENSITIVE Sensitive     GENTAMICIN <=1 SENSITIVE Sensitive     IMIPENEM <=0.25 SENSITIVE Sensitive     TRIMETH/SULFA <=20 SENSITIVE Sensitive     PIP/TAZO Value in next row Sensitive      SENSITIVE<=4    AMPICILLIN/SULBACTAM Value in next row Sensitive      SENSITIVE<=2    ERTAPENEM Value in next row Sensitive      SENSITIVE<=0.5    * ESCHERICHIA COLI  Urine culture     Status: None   Collection Time: 03/12/15  4:33 PM  Result Value Ref Range Status   Specimen Description URINE, RANDOM  Final   Special Requests NONE  Final   Culture >=100,000 COLONIES/mL ESCHERICHIA COLI  Final   Report Status 03/14/2015 FINAL  Final   Organism ID, Bacteria ESCHERICHIA COLI  Final      Susceptibility   Escherichia coli - MIC*    AMPICILLIN <=2 SENSITIVE Sensitive     CEFTAZIDIME <=1 SENSITIVE Sensitive     CEFAZOLIN <=4 SENSITIVE Sensitive     CEFTRIAXONE <=1 SENSITIVE Sensitive     CIPROFLOXACIN <=0.25 SENSITIVE Sensitive     GENTAMICIN <=1 SENSITIVE Sensitive     IMIPENEM <=0.25 SENSITIVE Sensitive     TRIMETH/SULFA <=20 SENSITIVE Sensitive     PIP/TAZO Value in next row Sensitive      SENSITIVE<=4    * >=100,000 COLONIES/mL ESCHERICHIA COLI  C difficile quick scan w PCR reflex     Status: None   Collection Time: 03/15/15  6:36 PM  Result Value Ref Range Status   C Diff antigen NEGATIVE NEGATIVE Final   C Diff toxin NEGATIVE NEGATIVE Final  C Diff interpretation Negative for C. difficile  Final     Labs: Basic Metabolic Panel:  Recent Labs Lab 03/15/15 0500 03/16/15 0454 03/17/15 0503 03/18/15 0514 03/19/15 0532  NA 131* 132* 134* 134* 136  K 4.7 4.6 4.7 4.7 4.7  CL 95* 95* 98* 98* 103  CO2 _0 GLUCOSE 122* 92 90 108* 102*  BUN _1 21* 25*  CREATININE 1.01* 0.92 0.96 0.94 0.90   CALCIUM 8.4* 8.6* 8.9 9.2 9.0   Liver Function Tests:  Recent Labs Lab 03/12/15 1424  AST 40  ALT 20  ALKPHOS 75  BILITOT 0.8  PROT 6.9  ALBUMIN 3.0*    Recent Labs Lab 03/12/15 1424  LIPASE 12*   No results for input(s): AMMONIA in the last 168 hours. CBC:  Recent Labs Lab 03/15/15 0500 03/16/15 0454 03/17/15 0503 03/18/15 0514 03/19/15 0532  WBC 24.2* 20.5* 17.6* 21.1* 21.5*  NEUTROABS 20.8* 16.6* 13.9* 17.5* 18.0*  HGB 13.0 12.5 13.3 13.4 12.7  HCT 39.0 38.3 40.5 39.9 38.8  MCV 77.2* 77.2* 78.6* 78.3* 77.9*  PLT 324 401 467* 505* 504*   Cardiac Enzymes: No results for input(s): CKTOTAL, CKMB, CKMBINDEX, TROPONINI in the last 168 hours. BNP: BNP (last 3 results) No results for input(s): BNP in the last 8760 hours.  ProBNP (last 3 results) No results for input(s): PROBNP in the last 8760 hours.  CBG: No results for input(s): GLUCAP in the last 168 hours.     SignedTracie Harrier   03/19/2015, 8:23 AM

## 2015-03-19 NOTE — Plan of Care (Signed)
Problem: Discharge Progression Outcomes Goal: Other Discharge Outcomes/Goals Outcome: Progressing Pt is alert and oriented, no c/o pain at this time. 1 assist to the [facility]. Neighbor at bedside in the evening, supportive of patient. Continues to break out in sweats and feels cold. Possible d/c today to [facility].

## 2015-03-19 NOTE — Clinical Social Work Placement (Signed)
   CLINICAL SOCIAL WORK PLACEMENT  NOTE  Date:  03/19/2015  Patient Details  Name: Anita Ward MRN: [id number] Date of Birth: 1920-04-19  Clinical Social Work is seeking post-discharge placement for this patient at the Skilled  Nursing Facility level of care (*CSW will initial, date and re-position this form in  chart as items are completed):  Yes   Patient/family provided with [facility] Clinical Social Work Department's list of facilities offering this level of care within the geographic area requested by the patient (or if unable, by the patient's family).  Yes   Patient/family informed of their freedom to choose among providers that offer the needed level of care, that participate in Medicare, Medicaid or managed care program needed by the patient, have an available bed and are willing to accept the patient.  Yes   Patient/family informed of [facility]'s ownership interest in [facility] and [facility], as well as of the fact that they are under no obligation to receive care at these facilities.  PASRR submitted to EDS on 03/14/15     PASRR number received on 03/14/15     Existing PASRR number confirmed on       FL2 transmitted to all facilities in geographic area requested by pt/family on 03/14/15     FL2 transmitted to all facilities within larger geographic area on       Patient informed that his/her managed care company has contracts with or will negotiate with certain facilities, including the following:        Yes   Patient/family informed of bed offers received.  Patient chooses bed at  [facility] )     Physician recommends and patient chooses bed at      Patient to be transferred to  [facility] ) on 03/19/15.  Patient to be transferred to facility by  [facility] )     Patient family notified on 03/19/15 of transfer.  Name of family member notified:   (Son Deniece Portela and grandson Reuel Boom are aware of D/C today. )     PHYSICIAN        Additional Comment:    _______________________________________________ Haig Prophet, LCSW 03/19/2015, 10:08 AM

## 2015-03-23 DIAGNOSIS — N390 Urinary tract infection, site not specified: Secondary | ICD-10-CM

## 2015-03-23 DIAGNOSIS — I5032 Chronic diastolic (congestive) heart failure: Secondary | ICD-10-CM

## 2015-03-23 DIAGNOSIS — J181 Lobar pneumonia, unspecified organism: Secondary | ICD-10-CM | POA: Diagnosis not present

## 2015-03-23 DIAGNOSIS — K219 Gastro-esophageal reflux disease without esophagitis: Secondary | ICD-10-CM | POA: Diagnosis not present

## 2015-03-23 DIAGNOSIS — I25119 Atherosclerotic heart disease of native coronary artery with unspecified angina pectoris: Secondary | ICD-10-CM | POA: Diagnosis not present

## 2015-03-26 DIAGNOSIS — R05 Cough: Secondary | ICD-10-CM | POA: Diagnosis not present

## 2015-04-13 DIAGNOSIS — I2510 Atherosclerotic heart disease of native coronary artery without angina pectoris: Secondary | ICD-10-CM | POA: Diagnosis not present

## 2015-04-13 DIAGNOSIS — I5030 Unspecified diastolic (congestive) heart failure: Secondary | ICD-10-CM | POA: Diagnosis not present

## 2015-04-13 DIAGNOSIS — I10 Essential (primary) hypertension: Secondary | ICD-10-CM

## 2015-04-13 DIAGNOSIS — M6281 Muscle weakness (generalized): Secondary | ICD-10-CM | POA: Diagnosis not present

## 2015-05-05 ENCOUNTER — Encounter: Payer: Self-pay | Admitting: *Deleted

## 2015-05-05 DIAGNOSIS — Z880 Allergy status to penicillin: Secondary | ICD-10-CM | POA: Diagnosis not present

## 2015-05-05 DIAGNOSIS — Z79899 Other long term (current) drug therapy: Secondary | ICD-10-CM | POA: Diagnosis not present

## 2015-05-05 DIAGNOSIS — L03314 Cellulitis of groin: Secondary | ICD-10-CM | POA: Insufficient documentation

## 2015-05-05 DIAGNOSIS — I10 Essential (primary) hypertension: Secondary | ICD-10-CM | POA: Diagnosis not present

## 2015-05-05 DIAGNOSIS — R21 Rash and other nonspecific skin eruption: Secondary | ICD-10-CM | POA: Diagnosis present

## 2015-05-05 NOTE — ED Notes (Signed)
Pt reports multiple open "abscesses" to vaginal area, painful. Pt reports came from panties she wore while in rehab. Reports knots that are spreading 2-3 days.

## 2015-05-06 ENCOUNTER — Emergency Department
Admission: EM | Admit: 2015-05-06 | Discharge: 2015-05-06 | Disposition: A | Discharge status: Home / Self Care | Payer: Medicare Other | Attending: Emergency Medicine | Admitting: Emergency Medicine

## 2015-05-06 DIAGNOSIS — L03314 Cellulitis of groin: Secondary | ICD-10-CM | POA: Diagnosis not present

## 2015-05-06 MED ORDER — CEPHALEXIN 500 MG PO CAPS
500.0000 mg | ORAL_CAPSULE | Freq: Once | ORAL | Status: AC
  Administered 2015-05-06: 500 mg via ORAL
  Filled 2015-05-06: qty 1

## 2015-05-06 MED ORDER — CEPHALEXIN 500 MG PO CAPS: 500.0000 mg | ORAL_CAPSULE | Freq: Three times a day (TID) | ORAL | Status: DC

## 2015-05-06 NOTE — ED Provider Notes (Signed)
[facility] Emergency Department Provider Note  ____________________________________________  Time seen: 12:40 AM  I have reviewed the triage vital signs and the nursing notes.   HISTORY  Chief Complaint Rash    HPI Anita Ward is a 81 y.o. female who complains of redness and discomfort of the groin that going on for about 2 weeks but worsening. She also noticed some bumpy knots in the skin that seem to be spreading. Denies any fever chills chest pain abdominal pain nausea vomiting or diarrhea. Normal oral intake and urine output. Chronic shortness of breath that is unchanged. He has an appointment with Dr. Pamala Hurry her primary care doctor in about 10 days     Past Medical History  Diagnosis Date  . HTN (hypertension)   . Coronary artery disease   . Glaucoma   . Osteoporosis    hypothyroidism CAD CHF   Patient Active Problem List   Diagnosis Date Noted  . CHF (congestive heart failure) (HCC)   . UTI (urinary tract infection) 03/12/2015  . UTI (lower urinary tract infection) 03/12/2015     Past Surgical History  Procedure Laterality Date  . Cardiac surgery    . Cardiac surgery       Current Outpatient Rx  Name  Route  Sig  Dispense  Refill  . ALPRAZolam (XANAX) 0.25 MG tablet   Oral   Take 0.25 mg by mouth 2 (two) times daily as needed for anxiety.         . Calcium Carbonate-Vitamin D 600-200 MG-UNIT TABS   Oral   Take 1 tablet by mouth 2 (two) times daily.         . cephALEXin (KEFLEX) 500 MG capsule   Oral   Take 1 capsule (500 mg total) by mouth 3 (three) times daily.   21 capsule   0   . cloNIDine (CATAPRES) 0.2 MG tablet   Oral   Take 1 tablet (0.2 mg total) by mouth 2 (two) times daily.   60 tablet   3   . CRANBERRY PO   Oral   Take 1 capsule by mouth daily.         . feeding supplement, ENSURE ENLIVE, (ENSURE ENLIVE) LIQD   Oral   Take 237 mLs by mouth 2 (two) times daily between meals.   237 mL   12    . furosemide (LASIX) 40 MG tablet   Oral   Take 1 tablet (40 mg total) by mouth daily.   30 tablet   5   . isosorbide mononitrate (IMDUR) 30 MG 24 hr tablet   Oral   Take 30 mg by mouth 2 (two) times daily.         Marland Kitchen levofloxacin (LEVAQUIN) 750 MG tablet   Oral   Take 1 tablet (750 mg total) by mouth every other day.   5 tablet   0   . levothyroxine (SYNTHROID, LEVOTHROID) 25 MCG tablet   Oral   Take 25 mcg by mouth daily before breakfast.         . losartan (COZAAR) 100 MG tablet   Oral   Take 0.5 tablets (50 mg total) by mouth daily.   30 tablet   3   . Magnesium 100 MG TABS   Oral   Take 100 mg by mouth 2 (two) times daily.         . metoprolol succinate (TOPROL-XL) 50 MG 24 hr tablet      1 tab po  qd   30 tablet   3   . NIFEdipine (PROCARDIA-XL/ADALAT-CC/NIFEDICAL-XL) 30 MG 24 hr tablet   Oral   Take 30 mg by mouth daily.         . nitroGLYCERIN (NITROSTAT) 0.4 MG SL tablet   Sublingual   Place 0.4 mg under the tongue every 5 (five) minutes as needed for chest pain.         . pantoprazole (PROTONIX) 40 MG tablet   Oral   Take 40 mg by mouth daily.         . potassium chloride 20 MEQ TBCR   Oral   Take 10 mEq by mouth daily.   30 tablet   3   . traMADol (ULTRAM) 50 MG tablet   Oral   Take 50 mg by mouth 3 (three) times daily as needed for moderate pain.         . vitamin B-12 (CYANOCOBALAMIN) 1000 MCG tablet   Oral   Take 1,000 mcg by mouth daily.            Allergies Benicar; Cardura; Ciprofloxacin; Fosamax; Lisinopril; Norvasc; Penicillins; Sulfa antibiotics; and Torsemide   Family History  Problem Relation Age of Onset  . Congestive Heart Failure Father   . CAD Brother     Social History Social History  Substance Use Topics  . Smoking status: Never Smoker   . Smokeless tobacco: None  . Alcohol Use: No    Review of Systems  Constitutional:   No fever or chills. No weight changes Eyes:   No blurry vision or  double vision.  ENT:   No sore throat. Cardiovascular:   No chest pain. Respiratory:   No dyspnea or cough. Gastrointestinal:   Negative for abdominal pain, vomiting and diarrhea.  No BRBPR or melena. Genitourinary:   Negative for dysuria, urinary retention, bloody urine, or difficulty urinating. Musculoskeletal:   Negative for back pain. No joint swelling or pain. Skin:   Positive rash Neurological:   Negative for headaches, focal weakness or numbness. Psychiatric:  No anxiety or depression.   Endocrine:  No hot/cold intolerance, changes in energy, or sleep difficulty.  10-point ROS otherwise negative.  ____________________________________________   PHYSICAL EXAM:  VITAL SIGNS: ED Triage Vitals  Enc Vitals Group     BP 05/05/15 2227 170/70 mmHg     Pulse Rate 05/05/15 2227 91     Resp 05/05/15 2227 18     Temp 05/05/15 2227 98.1 F (36.7 C)     Temp Source 05/05/15 2227 Oral     SpO2 05/05/15 2227 96 %     Weight 05/05/15 2227 130 lb (58.968 kg)     Height 05/05/15 2227 5' (1.524 m)     Head Cir --      Peak Flow --      Pain Score 05/05/15 2226 7     Pain Loc --      Pain Edu? --      Excl. in GC? --      Constitutional:   Alert and oriented. Well appearing and in no distress. Eyes:   No scleral icterus. No conjunctival pallor. PERRL. EOMI ENT   Head:   Normocephalic and atraumatic.   Nose:   No congestion/rhinnorhea. No septal hematoma   Mouth/Throat:   MMM, no pharyngeal erythema. No peritonsillar mass. No uvula shift.   Neck:   No stridor. No SubQ emphysema. No meningismus. Hematological/Lymphatic/Immunilogical:   No cervical lymphadenopathy. Cardiovascular:   RRR. Normal and  symmetric distal pulses are present in all extremities. No murmurs, rubs, or gallops. Respiratory:   Normal respiratory effort without tachypnea nor retractions. Breath sounds are clear and equal bilaterally. No wheezes/rales/rhonchi. Gastrointestinal:   Soft and nontender. No  distention. No mass. There is no CVA tenderness.  No rebound, rigidity, or guarding. Genitourinary:   deferred Musculoskeletal:   Nontender with normal range of motion in all extremities. No joint effusions.  No lower extremity tenderness.  No edema. Neurologic:   Normal speech and language.  CN 2-10 normal. Motor grossly intact. No pronator drift.  Normal gait. No gross focal neurologic deficits are appreciated.  Skin:    Skin is warm, dry and intact. There is redness of the labia majora bilaterally extending upward toward the mons. There also 3 or 4 scattered 1 cm raised bumps that are nontender, nondraining. No fluctuance or evidence of abscess. No crepitus.  No petechiae, purpura, or bullae. Psychiatric:   Mood and affect are normal. Speech and behavior are normal. Patient exhibits appropriate insight and judgment.  ____________________________________________    LABS (pertinent positives/negatives) (all labs ordered are listed, but only abnormal results are displayed) Labs Reviewed - No data to display ____________________________________________   EKG    ____________________________________________    RADIOLOGY    ____________________________________________   PROCEDURES   ____________________________________________   INITIAL IMPRESSION / ASSESSMENT AND PLAN / ED COURSE  Pertinent labs & imaging results that were available during my care of the patient were reviewed by me and considered in my medical decision making (see chart for details).  Patient very well appearing presents with a mild cellulitis of the skin of the groin. She has multiple antibiotic allergies, unable to ascertain exactly what reactions they are. Was started on Keflex as this appears to be a safe choice and have her follow-up with Dr. Marcello Fennel as scheduled in about a week and a half.     ____________________________________________   FINAL CLINICAL IMPRESSION(S) / ED DIAGNOSES  Final  diagnoses:  Cellulitis of groin      Sharman Cheek, MD 05/06/15 [phone]

## 2015-05-06 NOTE — Discharge Instructions (Signed)

## 2015-05-06 NOTE — ED Notes (Signed)
Pt reports rash on abdomen, groin, and vaginal area.  States abscesses appeared after wearing diapers while in rehab.  Vaginal area with redness and abscess noted.  No drainage.

## 2015-05-12 ENCOUNTER — Emergency Department
Admission: EM | Admit: 2015-05-12 | Discharge: 2015-05-12 | Disposition: A | Discharge status: Home / Self Care | Payer: Medicare Other | Attending: Emergency Medicine | Admitting: Emergency Medicine

## 2015-05-12 ENCOUNTER — Encounter: Payer: Self-pay | Admitting: Emergency Medicine

## 2015-05-12 DIAGNOSIS — Z79899 Other long term (current) drug therapy: Secondary | ICD-10-CM | POA: Diagnosis not present

## 2015-05-12 DIAGNOSIS — B3789 Other sites of candidiasis: Secondary | ICD-10-CM

## 2015-05-12 DIAGNOSIS — R21 Rash and other nonspecific skin eruption: Secondary | ICD-10-CM

## 2015-05-12 DIAGNOSIS — Z792 Long term (current) use of antibiotics: Secondary | ICD-10-CM | POA: Insufficient documentation

## 2015-05-12 DIAGNOSIS — I10 Essential (primary) hypertension: Secondary | ICD-10-CM | POA: Diagnosis not present

## 2015-05-12 DIAGNOSIS — B379 Candidiasis, unspecified: Secondary | ICD-10-CM | POA: Insufficient documentation

## 2015-05-12 DIAGNOSIS — Z880 Allergy status to penicillin: Secondary | ICD-10-CM | POA: Diagnosis not present

## 2015-05-12 MED ORDER — FLUCONAZOLE 50 MG PO TABS
150.0000 mg | ORAL_TABLET | Freq: Once | ORAL | Status: AC
  Administered 2015-05-12: 150 mg via ORAL
  Filled 2015-05-12: qty 1

## 2015-05-12 MED ORDER — NYSTATIN 100000 UNIT/GM EX POWD: CUTANEOUS | Status: DC

## 2015-05-12 NOTE — ED Provider Notes (Signed)
[facility] Emergency Department Provider Note  Time seen: 10:12 AM  I have reviewed the triage vital signs and the nursing notes.   HISTORY  Chief Complaint Cellulitis    HPI Anita Ward is a 81 y.o. female with a past medical history of hypertension, glaucoma, CHF, CAD, presents the emergency department with a groin rash. According to the patient she first developed this rash approximately one week ago, was seen in the emergency department 05/06/15 and placed on Keflex for likely cellulitis. She states she has one pill of Keflex left and the rash has not resolved so she came back to the emergency department. States slight itching, denies pain. Describes the discomfort as mild. States he only really just came back to the emergency department discussed the rash did not resolve and her antibiotics are almost gone. Denies fever, nausea, vomiting.    Past Medical History  Diagnosis Date  . HTN (hypertension)   . Coronary artery disease   . Glaucoma   . Osteoporosis     Patient Active Problem List   Diagnosis Date Noted  . CHF (congestive heart failure) (HCC)   . UTI (urinary tract infection) 03/12/2015  . UTI (lower urinary tract infection) 03/12/2015    Past Surgical History  Procedure Laterality Date  . Cardiac surgery    . Cardiac surgery      Current Outpatient Rx  Name  Route  Sig  Dispense  Refill  . ALPRAZolam (XANAX) 0.25 MG tablet   Oral   Take 0.25 mg by mouth 2 (two) times daily as needed for anxiety.         . Calcium Carbonate-Vitamin D 600-200 MG-UNIT TABS   Oral   Take 1 tablet by mouth 2 (two) times daily.         . cephALEXin (KEFLEX) 500 MG capsule   Oral   Take 1 capsule (500 mg total) by mouth 3 (three) times daily.   21 capsule   0   . cloNIDine (CATAPRES) 0.2 MG tablet   Oral   Take 1 tablet (0.2 mg total) by mouth 2 (two) times daily.   60 tablet   3   . CRANBERRY PO   Oral   Take 1 capsule by mouth  daily.         . feeding supplement, ENSURE ENLIVE, (ENSURE ENLIVE) LIQD   Oral   Take 237 mLs by mouth 2 (two) times daily between meals.   237 mL   12   . furosemide (LASIX) 40 MG tablet   Oral   Take 1 tablet (40 mg total) by mouth daily.   30 tablet   5   . isosorbide mononitrate (IMDUR) 30 MG 24 hr tablet   Oral   Take 30 mg by mouth 2 (two) times daily.         Marland Kitchen levofloxacin (LEVAQUIN) 750 MG tablet   Oral   Take 1 tablet (750 mg total) by mouth every other day.   5 tablet   0   . levothyroxine (SYNTHROID, LEVOTHROID) 25 MCG tablet   Oral   Take 25 mcg by mouth daily before breakfast.         . losartan (COZAAR) 100 MG tablet   Oral   Take 0.5 tablets (50 mg total) by mouth daily.   30 tablet   3   . Magnesium 100 MG TABS   Oral   Take 100 mg by mouth 2 (two) times daily.         Marland Kitchen  metoprolol succinate (TOPROL-XL) 50 MG 24 hr tablet      1 tab po qd   30 tablet   3   . NIFEdipine (PROCARDIA-XL/ADALAT-CC/NIFEDICAL-XL) 30 MG 24 hr tablet   Oral   Take 30 mg by mouth daily.         . nitroGLYCERIN (NITROSTAT) 0.4 MG SL tablet   Sublingual   Place 0.4 mg under the tongue every 5 (five) minutes as needed for chest pain.         . pantoprazole (PROTONIX) 40 MG tablet   Oral   Take 40 mg by mouth daily.         . potassium chloride 20 MEQ TBCR   Oral   Take 10 mEq by mouth daily.   30 tablet   3   . traMADol (ULTRAM) 50 MG tablet   Oral   Take 50 mg by mouth 3 (three) times daily as needed for moderate pain.         . vitamin B-12 (CYANOCOBALAMIN) 1000 MCG tablet   Oral   Take 1,000 mcg by mouth daily.           Allergies Benicar; Cardura; Ciprofloxacin; Fosamax; Lisinopril; Norvasc; Penicillins; Sulfa antibiotics; and Torsemide  Family History  Problem Relation Age of Onset  . Congestive Heart Failure Father   . CAD Brother     Social History Social History  Substance Use Topics  . Smoking status: Never Smoker   .  Smokeless tobacco: None  . Alcohol Use: No    Review of Systems Constitutional: Negative for fever Cardiovascular: Negative for chest pain. Respiratory: Negative for shortness of breath. Gastrointestinal: Negative for abdominal pain, vomiting and diarrhea. Genitourinary: Negative for dysuria. Neurological: Negative for headache 10-point ROS otherwise negative.  ____________________________________________   PHYSICAL EXAM:  VITAL SIGNS: ED Triage Vitals  Enc Vitals Group     BP 05/12/15 0921 147/65 mmHg     Pulse Rate 05/12/15 0921 90     Resp 05/12/15 0921 16     Temp 05/12/15 0921 98.1 F (36.7 C)     Temp Source 05/12/15 0921 Oral     SpO2 05/12/15 0921 96 %     Weight 05/12/15 0921 135 lb (61.236 kg)     Height 05/12/15 0921  (1.499 m)     Head Cir --      Peak Flow --      Pain Score 05/12/15 0923 0     Pain Loc --      Pain Edu? --      Excl. in GC? --    Constitutional: Alert and oriented. Well appearing and in no distress. Eyes: Normal exam ENT   Head: Normocephalic and atraumatic.   Mouth/Throat: Mucous membranes are moist. Cardiovascular: Normal rate, regular rhythm. No murmur Respiratory: Normal respiratory effort without tachypnea nor retractions. Breath sounds are clear Gastrointestinal: Soft and nontender. No distention. External GU exam shows mild erythema, no signs of abscess. There are several small satellite lesions. Overall very mild rash. Musculoskeletal: Nontender with normal range of motion in all extremities. Neurologic:  Normal speech and language. No gross focal neurologic deficits are appreciated. Speech is normal. Skin:  Skin is warm, dry and intact.  Psychiatric: Mood and affect are normal. Speech and behavior are normal. Patient exhibits appropriate insight and judgment.  ____________________________________________    INITIAL IMPRESSION / ASSESSMENT AND PLAN / ED COURSE  Pertinent labs & imaging results that were  available during my care of  the patient were reviewed by me and considered in my medical decision making (see chart for details).  Patient presents for a groin rash, no better with Keflex. Patient does have mild erythema of the months. This, with several small surrounding satellite lesions most consistent with a fungal infection. We will dose Diflucan, and prescribed a nystatin powder. Patient had lab work done by her primary care physician yesterday, I reviewed the records through Epic care everywhere, showing a normal white blood cell count, slightly elevated creatinine but otherwise labs are at her baseline.  ____________________________________________   FINAL CLINICAL IMPRESSION(S) / ED DIAGNOSES  Candida rash   Minna Antis, MD 05/12/15 1017

## 2015-05-12 NOTE — Discharge Instructions (Signed)
Please use your antifungal powder as prescribed for the next 7 days. Please try to keep groin area as dry as possible. Please follow-up with her primary care physician in one to 2 days for recheck.    Cutaneous Candidiasis Cutaneous candidiasis is a condition in which there is an overgrowth of yeast (candida) on the skin. Yeast normally live on the skin, but in small enough numbers not to cause any symptoms. In certain cases, increased growth of the yeast may cause an actual yeast infection. This kind of infection usually occurs in areas of the skin that are constantly warm and moist, such as the armpits or the groin. Yeast is the most common cause of diaper rash in babies and in people who cannot control their bowel movements (incontinence). CAUSES  The fungus that most often causes cutaneous candidiasis is Candida albicans. Conditions that can increase the risk of getting a yeast infection of the skin include:  Obesity.  Pregnancy.  Diabetes.  Taking antibiotic medicine.  Taking birth control pills.  Taking steroid medicines.  Thyroid disease.  An iron or zinc deficiency.  Problems with the immune system. SYMPTOMS   Red, swollen area of the skin.  Bumps on the skin.  Itchiness. DIAGNOSIS  The diagnosis of cutaneous candidiasis is usually based on its appearance. Light scrapings of the skin may also be taken and viewed under a microscope to identify the presence of yeast. TREATMENT  Antifungal creams may be applied to the infected skin. In severe cases, oral medicines may be needed.  HOME CARE INSTRUCTIONS   Keep your skin clean and dry.  Maintain a healthy weight.  If you have diabetes, keep your blood sugar under control. SEEK IMMEDIATE MEDICAL CARE IF:  Your rash continues to spread despite treatment.  You have a fever, chills, or abdominal pain.   This information is not intended to replace advice given to you by your health care provider. Make sure you discuss  any questions you have with your health care provider.   Document Released: 03/14/2011 Document Revised: 09/18/2011 Document Reviewed: 12/28/2014 Elsevier Interactive Patient Education [employer]

## 2015-05-12 NOTE — ED Notes (Signed)
Patient with complaints of rash between legs that has gotten worse.  Patient is on last dose of cephalexin 500 mg.

## 2015-05-12 NOTE — ED Notes (Signed)
Pt to triage for rash in groin that is spreading. Pt states that she was diagnosed with cellulitis in groin by dr Marcello Fennel. Pt taking antibiotic.

## 2015-05-15 ENCOUNTER — Emergency Department: Payer: Medicare Other

## 2015-05-15 ENCOUNTER — Emergency Department
Admission: EM | Admit: 2015-05-15 | Discharge: 2015-05-15 | Disposition: A | Discharge status: Home / Self Care | Payer: Medicare Other | Attending: Emergency Medicine | Admitting: Emergency Medicine

## 2015-05-15 DIAGNOSIS — Z79899 Other long term (current) drug therapy: Secondary | ICD-10-CM | POA: Diagnosis not present

## 2015-05-15 DIAGNOSIS — Z792 Long term (current) use of antibiotics: Secondary | ICD-10-CM | POA: Insufficient documentation

## 2015-05-15 DIAGNOSIS — R110 Nausea: Secondary | ICD-10-CM | POA: Diagnosis not present

## 2015-05-15 DIAGNOSIS — R079 Chest pain, unspecified: Secondary | ICD-10-CM | POA: Diagnosis present

## 2015-05-15 DIAGNOSIS — Z880 Allergy status to penicillin: Secondary | ICD-10-CM | POA: Insufficient documentation

## 2015-05-15 DIAGNOSIS — R1013 Epigastric pain: Secondary | ICD-10-CM | POA: Insufficient documentation

## 2015-05-15 DIAGNOSIS — I10 Essential (primary) hypertension: Secondary | ICD-10-CM | POA: Insufficient documentation

## 2015-05-15 DIAGNOSIS — R1010 Upper abdominal pain, unspecified: Secondary | ICD-10-CM

## 2015-05-15 LAB — CBC
HEMATOCRIT: 36 % (ref 35.0–47.0)
HEMOGLOBIN: 11.7 g/dL — AB (ref 12.0–16.0)
MCH: 25.1 pg — AB (ref 26.0–34.0)
MCHC: 32.4 g/dL (ref 32.0–36.0)
MCV: 77.6 fL — AB (ref 80.0–100.0)
Platelets: 362 10*3/uL (ref 150–440)
RBC: 4.65 MIL/uL (ref 3.80–5.20)
RDW: 15.5 % — ABNORMAL HIGH (ref 11.5–14.5)
WBC: 12.8 10*3/uL — ABNORMAL HIGH (ref 3.6–11.0)

## 2015-05-15 LAB — URINALYSIS COMPLETE WITH MICROSCOPIC (ARMC ONLY)
BILIRUBIN URINE: NEGATIVE
Bacteria, UA: NONE SEEN
GLUCOSE, UA: NEGATIVE mg/dL
Hgb urine dipstick: NEGATIVE
KETONES UR: NEGATIVE mg/dL
Leukocytes, UA: NEGATIVE
NITRITE: NEGATIVE
Protein, ur: NEGATIVE mg/dL
SPECIFIC GRAVITY, URINE: 1.008 (ref 1.005–1.030)
pH: 7 (ref 5.0–8.0)

## 2015-05-15 LAB — BASIC METABOLIC PANEL
ANION GAP: 6 (ref 5–15)
BUN: 22 mg/dL — ABNORMAL HIGH (ref 6–20)
CALCIUM: 9.4 mg/dL (ref 8.9–10.3)
CHLORIDE: 92 mmol/L — AB (ref 101–111)
CO2: 32 mmol/L (ref 22–32)
Creatinine, Ser: 1.53 mg/dL — ABNORMAL HIGH (ref 0.44–1.00)
GFR calc Af Amer: 33 mL/min — ABNORMAL LOW (ref >60)
GFR calc non Af Amer: 28 mL/min — ABNORMAL LOW (ref >60)
GLUCOSE: 123 mg/dL — AB (ref 65–99)
Potassium: 3.8 mmol/L (ref 3.5–5.1)
Sodium: 130 mmol/L — ABNORMAL LOW (ref 135–145)

## 2015-05-15 LAB — HEPATIC FUNCTION PANEL
ALK PHOS: 59 U/L (ref 38–126)
ALT: 12 U/L — AB (ref 14–54)
AST: 20 U/L (ref 15–41)
Albumin: 3.4 g/dL — ABNORMAL LOW (ref 3.5–5.0)
BILIRUBIN TOTAL: 0.5 mg/dL (ref 0.3–1.2)
Total Protein: 7.7 g/dL (ref 6.5–8.1)

## 2015-05-15 LAB — TROPONIN I

## 2015-05-15 LAB — LIPASE, BLOOD: Lipase: 39 U/L (ref 11–51)

## 2015-05-15 MED ORDER — ONDANSETRON HCL 4 MG/2ML IJ SOLN
4.0000 mg | Freq: Once | INTRAMUSCULAR | Status: AC
  Administered 2015-05-15: 4 mg via INTRAVENOUS
  Filled 2015-05-15: qty 2

## 2015-05-15 MED ORDER — POLYETHYLENE GLYCOL 3350 17 G PO PACK: 17.0000 g | PACK | Freq: Every day | ORAL | Status: DC

## 2015-05-15 MED ORDER — IOHEXOL 240 MG/ML SOLN
25.0000 mL | Freq: Once | INTRAMUSCULAR | Status: AC | PRN
  Administered 2015-05-15: 25 mL via ORAL

## 2015-05-15 MED ORDER — FLEET ENEMA 7-19 GM/118ML RE ENEM: 1.0000 | ENEMA | Freq: Once | RECTAL | Status: DC

## 2015-05-15 MED ORDER — PANTOPRAZOLE SODIUM 40 MG IV SOLR
40.0000 mg | Freq: Once | INTRAVENOUS | Status: AC
  Administered 2015-05-15: 40 mg via INTRAVENOUS
  Filled 2015-05-15: qty 40

## 2015-05-15 NOTE — ED Notes (Signed)
Pt reports she has been having swelling in her abd and pain under her left breast that started around 2 pm on Friday. Pt recently has been seen here for abscesses and a rash and was on an antibotic. Pt thinks this is what is causing her abd to swell and her chest pain.

## 2015-05-15 NOTE — Discharge Instructions (Signed)
Return to the ER for worsening symptoms, persistent vomiting, difficulty breathing or other concerns. ° °Abdominal Pain, Adult °Many things can cause abdominal pain. Usually, abdominal pain is not caused by a disease and will improve without treatment. It can often be observed and treated at home. Your health care provider will do a physical exam and possibly order blood tests and X-rays to help determine the seriousness of your pain. However, in many cases, more time must pass before a clear cause of the pain can be found. Before that point, your health care provider may not know if you need more testing or further treatment. °HOME CARE INSTRUCTIONS °Monitor your abdominal pain for any changes. The following actions may help to alleviate any discomfort you are experiencing: °· Only take over-the-counter or prescription medicines as directed by your health care provider. °· Do not take laxatives unless directed to do so by your health care provider. °· Try a clear liquid diet (broth, tea, or water) as directed by your health care provider. Slowly move to a bland diet as tolerated. °SEEK MEDICAL CARE IF: °· You have unexplained abdominal pain. °· You have abdominal pain associated with nausea or diarrhea. °· You have pain when you urinate or have a bowel movement. °· You experience abdominal pain that wakes you in the night. °· You have abdominal pain that is worsened or improved by eating food. °· You have abdominal pain that is worsened with eating fatty foods. °· You have a fever. °SEEK IMMEDIATE MEDICAL CARE IF: °· Your pain does not go away within 2 hours. °· You keep throwing up (vomiting). °· Your pain is felt only in portions of the abdomen, such as the right side or the left lower portion of the abdomen. °· You pass bloody or black tarry stools. °MAKE SURE YOU: °· Understand these instructions. °· Will watch your condition. °· Will get help right away if you are not doing well or get worse. °  °This  information is not intended to replace advice given to you by your health care provider. Make sure you discuss any questions you have with your health care provider. °  °Document Released: 04/05/2005 Document Revised: 03/17/2015 Document Reviewed: 03/05/2013 °Elsevier Interactive Patient Education ©2016 [employer] ° °

## 2015-05-15 NOTE — ED Provider Notes (Signed)
[facility] Emergency Department Provider Note  ____________________________________________  Time seen: Approximately 4:22 AM  I have reviewed the triage vital signs and the nursing notes.   HISTORY  Chief Complaint Chest Pain    HPI Anita Ward is a 81 y.o. female who presents to the ED from home with a chief complaint of abdominal pain and swelling. Patient states she ate a [employer] sandwich approximately 11 AM. Subsequently began to experience epigastric burning sensation which radiated into her chest. Throughout the day she has noted abdominal distention with worsening upper abdominal pain. Symptoms associated with nausea, no vomiting. Last bowel movement was yesterday, normal for patient. Denies recent travel or trauma. Most recently she was treated with Keflex for cellulitis in her groin. Keflex was discontinued and patient given one dose of Diflucan and placed on nystatin cream and powder. States her groin rash has improved. Denies fever, chills, shortness of breath, dysuria. Nothing makes her symptoms better or worse.   Past Medical History  Diagnosis Date  . HTN (hypertension)   . Coronary artery disease   . Glaucoma   . Osteoporosis     Patient Active Problem List   Diagnosis Date Noted  . CHF (congestive heart failure) (HCC)   . UTI (urinary tract infection) 03/12/2015  . UTI (lower urinary tract infection) 03/12/2015    Past Surgical History  Procedure Laterality Date  . Cardiac surgery    . Cardiac surgery     cholecystectomy  Current Outpatient Rx  Name  Route  Sig  Dispense  Refill  . ALPRAZolam (XANAX) 0.25 MG tablet   Oral   Take 0.25 mg by mouth 2 (two) times daily as needed for anxiety.         . Calcium Carbonate-Vitamin D 600-200 MG-UNIT TABS   Oral   Take 1 tablet by mouth 2 (two) times daily.         . cloNIDine (CATAPRES) 0.2 MG tablet   Oral   Take 1 tablet (0.2 mg total) by mouth 2 (two) times daily.  60 tablet   3   . CRANBERRY PO   Oral   Take 1 capsule by mouth daily.         . feeding supplement, ENSURE ENLIVE, (ENSURE ENLIVE) LIQD   Oral   Take 237 mLs by mouth 2 (two) times daily between meals.   237 mL   12   . furosemide (LASIX) 40 MG tablet   Oral   Take 1 tablet (40 mg total) by mouth daily.   30 tablet   5   . isosorbide mononitrate (IMDUR) 30 MG 24 hr tablet   Oral   Take 30 mg by mouth 2 (two) times daily.         Marland Kitchen levothyroxine (SYNTHROID, LEVOTHROID) 25 MCG tablet   Oral   Take 25 mcg by mouth daily before breakfast.         . losartan (COZAAR) 100 MG tablet   Oral   Take 0.5 tablets (50 mg total) by mouth daily.   30 tablet   3   . Magnesium 100 MG TABS   Oral   Take 100 mg by mouth 2 (two) times daily.         . metoprolol succinate (TOPROL-XL) 50 MG 24 hr tablet      1 tab po qd   30 tablet   3   . NIFEdipine (PROCARDIA-XL/ADALAT-CC/NIFEDICAL-XL) 30 MG 24 hr tablet   Oral  Take 30 mg by mouth daily.         . nitroGLYCERIN (NITROSTAT) 0.4 MG SL tablet   Sublingual   Place 0.4 mg under the tongue every 5 (five) minutes as needed for chest pain.         Marland Kitchen nystatin (MYCOSTATIN/NYSTOP) 100000 UNIT/GM POWD      Apply to groin area twice daily for the next 7 days.   30 g   0   . pantoprazole (PROTONIX) 40 MG tablet   Oral   Take 40 mg by mouth daily.         . potassium chloride 20 MEQ TBCR   Oral   Take 10 mEq by mouth daily.   30 tablet   3   . traMADol (ULTRAM) 50 MG tablet   Oral   Take 50 mg by mouth 3 (three) times daily as needed for moderate pain.         . vitamin B-12 (CYANOCOBALAMIN) 1000 MCG tablet   Oral   Take 1,000 mcg by mouth daily.         . cephALEXin (KEFLEX) 500 MG capsule   Oral   Take 1 capsule (500 mg total) by mouth 3 (three) times daily. Patient not taking: Reported on 05/15/2015   21 capsule   0   . levofloxacin (LEVAQUIN) 750 MG tablet   Oral   Take 1 tablet (750 mg total)  by mouth every other day. Patient not taking: Reported on 05/12/2015   5 tablet   0     Allergies Benicar; Cardura; Ciprofloxacin; Fosamax; Lisinopril; Norvasc; Penicillins; Sulfa antibiotics; and Torsemide  Family History  Problem Relation Age of Onset  . Congestive Heart Failure Father   . CAD Brother     Social History Social History  Substance Use Topics  . Smoking status: Never Smoker   . Smokeless tobacco: Not on file  . Alcohol Use: No    Review of Systems Constitutional: No fever/chills Eyes: No visual changes. ENT: No sore throat. Cardiovascular: Positive for chest pain. Respiratory: Denies shortness of breath. Gastrointestinal: Positive for abdominal pain.  Positive for nausea, no vomiting.  No diarrhea.  No constipation. Genitourinary: Negative for dysuria. Musculoskeletal: Negative for back pain. Skin: Negative for rash. Neurological: Negative for headaches, focal weakness or numbness.  10-point ROS otherwise negative.  ____________________________________________   PHYSICAL EXAM:  VITAL SIGNS: ED Triage Vitals  Enc Vitals Group     BP 05/15/15 0349 139/54 mmHg     Pulse Rate 05/15/15 0349 92     Resp 05/15/15 0344 16     Temp 05/15/15 0349 97.5 F (36.4 C)     Temp Source 05/15/15 0344 Oral     SpO2 05/15/15 0349 97 %     Weight 05/15/15 0344 130 lb (58.968 kg)     Height 05/15/15 0344 5' (1.524 m)     Head Cir --      Peak Flow --      Pain Score --      Pain Loc --      Pain Edu? --      Excl. in GC? --     Constitutional: Alert and oriented. Well appearing and in no acute distress. Eyes: Conjunctivae are normal. PERRL. EOMI. Head: Atraumatic. Nose: No congestion/rhinnorhea. Mouth/Throat: Mucous membranes are moist.  Oropharynx non-erythematous. Neck: No stridor.   Cardiovascular: Normal rate, regular rhythm. Grossly normal heart sounds.  Good peripheral circulation. Respiratory: Normal respiratory effort.  No retractions.  Lungs  CTAB. Gastrointestinal: Soft and mildly tender to palpation upper abdomen without rebound or guarding. Moderate distention. Decreased bowel sounds. No abdominal bruits. No CVA tenderness. Musculoskeletal: No lower extremity tenderness nor edema.  No joint effusions. Neurologic:  Normal speech and language. No gross focal neurologic deficits are appreciated. No gait instability. Skin:  Skin is warm, dry and intact. No rash noted. Psychiatric: Mood and affect are normal. Speech and behavior are normal.  ____________________________________________   LABS (all labs ordered are listed, but only abnormal results are displayed)  Labs Reviewed  BASIC METABOLIC PANEL - Abnormal; Notable for the following:    Sodium 130 (*)    Chloride 92 (*)    Glucose, Bld 123 (*)    BUN 22 (*)    Creatinine, Ser 1.53 (*)    GFR calc non Af Amer 28 (*)    GFR calc Af Amer 33 (*)    All other components within normal limits  CBC - Abnormal; Notable for the following:    WBC 12.8 (*)    Hemoglobin 11.7 (*)    MCV 77.6 (*)    MCH 25.1 (*)    RDW 15.5 (*)    All other components within normal limits  HEPATIC FUNCTION PANEL - Abnormal; Notable for the following:    Albumin 3.4 (*)    ALT 12 (*)    Bilirubin, Direct <0.1 (*)    All other components within normal limits  URINALYSIS COMPLETEWITH MICROSCOPIC ([facility] ONLY) - Abnormal; Notable for the following:    Color, Urine YELLOW (*)    APPearance CLEAR (*)    Squamous Epithelial / LPF 6-30 (*)    All other components within normal limits  TROPONIN I  LIPASE, BLOOD   ____________________________________________  EKG  ED ECG REPORT I, Naviah Belfield J, the attending physician, personally viewed and interpreted this ECG.   Date: 05/15/2015  EKG Time: 0346  Rate: 93  Rhythm: normal EKG, normal sinus rhythm  Axis: Normal  Intervals:first-degree A-V block   ST&T Change: Nonspecific  ____________________________________________  RADIOLOGY  Acute  abdominal series (viewed by me, interpreted per Dr. Clovis Riley): Negative for obstruction or perforation. ____________________________________________   PROCEDURES  Procedure(s) performed: None  Critical Care performed: No  ____________________________________________   INITIAL IMPRESSION / ASSESSMENT AND PLAN / ED COURSE  Pertinent labs & imaging results that were available during my care of the patient were reviewed by me and considered in my medical decision making (see chart for details).  81 year old female who presents with upper abdominal pain, nausea with distention. X-rays are negative for obstruction. Will obtain screening lab work including LFTs and lipase as well as troponin. Given degree of distention as well as upper abdominal pain, will obtain CT abdomen/pelvis.   ----------------------------------------- 7:03 AM on 05/15/2015 -----------------------------------------  Patient resting in no acute distress. Awaiting CT imaging results. Care transferred to Dr. Langston Masker pending CT results. ____________________________________________   FINAL CLINICAL IMPRESSION(S) / ED DIAGNOSES  Final diagnoses:  Pain of upper abdomen      Irean Hong, MD 05/15/15 [phone]

## 2015-05-15 NOTE — ED Provider Notes (Addendum)
-----------------------------------------   8:10 AM on 05/15/2015 -----------------------------------------    Patient with epigastric pain and distention over the past day. Sinus of follow-up with the patient's CAT scan. Patient says that she is currently passing gas but has not had a bowel movement over the past 2 days. Upon discussion with the patient as well as her grandson the patient says that she used to take Fleet enemas but is since stopped. She denies any pain at this time. Denies any vomiting at home. Says her last bowel movement was 2 days ago. Physical Exam  BP 128/56 mmHg  Pulse 73  Temp(Src) 97.5 F (36.4 C) (Oral)  Resp 14  Ht 5' (1.524 m)  Wt 130 lb (58.968 kg)  BMI 25.39 kg/m2  SpO2 94%  Physical Exam abdominal distention without tenderness at this time.  ED Course  Procedures  MDM CAT scan showing large stool pertinent with early ileus or obstruction.  I discussed the case with Dr. Excell Seltzer of the surgical service who says that if the patient is not vomiting and not in any pain that she may try with an aggressive bowel regimen including a laxative and Fleet enemas as an outpatient.  I discussed this plan with the patient as well as the grandson. The patient says that she would prefer to go home at this time and try to move her bowels as well as use the enema and laxatives. I discussed the plan with her and the grandson and they understand how to proceed. She'll be given a prescription with MiraLAX and also to try Fleet enemas. We discussed using them in serious if there is not a good result after the first one. The patient and family understand the plan and are willing to comply. They also note a return immediately if any symptoms worsen including pain or distention of the belly.     Myrna Blazer, MD 05/15/15 [phone]  Furthermore, I discussed doing the first doses of the medications here but the patient says she would be more comfortable at home.  Myrna Blazer, MD 05/15/15 [phone]

## 2015-05-15 NOTE — ED Notes (Signed)
Patient transported to CT 

## 2015-05-17 ENCOUNTER — Encounter: Payer: Self-pay | Admitting: Emergency Medicine

## 2015-05-17 ENCOUNTER — Emergency Department
Admission: EM | Admit: 2015-05-17 | Discharge: 2015-05-17 | Disposition: A | Discharge status: Home / Self Care | Payer: Medicare Other | Attending: Emergency Medicine | Admitting: Emergency Medicine

## 2015-05-17 ENCOUNTER — Emergency Department: Payer: Medicare Other

## 2015-05-17 DIAGNOSIS — Z880 Allergy status to penicillin: Secondary | ICD-10-CM | POA: Diagnosis not present

## 2015-05-17 DIAGNOSIS — F419 Anxiety disorder, unspecified: Secondary | ICD-10-CM | POA: Insufficient documentation

## 2015-05-17 DIAGNOSIS — R0602 Shortness of breath: Secondary | ICD-10-CM | POA: Diagnosis present

## 2015-05-17 DIAGNOSIS — R600 Localized edema: Secondary | ICD-10-CM | POA: Insufficient documentation

## 2015-05-17 DIAGNOSIS — I10 Essential (primary) hypertension: Secondary | ICD-10-CM | POA: Diagnosis not present

## 2015-05-17 LAB — CBC WITH DIFFERENTIAL/PLATELET
BASOS ABS: 0.1 10*3/uL (ref 0–0.1)
BASOS PCT: 1 %
EOS ABS: 0.1 10*3/uL (ref 0–0.7)
EOS PCT: 1 %
HCT: 32.6 % — ABNORMAL LOW (ref 35.0–47.0)
Hemoglobin: 11 g/dL — ABNORMAL LOW (ref 12.0–16.0)
Lymphocytes Relative: 17 %
Lymphs Abs: 1.6 10*3/uL (ref 1.0–3.6)
MCH: 25.9 pg — ABNORMAL LOW (ref 26.0–34.0)
MCHC: 33.8 g/dL (ref 32.0–36.0)
MCV: 76.6 fL — ABNORMAL LOW (ref 80.0–100.0)
MONO ABS: 0.6 10*3/uL (ref 0.2–0.9)
Monocytes Relative: 7 %
Neutro Abs: 7.2 10*3/uL — ABNORMAL HIGH (ref 1.4–6.5)
Neutrophils Relative %: 74 %
PLATELETS: 327 10*3/uL (ref 150–440)
RBC: 4.26 MIL/uL (ref 3.80–5.20)
RDW: 15.3 % — AB (ref 11.5–14.5)
WBC: 9.6 10*3/uL (ref 3.6–11.0)

## 2015-05-17 LAB — BASIC METABOLIC PANEL
ANION GAP: 7 (ref 5–15)
BUN: 20 mg/dL (ref 6–20)
CALCIUM: 9 mg/dL (ref 8.9–10.3)
CO2: 30 mmol/L (ref 22–32)
Chloride: 92 mmol/L — ABNORMAL LOW (ref 101–111)
Creatinine, Ser: 1.3 mg/dL — ABNORMAL HIGH (ref 0.44–1.00)
GFR, EST AFRICAN AMERICAN: 40 mL/min — AB (ref >60)
GFR, EST NON AFRICAN AMERICAN: 34 mL/min — AB (ref >60)
GLUCOSE: 116 mg/dL — AB (ref 65–99)
Potassium: 3.9 mmol/L (ref 3.5–5.1)
SODIUM: 129 mmol/L — AB (ref 135–145)

## 2015-05-17 LAB — BRAIN NATRIURETIC PEPTIDE: B NATRIURETIC PEPTIDE 5: 171 pg/mL — AB (ref 0.0–100.0)

## 2015-05-17 LAB — TROPONIN I

## 2015-05-17 MED ORDER — ALPRAZOLAM 0.5 MG PO TABS: 0.5000 mg | ORAL_TABLET | Freq: Three times a day (TID) | ORAL | Status: DC | PRN

## 2015-05-17 MED ORDER — LORAZEPAM 2 MG/ML IJ SOLN
0.5000 mg | Freq: Once | INTRAMUSCULAR | Status: AC
  Administered 2015-05-17: 0.5 mg via INTRAVENOUS
  Filled 2015-05-17: qty 1

## 2015-05-17 NOTE — Discharge Instructions (Signed)
Panic Attacks °Panic attacks are sudden, short-lived surges of severe anxiety, fear, or discomfort. They may occur for no reason when you are relaxed, when you are anxious, or when you are sleeping. Panic attacks may occur for a number of reasons:  °· Healthy people occasionally have panic attacks in extreme, life-threatening situations, such as war or natural disasters. Normal anxiety is a protective mechanism of the body that helps us react to danger (fight or flight response). °· Panic attacks are often seen with anxiety disorders, such as panic disorder, social anxiety disorder, generalized anxiety disorder, and phobias. Anxiety disorders cause excessive or uncontrollable anxiety. They may interfere with your relationships or other life activities. °· Panic attacks are sometimes seen with other mental illnesses, such as depression and posttraumatic stress disorder. °· Certain medical conditions, prescription medicines, and drugs of abuse can cause panic attacks. °SYMPTOMS  °Panic attacks start suddenly, peak within 20 minutes, and are accompanied by four or more of the following symptoms: °· Pounding heart or fast heart rate (palpitations). °· Sweating. °· Trembling or shaking. °· Shortness of breath or feeling smothered. °· Feeling choked. °· Chest pain or discomfort. °· Nausea or strange feeling in your stomach. °· Dizziness, light-headedness, or feeling like you will faint. °· Chills or hot flushes. °· Numbness or tingling in your lips or hands and feet. °· Feeling that things are not real or feeling that you are not yourself. °· Fear of losing control or going crazy. °· Fear of dying. °Some of these symptoms can mimic serious medical conditions. For example, you may think you are having a heart attack. Although panic attacks can be very scary, they are not life threatening. °DIAGNOSIS  °Panic attacks are diagnosed through an assessment by your health care provider. Your health care provider will ask  questions about your symptoms, such as where and when they occurred. Your health care provider will also ask about your medical history and use of alcohol and drugs, including prescription medicines. Your health care provider may order blood tests or other studies to rule out a serious medical condition. Your health care provider may refer you to a mental health professional for further evaluation. °TREATMENT  °· Most healthy people who have one or two panic attacks in an extreme, life-threatening situation will not require treatment. °· The treatment for panic attacks associated with anxiety disorders or other mental illness typically involves counseling with a mental health professional, medicine, or a combination of both. Your health care provider will help determine what treatment is best for you. °· Panic attacks due to physical illness usually go away with treatment of the illness. If prescription medicine is causing panic attacks, talk with your health care provider about stopping the medicine, decreasing the dose, or substituting another medicine. °· Panic attacks due to alcohol or drug abuse go away with abstinence. Some adults need professional help in order to stop drinking or using drugs. °HOME CARE INSTRUCTIONS  °· Take all medicines as directed by your health care provider.   °· Schedule and attend follow-up visits as directed by your health care provider. It is important to keep all your appointments. °SEEK MEDICAL CARE IF: °· You are not able to take your medicines as prescribed. °· Your symptoms do not improve or get worse. °SEEK IMMEDIATE MEDICAL CARE IF:  °· You experience panic attack symptoms that are different than your usual symptoms. °· You have serious thoughts about hurting yourself or others. °· You are taking medicine for panic attacks and   have a serious side effect. MAKE SURE YOU:  Understand these instructions.  Will watch your condition.  Will get help right away if you are not  doing well or get worse.   This information is not intended to replace advice given to you by your health care provider. Make sure you discuss any questions you have with your health care provider.   Document Released: 06/26/2005 Document Revised: 07/01/2013 Document Reviewed: 02/07/2013 Elsevier Interactive Patient Education 2016 [employer]  Edema Edema is an abnormal buildup of fluids in your bodytissues. Edema is somewhatdependent on gravity to pull the fluid to the lowest place in your body. That makes the condition more common in the legs and thighs (lower extremities). Painless swelling of the feet and ankles is common and becomes more likely as you get older. It is also common in looser tissues, like around your eyes.  When the affected area is squeezed, the fluid may move out of that spot and leave a dent for a few moments. This dent is called pitting.  CAUSES  There are many possible causes of edema. Eating too much salt and being on your feet or sitting for a long time can cause edema in your legs and ankles. Hot weather may make edema worse. Common medical causes of edema include:  Heart failure.  Liver disease.  Kidney disease.  Weak blood vessels in your legs.  Cancer.  An injury.  Pregnancy.  Some medications.  Obesity. SYMPTOMS  Edema is usually painless.Your skin may look swollen or shiny.  DIAGNOSIS  Your health care provider may be able to diagnose edema by asking about your medical history and doing a physical exam. You may need to have tests such as X-rays, an electrocardiogram, or blood tests to check for medical conditions that may cause edema.  TREATMENT  Edema treatment depends on the cause. If you have heart, liver, or kidney disease, you need the treatment appropriate for these conditions. General treatment may include:  Elevation of the affected body part above the level of your heart.  Compression of the affected body part. Pressure from  elastic bandages or support stockings squeezes the tissues and forces fluid back into the blood vessels. This keeps fluid from entering the tissues.  Restriction of fluid and salt intake.  Use of a water pill (diuretic). These medications are appropriate only for some types of edema. They pull fluid out of your body and make you urinate more often. This gets rid of fluid and reduces swelling, but diuretics can have side effects. Only use diuretics as directed by your health care provider. HOME CARE INSTRUCTIONS   Keep the affected body part above the level of your heart when you are lying down.   Do not sit still or stand for prolonged periods.   Do not put anything directly under your knees when lying down.  Do not wear constricting clothing or garters on your upper legs.   Exercise your legs to work the fluid back into your blood vessels. This may help the swelling go down.   Wear elastic bandages or support stockings to reduce ankle swelling as directed by your health care provider.   Eat a low-salt diet to reduce fluid if your health care provider recommends it.   Only take medicines as directed by your health care provider. SEEK MEDICAL CARE IF:   Your edema is not responding to treatment.  You have heart, liver, or kidney disease and notice symptoms of edema.  You  have edema in your legs that does not improve after elevating them.   You have sudden and unexplained weight gain. SEEK IMMEDIATE MEDICAL CARE IF:   You develop shortness of breath or chest pain.   You cannot breathe when you lie down.  You develop pain, redness, or warmth in the swollen areas.   You have heart, liver, or kidney disease and suddenly get edema.  You have a fever and your symptoms suddenly get worse. MAKE SURE YOU:   Understand these instructions.  Will watch your condition.  Will get help right away if you are not doing well or get worse.   This information is not intended to  replace advice given to you by your health care provider. Make sure you discuss any questions you have with your health care provider.   Document Released: 06/26/2005 Document Revised: 07/17/2014 Document Reviewed: 04/18/2013 Elsevier Interactive Patient Education [employer]

## 2015-05-17 NOTE — ED Notes (Signed)
Pt to ed with c/o leg swelling x several days,  Noted edema in feet and ankles. Also reports pain in epigastric region and sob.

## 2015-05-17 NOTE — ED Provider Notes (Signed)
[facility] Emergency Department Provider Note     Time seen: ----------------------------------------- 11:06 AM on 05/17/2015 -----------------------------------------    I have reviewed the triage vital signs and the nursing notes.   HISTORY  Chief Complaint Leg Swelling and Shortness of Breath    HPI Anita Ward is a 81 y.o. female who presents to ER for leg swelling for several days. She did have some edema noted in triage and her feet and ankles, reports pain in the epigastrium and shortness of breath. Patient recently had a doctor's point with her primary care doctor but she thought she couldn't wait due to severe symptoms.   Past Medical History  Diagnosis Date  . HTN (hypertension)   . Coronary artery disease   . Glaucoma   . Osteoporosis     Patient Active Problem List   Diagnosis Date Noted  . CHF (congestive heart failure) (HCC)   . UTI (urinary tract infection) 03/12/2015  . UTI (lower urinary tract infection) 03/12/2015    Past Surgical History  Procedure Laterality Date  . Cardiac surgery    . Cardiac surgery    . Coronary artery bypass graft      Allergies Benicar; Cardura; Ciprofloxacin; Fosamax; Lisinopril; Norvasc; Penicillins; Sulfa antibiotics; and Torsemide  Social History Social History  Substance Use Topics  . Smoking status: Never Smoker   . Smokeless tobacco: None  . Alcohol Use: No    Review of Systems Constitutional: Negative for fever. Eyes: Negative for visual changes. ENT: Negative for sore throat. Cardiovascular: Negative for chest pain. Respiratory: Positive for shortness of breath Gastrointestinal: Negative for abdominal pain, vomiting and diarrhea. Genitourinary: Negative for dysuria. Musculoskeletal: Negative for back pain. Positive for bilateral leg swelling Skin: Negative for rash. Neurological: Negative for headaches, focal weakness or numbness. Psychiatric: Positive for  anxiety  10-point ROS otherwise negative.  ____________________________________________   PHYSICAL EXAM:  VITAL SIGNS: ED Triage Vitals  Enc Vitals Group     BP 05/17/15 1042 151/60 mmHg     Pulse Rate 05/17/15 1042 84     Resp 05/17/15 1042 20     Temp 05/17/15 1042 97.6 F (36.4 C)     Temp Source 05/17/15 1042 Oral     SpO2 05/17/15 1042 100 %     Weight 05/17/15 1042 130 lb (58.968 kg)     Height --      Head Cir --      Peak Flow --      Pain Score 05/17/15 1043 4     Pain Loc --      Pain Edu? --      Excl. in GC? --     Constitutional: Alert and oriented. Well appearing and in no distress. Patient appears anxious Eyes: Conjunctivae are normal. PERRL. Normal extraocular movements. ENT   Head: Normocephalic and atraumatic.   Nose: No congestion/rhinnorhea.   Mouth/Throat: Mucous membranes are moist.   Neck: No stridor. Cardiovascular: Normal rate, regular rhythm. Normal and symmetric distal pulses are present in all extremities. No murmurs, rubs, or gallops. Respiratory: Normal respiratory effort without tachypnea nor retractions. Breath sounds are clear and equal bilaterally. No wheezes/rales/rhonchi. Gastrointestinal: Soft and nontender. No distention. No abdominal bruits.  Musculoskeletal: Nontender with normal range of motion in all extremities. No joint effusions.  No lower extremity tenderness, mild edema to the ankles bilaterally Neurologic:  Normal speech and language. No gross focal neurologic deficits are appreciated. Speech is normal. No gait instability. Skin:  Skin is  warm, dry and intact. No rash noted. Psychiatric: Patient is very anxious, no acute distress. ____________________________________________  EKG: Interpreted by me. Normal sinus rhythm with first degree AV block, rate is 3 bpm, normal QS with, normal QT interval. Lateral T wave inversions  ____________________________________________  ED COURSE:  Pertinent labs & imaging  results that were available during my care of the patient were reviewed by me and considered in my medical decision making (see chart for details). We'll check basic labs, patient will receive IV anxiolytics and reevaluation ____________________________________________    LABS (pertinent positives/negatives)  Labs Reviewed  CBC WITH DIFFERENTIAL/PLATELET - Abnormal; Notable for the following:    Hemoglobin 11.0 (*)    HCT 32.6 (*)    MCV 76.6 (*)    MCH 25.9 (*)    RDW 15.3 (*)    Neutro Abs 7.2 (*)    All other components within normal limits  BASIC METABOLIC PANEL - Abnormal; Notable for the following:    Sodium 129 (*)    Chloride 92 (*)    Glucose, Bld 116 (*)    Creatinine, Ser 1.30 (*)    GFR calc non Af Amer 34 (*)    GFR calc Af Amer 40 (*)    All other components within normal limits  BRAIN NATRIURETIC PEPTIDE - Abnormal; Notable for the following:    B Natriuretic Peptide 171.0 (*)    All other components within normal limits  TROPONIN I    RADIOLOGY Images were viewed by me  Chest x-ray IMPRESSION: No edema or consolidation. ____________________________________________  FINAL ASSESSMENT AND PLAN  Edema, anxiety  Plan: Patient with labs and imaging as dictated above. Patient has been advised that she is dealing with anxiety. Her labs are stable, or edema is stable. She is advised to follow-up with her doctor tomorrow as scheduled not change her medications of an increasing her anxiety medication.   Emily Filbert, MD   Emily Filbert, MD 05/17/15 [id number]

## 2015-05-26 ENCOUNTER — Emergency Department: Payer: Medicare Other

## 2015-05-26 ENCOUNTER — Inpatient Hospital Stay
Admission: EM | Admit: 2015-05-26 | Discharge: 2015-05-29 | DRG: 281 | Disposition: A | Discharge status: Home / Self Care | Payer: Medicare Other | Attending: Internal Medicine | Admitting: Internal Medicine

## 2015-05-26 ENCOUNTER — Encounter: Payer: Self-pay | Admitting: Emergency Medicine

## 2015-05-26 DIAGNOSIS — N390 Urinary tract infection, site not specified: Secondary | ICD-10-CM | POA: Diagnosis present

## 2015-05-26 DIAGNOSIS — H409 Unspecified glaucoma: Secondary | ICD-10-CM | POA: Diagnosis present

## 2015-05-26 DIAGNOSIS — Z7982 Long term (current) use of aspirin: Secondary | ICD-10-CM

## 2015-05-26 DIAGNOSIS — I25119 Atherosclerotic heart disease of native coronary artery with unspecified angina pectoris: Secondary | ICD-10-CM | POA: Diagnosis present

## 2015-05-26 DIAGNOSIS — Z888 Allergy status to other drugs, medicaments and biological substances status: Secondary | ICD-10-CM

## 2015-05-26 DIAGNOSIS — R079 Chest pain, unspecified: Secondary | ICD-10-CM | POA: Diagnosis not present

## 2015-05-26 DIAGNOSIS — I447 Left bundle-branch block, unspecified: Secondary | ICD-10-CM | POA: Diagnosis present

## 2015-05-26 DIAGNOSIS — Z951 Presence of aortocoronary bypass graft: Secondary | ICD-10-CM

## 2015-05-26 DIAGNOSIS — F419 Anxiety disorder, unspecified: Secondary | ICD-10-CM | POA: Diagnosis present

## 2015-05-26 DIAGNOSIS — Z7902 Long term (current) use of antithrombotics/antiplatelets: Secondary | ICD-10-CM

## 2015-05-26 DIAGNOSIS — F329 Major depressive disorder, single episode, unspecified: Secondary | ICD-10-CM | POA: Diagnosis present

## 2015-05-26 DIAGNOSIS — Z79899 Other long term (current) drug therapy: Secondary | ICD-10-CM

## 2015-05-26 DIAGNOSIS — I214 Non-ST elevation (NSTEMI) myocardial infarction: Principal | ICD-10-CM | POA: Diagnosis present

## 2015-05-26 DIAGNOSIS — E039 Hypothyroidism, unspecified: Secondary | ICD-10-CM | POA: Diagnosis present

## 2015-05-26 DIAGNOSIS — I219 Acute myocardial infarction, unspecified: Secondary | ICD-10-CM | POA: Diagnosis present

## 2015-05-26 DIAGNOSIS — Z880 Allergy status to penicillin: Secondary | ICD-10-CM

## 2015-05-26 DIAGNOSIS — E785 Hyperlipidemia, unspecified: Secondary | ICD-10-CM | POA: Diagnosis present

## 2015-05-26 DIAGNOSIS — I10 Essential (primary) hypertension: Secondary | ICD-10-CM | POA: Diagnosis present

## 2015-05-26 DIAGNOSIS — R112 Nausea with vomiting, unspecified: Secondary | ICD-10-CM | POA: Diagnosis present

## 2015-05-26 DIAGNOSIS — Z883 Allergy status to other anti-infective agents status: Secondary | ICD-10-CM

## 2015-05-26 DIAGNOSIS — E871 Hypo-osmolality and hyponatremia: Secondary | ICD-10-CM | POA: Diagnosis present

## 2015-05-26 DIAGNOSIS — M810 Age-related osteoporosis without current pathological fracture: Secondary | ICD-10-CM | POA: Diagnosis present

## 2015-05-26 DIAGNOSIS — Z881 Allergy status to other antibiotic agents status: Secondary | ICD-10-CM

## 2015-05-26 DIAGNOSIS — Z882 Allergy status to sulfonamides status: Secondary | ICD-10-CM

## 2015-05-26 LAB — COMPREHENSIVE METABOLIC PANEL
ALBUMIN: 3.4 g/dL — AB (ref 3.5–5.0)
ALK PHOS: 56 U/L (ref 38–126)
ALT: 10 U/L — ABNORMAL LOW (ref 14–54)
ANION GAP: 6 (ref 5–15)
AST: 22 U/L (ref 15–41)
BILIRUBIN TOTAL: 0.6 mg/dL (ref 0.3–1.2)
BUN: 10 mg/dL (ref 6–20)
CO2: 27 mmol/L (ref 22–32)
Calcium: 8.7 mg/dL — ABNORMAL LOW (ref 8.9–10.3)
Chloride: 101 mmol/L (ref 101–111)
Creatinine, Ser: 0.92 mg/dL (ref 0.44–1.00)
GFR calc Af Amer: >60 mL/min (ref >60)
GFR, EST NON AFRICAN AMERICAN: 52 mL/min — AB (ref >60)
GLUCOSE: 115 mg/dL — AB (ref 65–99)
POTASSIUM: 3.3 mmol/L — AB (ref 3.5–5.1)
Sodium: 134 mmol/L — ABNORMAL LOW (ref 135–145)
Total Protein: 7.3 g/dL (ref 6.5–8.1)

## 2015-05-26 LAB — URINALYSIS COMPLETE WITH MICROSCOPIC (ARMC ONLY)
Bacteria, UA: NONE SEEN
Bilirubin Urine: NEGATIVE
Glucose, UA: NEGATIVE mg/dL
HGB URINE DIPSTICK: NEGATIVE
Ketones, ur: NEGATIVE mg/dL
NITRITE: NEGATIVE
PROTEIN: 30 mg/dL — AB
SPECIFIC GRAVITY, URINE: 1.003 — AB (ref 1.005–1.030)
pH: 8 (ref 5.0–8.0)

## 2015-05-26 LAB — TROPONIN I: Troponin I: 0.04 ng/mL — ABNORMAL HIGH (ref <0.031)

## 2015-05-26 LAB — CBC
HEMATOCRIT: 31.9 % — AB (ref 35.0–47.0)
HEMOGLOBIN: 10.9 g/dL — AB (ref 12.0–16.0)
MCH: 26.6 pg (ref 26.0–34.0)
MCHC: 34.3 g/dL (ref 32.0–36.0)
MCV: 77.7 fL — AB (ref 80.0–100.0)
Platelets: 333 10*3/uL (ref 150–440)
RBC: 4.11 MIL/uL (ref 3.80–5.20)
RDW: 15.5 % — AB (ref 11.5–14.5)
WBC: 11.8 10*3/uL — ABNORMAL HIGH (ref 3.6–11.0)

## 2015-05-26 LAB — BRAIN NATRIURETIC PEPTIDE: B Natriuretic Peptide: 660 pg/mL — ABNORMAL HIGH (ref 0.0–100.0)

## 2015-05-26 NOTE — ED Notes (Signed)
Lab called with alert value. Pts trop was 0.04. Provider made aware.

## 2015-05-26 NOTE — ED Notes (Signed)
Pt here from home via ems pt states she felt like her bp was getting high so she took her own BP 200/99, pt states she was also having chest pressure feeling like something was sitting on her chest as well as head pressure. Pt states she pressed her medical alert button to have ems come see her. Ems states when they got there they felt she had st elevation. Gave pt 324mg  aspirin did not get nitro. Place 2 IVs.  Pt also c/o abd distention that has been going on since about sep she has had bad diarrhea and loose stools, upon assessing pt abd is distended, pt states she does have pain  Pt a&ox4

## 2015-05-26 NOTE — ED Provider Notes (Signed)
[facility] Emergency Department Provider Note   ____________________________________________  Time seen: 2225  I have reviewed the triage vital signs and the nursing notes.   HISTORY  Chief Complaint Chest Pain   History limited by: Not Limited   HPI Anita Ward is a 81 y.o. female who presents to the emergency department today via EMS because of concerns for chest pressure and high blood pressure. Patient states she was at home when she started feeling some chest pressure. She states she also had some head pressure. The chest pressure was located in the central chest. She states that she checked her blood pressure and it was elevated 200/99. At the time of my evaluation she states she feels much better. She states that she has not felt like her blood pressure is been that high before in the past. She denies any recent fever.  She has had some diarrhea for which she states she has been worked up.   Past Medical History  Diagnosis Date  . HTN (hypertension)   . Coronary artery disease   . Glaucoma   . Osteoporosis     Patient Active Problem List   Diagnosis Date Noted  . CHF (congestive heart failure) (HCC)   . UTI (urinary tract infection) 03/12/2015  . UTI (lower urinary tract infection) 03/12/2015    Past Surgical History  Procedure Laterality Date  . Cardiac surgery    . Cardiac surgery    . Coronary artery bypass graft      Current Outpatient Rx  Name  Route  Sig  Dispense  Refill  . ALPRAZolam (XANAX) 0.5 MG tablet   Oral   Take 1 tablet (0.5 mg total) by mouth 3 (three) times daily as needed for sleep or anxiety.   60 tablet   0   . Calcium Carbonate-Vitamin D 600-200 MG-UNIT TABS   Oral   Take 1 tablet by mouth 2 (two) times daily.         . cloNIDine (CATAPRES) 0.3 MG tablet   Oral   Take 0.3 mg by mouth.         . CRANBERRY PO   Oral   Take 1 capsule by mouth daily.         . feeding supplement, ENSURE  ENLIVE, (ENSURE ENLIVE) LIQD   Oral   Take 237 mLs by mouth 2 (two) times daily between meals.   237 mL   12   . furosemide (LASIX) 40 MG tablet   Oral   Take 1 tablet (40 mg total) by mouth daily. Patient taking differently: Take 20 mg by mouth 2 (two) times daily.    30 tablet   5   . isosorbide mononitrate (IMDUR) 30 MG 24 hr tablet   Oral   Take 30 mg by mouth 2 (two) times daily.         Marland Kitchen levothyroxine (SYNTHROID, LEVOTHROID) 25 MCG tablet   Oral   Take 25 mcg by mouth daily before breakfast.         . losartan (COZAAR) 100 MG tablet   Oral   Take 0.5 tablets (50 mg total) by mouth daily.   30 tablet   3   . Magnesium 100 MG TABS   Oral   Take 100 mg by mouth 2 (two) times daily.         . metoprolol succinate (TOPROL-XL) 50 MG 24 hr tablet      1 tab po qd   30  tablet   3   . NIFEdipine (PROCARDIA-XL/ADALAT-CC/NIFEDICAL-XL) 30 MG 24 hr tablet   Oral   Take 30 mg by mouth daily.         . nitroGLYCERIN (NITROSTAT) 0.4 MG SL tablet   Sublingual   Place 0.4 mg under the tongue every 5 (five) minutes as needed for chest pain.         . potassium chloride 20 MEQ TBCR   Oral   Take 10 mEq by mouth daily.   30 tablet   3   . vitamin B-12 (CYANOCOBALAMIN) 1000 MCG tablet   Oral   Take 1,000 mcg by mouth daily.           Allergies Benicar; Cardura; Ciprofloxacin; Fosamax; Lisinopril; Norvasc; Penicillins; Sulfa antibiotics; and Torsemide  Family History  Problem Relation Age of Onset  . Congestive Heart Failure Father   . CAD Brother     Social History Social History  Substance Use Topics  . Smoking status: Never Smoker   . Smokeless tobacco: None  . Alcohol Use: No    Review of Systems  Constitutional: Negative for fever. Cardiovascular: Positive for chest pressure. Respiratory: Negative for shortness of breath. Gastrointestinal: Negative for abdominal pain, vomiting and diarrhea. Genitourinary: Negative for  dysuria. Musculoskeletal: Negative for back pain. Skin: Negative for rash. Neurological: Negative for headaches, focal weakness or numbness.   10-point ROS otherwise negative.  ____________________________________________   PHYSICAL EXAM:  VITAL SIGNS: ED Triage Vitals  Enc Vitals Group     BP 05/26/15 2201 138/73 mmHg     Pulse Rate 05/26/15 2201 75     Resp 05/26/15 2201 16     Temp 05/26/15 2201 98.3 F (36.8 C)     Temp Source 05/26/15 2201 Oral     SpO2 05/26/15 2140 99 %     Weight 05/26/15 2201 130 lb (58.968 kg)     Height 05/26/15 2201  (1.575 m)     Head Cir --      Peak Flow --      Pain Score 05/26/15 2203 8   Constitutional: Alert and oriented. Well appearing and in no distress. Eyes: Conjunctivae are normal. PERRL. Normal extraocular movements. ENT   Head: Normocephalic and atraumatic.   Nose: No congestion/rhinnorhea.   Mouth/Throat: Mucous membranes are moist.   Neck: No stridor. Hematological/Lymphatic/Immunilogical: No cervical lymphadenopathy. Cardiovascular: Normal rate, regular rhythm.  No murmurs, rubs, or gallops. Respiratory: Normal respiratory effort without tachypnea nor retractions. Breath sounds are clear and equal bilaterally. No wheezes/rales/rhonchi. Gastrointestinal: Soft and nontender. No distention.  Genitourinary: Deferred Musculoskeletal: Normal range of motion in all extremities. No joint effusions.  No lower extremity tenderness nor edema. Neurologic:  Normal speech and language. No gross focal neurologic deficits are appreciated.  Skin:  Skin is warm, dry and intact. No rash noted. Psychiatric: Mood and affect are normal. Speech and behavior are normal. Patient exhibits appropriate insight and judgment.  ____________________________________________    LABS (pertinent positives/negatives)  Labs Reviewed  CBC - Abnormal; Notable for the following:    WBC 11.8 (*)    Hemoglobin 10.9 (*)    HCT 31.9 (*)    MCV  77.7 (*)    RDW 15.5 (*)    All other components within normal limits  COMPREHENSIVE METABOLIC PANEL - Abnormal; Notable for the following:    Sodium 134 (*)    Potassium 3.3 (*)    Glucose, Bld 115 (*)    Calcium 8.7 (*)  Albumin 3.4 (*)    ALT 10 (*)    GFR calc non Af Amer 52 (*)    All other components within normal limits  BRAIN NATRIURETIC PEPTIDE - Abnormal; Notable for the following:    B Natriuretic Peptide 660.0 (*)    All other components within normal limits  TROPONIN I - Abnormal; Notable for the following:    Troponin I 0.04 (*)    All other components within normal limits  URINALYSIS COMPLETEWITH MICROSCOPIC ([facility] ONLY) - Abnormal; Notable for the following:    Color, Urine COLORLESS (*)    APPearance CLEAR (*)    Specific Gravity, Urine 1.003 (*)    Protein, ur 30 (*)    Leukocytes, UA TRACE (*)    Squamous Epithelial / LPF 0-5 (*)    All other components within normal limits     ____________________________________________   EKG  I, Phineas Semen, attending physician, personally viewed and interpreted this EKG  EKG Time: 2133 Rate: 75 Rhythm: normal sinus rhythm Axis: left axis deviation Intervals: qtc 524 QRS: LBBB ST changes: no st elevation equivalent Impression: abnormal ekg  ____________________________________________    RADIOLOGY  CXR IMPRESSION: 1. Cardiomegaly and chronic bibasilar interstitial changes. No acute disease.   ____________________________________________   PROCEDURES  Procedure(s) performed: None  Critical Care performed: No  ____________________________________________   INITIAL IMPRESSION / ASSESSMENT AND PLAN / ED COURSE  Pertinent labs & imaging results that were available during my care of the patient were reviewed by me and considered in my medical decision making (see chart for details).  Patient presented to the emergency department today after an episode of high blood pressure and chest pain.  Patient does have a history of coronary artery disease. EKG shows left bundle branch block however no ST elevation equivalents. Patient was pain-free at the time my exam. Blood work does show a mild elevation of the troponin. There is also elevation of BNP. Given history and lab findings will plan on admission.  ____________________________________________   FINAL CLINICAL IMPRESSION(S) / ED DIAGNOSES  Final diagnoses:  Chest pain, unspecified chest pain type     Phineas Semen, MD 05/26/15 2321

## 2015-05-27 DIAGNOSIS — I214 Non-ST elevation (NSTEMI) myocardial infarction: Secondary | ICD-10-CM | POA: Diagnosis present

## 2015-05-27 DIAGNOSIS — Z888 Allergy status to other drugs, medicaments and biological substances status: Secondary | ICD-10-CM | POA: Diagnosis not present

## 2015-05-27 DIAGNOSIS — F419 Anxiety disorder, unspecified: Secondary | ICD-10-CM | POA: Diagnosis present

## 2015-05-27 DIAGNOSIS — I447 Left bundle-branch block, unspecified: Secondary | ICD-10-CM | POA: Diagnosis present

## 2015-05-27 DIAGNOSIS — M810 Age-related osteoporosis without current pathological fracture: Secondary | ICD-10-CM | POA: Diagnosis present

## 2015-05-27 DIAGNOSIS — E871 Hypo-osmolality and hyponatremia: Secondary | ICD-10-CM | POA: Diagnosis present

## 2015-05-27 DIAGNOSIS — N390 Urinary tract infection, site not specified: Secondary | ICD-10-CM | POA: Diagnosis present

## 2015-05-27 DIAGNOSIS — F329 Major depressive disorder, single episode, unspecified: Secondary | ICD-10-CM | POA: Diagnosis present

## 2015-05-27 DIAGNOSIS — R079 Chest pain, unspecified: Secondary | ICD-10-CM | POA: Diagnosis present

## 2015-05-27 DIAGNOSIS — Z883 Allergy status to other anti-infective agents status: Secondary | ICD-10-CM | POA: Diagnosis not present

## 2015-05-27 DIAGNOSIS — E039 Hypothyroidism, unspecified: Secondary | ICD-10-CM | POA: Diagnosis present

## 2015-05-27 DIAGNOSIS — Z79899 Other long term (current) drug therapy: Secondary | ICD-10-CM | POA: Diagnosis not present

## 2015-05-27 DIAGNOSIS — Z880 Allergy status to penicillin: Secondary | ICD-10-CM | POA: Diagnosis not present

## 2015-05-27 DIAGNOSIS — R112 Nausea with vomiting, unspecified: Secondary | ICD-10-CM | POA: Diagnosis present

## 2015-05-27 DIAGNOSIS — Z7902 Long term (current) use of antithrombotics/antiplatelets: Secondary | ICD-10-CM | POA: Diagnosis not present

## 2015-05-27 DIAGNOSIS — E785 Hyperlipidemia, unspecified: Secondary | ICD-10-CM | POA: Diagnosis present

## 2015-05-27 DIAGNOSIS — I25119 Atherosclerotic heart disease of native coronary artery with unspecified angina pectoris: Secondary | ICD-10-CM | POA: Diagnosis present

## 2015-05-27 DIAGNOSIS — Z881 Allergy status to other antibiotic agents status: Secondary | ICD-10-CM | POA: Diagnosis not present

## 2015-05-27 DIAGNOSIS — H409 Unspecified glaucoma: Secondary | ICD-10-CM | POA: Diagnosis present

## 2015-05-27 DIAGNOSIS — Z7982 Long term (current) use of aspirin: Secondary | ICD-10-CM | POA: Diagnosis not present

## 2015-05-27 DIAGNOSIS — I10 Essential (primary) hypertension: Secondary | ICD-10-CM | POA: Diagnosis present

## 2015-05-27 DIAGNOSIS — Z882 Allergy status to sulfonamides status: Secondary | ICD-10-CM | POA: Diagnosis not present

## 2015-05-27 DIAGNOSIS — I219 Acute myocardial infarction, unspecified: Secondary | ICD-10-CM | POA: Diagnosis present

## 2015-05-27 DIAGNOSIS — Z951 Presence of aortocoronary bypass graft: Secondary | ICD-10-CM | POA: Diagnosis not present

## 2015-05-27 LAB — CBC
HCT: 33.9 % — ABNORMAL LOW (ref 35.0–47.0)
Hemoglobin: 11.2 g/dL — ABNORMAL LOW (ref 12.0–16.0)
MCH: 25.7 pg — ABNORMAL LOW (ref 26.0–34.0)
MCHC: 33 g/dL (ref 32.0–36.0)
MCV: 77.7 fL — ABNORMAL LOW (ref 80.0–100.0)
PLATELETS: 337 10*3/uL (ref 150–440)
RBC: 4.36 MIL/uL (ref 3.80–5.20)
RDW: 15.5 % — ABNORMAL HIGH (ref 11.5–14.5)
WBC: 10 10*3/uL (ref 3.6–11.0)

## 2015-05-27 LAB — TROPONIN I
Troponin I: 0.16 ng/mL — ABNORMAL HIGH (ref <0.031)
Troponin I: 0.14 ng/mL — ABNORMAL HIGH (ref <0.031)
Troponin I: 0.11 ng/mL — ABNORMAL HIGH (ref <0.031)

## 2015-05-27 LAB — HEPARIN LEVEL (UNFRACTIONATED): Heparin Unfractionated: 0.36 IU/mL (ref 0.30–0.70)

## 2015-05-27 LAB — PROTIME-INR
INR: 1.13
Prothrombin Time: 14.7 seconds (ref 11.4–15.0)

## 2015-05-27 LAB — LIPID PANEL
Cholesterol: 129 mg/dL (ref 0–200)
HDL: 34 mg/dL — ABNORMAL LOW (ref >40)
LDL Cholesterol: 80 mg/dL (ref 0–99)
Total CHOL/HDL Ratio: 3.8 RATIO
Triglycerides: 73 mg/dL (ref <150)
VLDL: 15 mg/dL (ref 0–40)

## 2015-05-27 LAB — HEMOGLOBIN A1C: Hgb A1c MFr Bld: 5.2 % (ref 4.0–6.0)

## 2015-05-27 LAB — APTT: aPTT: 29 seconds (ref 24–36)

## 2015-05-27 LAB — TSH: TSH: 6.908 u[IU]/mL — ABNORMAL HIGH (ref 0.350–4.500)

## 2015-05-27 MED ORDER — HYDRALAZINE HCL 20 MG/ML IJ SOLN
10.0000 mg | Freq: Once | INTRAMUSCULAR | Status: AC
  Administered 2015-05-27: 10 mg via INTRAVENOUS
  Filled 2015-05-27: qty 1

## 2015-05-27 MED ORDER — ASPIRIN EC 81 MG PO TBEC
81.0000 mg | DELAYED_RELEASE_TABLET | Freq: Every day | ORAL | Status: DC
  Administered 2015-05-28 – 2015-05-29 (×2): 81 mg via ORAL
  Filled 2015-05-27 (×2): qty 1

## 2015-05-27 MED ORDER — PNEUMOCOCCAL VAC POLYVALENT 25 MCG/0.5ML IJ INJ: 0.5000 mL | INJECTION | INTRAMUSCULAR | Status: DC

## 2015-05-27 MED ORDER — ASPIRIN 81 MG PO CHEW: 324.0000 mg | CHEWABLE_TABLET | ORAL | Status: AC

## 2015-05-27 MED ORDER — LEVOTHYROXINE SODIUM 75 MCG PO TABS
37.5000 ug | ORAL_TABLET | Freq: Every day | ORAL | Status: DC
  Administered 2015-05-28 – 2015-05-29 (×2): 37.5 ug via ORAL
  Filled 2015-05-27 (×2): qty 0.5
  Filled 2015-05-27: qty 1

## 2015-05-27 MED ORDER — ONDANSETRON HCL 4 MG/2ML IJ SOLN
4.0000 mg | Freq: Four times a day (QID) | INTRAMUSCULAR | Status: DC | PRN
  Administered 2015-05-28: 4 mg via INTRAVENOUS
  Filled 2015-05-27: qty 2

## 2015-05-27 MED ORDER — MAGNESIUM OXIDE 400 (241.3 MG) MG PO TABS
200.0000 mg | ORAL_TABLET | Freq: Every day | ORAL | Status: DC
  Administered 2015-05-27 – 2015-05-29 (×3): 200 mg via ORAL
  Filled 2015-05-27 (×3): qty 1

## 2015-05-27 MED ORDER — NITROGLYCERIN 0.4 MG SL SUBL
0.4000 mg | SUBLINGUAL_TABLET | SUBLINGUAL | Status: DC | PRN
  Administered 2015-05-29: 0.4 mg via SUBLINGUAL
  Filled 2015-05-27: qty 1

## 2015-05-27 MED ORDER — MAGNESIUM 100 MG PO TABS: 100.0000 mg | ORAL_TABLET | Freq: Two times a day (BID) | ORAL | Status: DC

## 2015-05-27 MED ORDER — NITROFURANTOIN MONOHYD MACRO 100 MG PO CAPS
100.0000 mg | ORAL_CAPSULE | Freq: Two times a day (BID) | ORAL | Status: DC
  Filled 2015-05-27 (×2): qty 1

## 2015-05-27 MED ORDER — VITAMIN B-12 1000 MCG PO TABS
1000.0000 ug | ORAL_TABLET | Freq: Every day | ORAL | Status: DC
  Administered 2015-05-27 – 2015-05-29 (×3): 1000 ug via ORAL
  Filled 2015-05-27 (×3): qty 1

## 2015-05-27 MED ORDER — LOSARTAN POTASSIUM 50 MG PO TABS: 50.0000 mg | ORAL_TABLET | Freq: Every day | ORAL | Status: DC

## 2015-05-27 MED ORDER — NITROGLYCERIN 0.4 MG SL SUBL: 0.4000 mg | SUBLINGUAL_TABLET | SUBLINGUAL | Status: DC | PRN

## 2015-05-27 MED ORDER — HEPARIN (PORCINE) IN NACL 100-0.45 UNIT/ML-% IJ SOLN
700.0000 [IU]/h | INTRAMUSCULAR | Status: DC
  Administered 2015-05-27: 700 [IU]/h via INTRAVENOUS
  Filled 2015-05-27: qty 250

## 2015-05-27 MED ORDER — TRAMADOL HCL 50 MG PO TABS: 50.0000 mg | ORAL_TABLET | Freq: Three times a day (TID) | ORAL | Status: DC | PRN

## 2015-05-27 MED ORDER — CALCIUM CARBONATE-VITAMIN D 600-400 MG-UNIT PO TABS: 1.0000 | ORAL_TABLET | Freq: Two times a day (BID) | ORAL | Status: DC

## 2015-05-27 MED ORDER — HYDRALAZINE HCL 20 MG/ML IJ SOLN
10.0000 mg | Freq: Four times a day (QID) | INTRAMUSCULAR | Status: DC | PRN
  Administered 2015-05-27 (×2): 10 mg via INTRAVENOUS
  Filled 2015-05-27 (×2): qty 1

## 2015-05-27 MED ORDER — HEPARIN BOLUS VIA INFUSION
3500.0000 [IU] | Freq: Once | INTRAVENOUS | Status: AC
  Administered 2015-05-27: 3500 [IU] via INTRAVENOUS
  Filled 2015-05-27: qty 3500

## 2015-05-27 MED ORDER — CLOPIDOGREL BISULFATE 75 MG PO TABS
75.0000 mg | ORAL_TABLET | Freq: Every day | ORAL | Status: DC
  Administered 2015-05-27 – 2015-05-29 (×3): 75 mg via ORAL
  Filled 2015-05-27 (×3): qty 1

## 2015-05-27 MED ORDER — VITAMIN D 1000 UNITS PO TABS
1000.0000 [IU] | ORAL_TABLET | Freq: Every day | ORAL | Status: DC
  Administered 2015-05-27 – 2015-05-29 (×3): 1000 [IU] via ORAL
  Filled 2015-05-27 (×3): qty 1

## 2015-05-27 MED ORDER — SIMVASTATIN 10 MG PO TABS
10.0000 mg | ORAL_TABLET | Freq: Every day | ORAL | Status: DC
  Administered 2015-05-27 – 2015-05-28 (×2): 10 mg via ORAL
  Filled 2015-05-27 (×2): qty 1

## 2015-05-27 MED ORDER — NYSTATIN 100000 UNIT/GM EX POWD
Freq: Three times a day (TID) | CUTANEOUS | Status: DC
  Administered 2015-05-27 – 2015-05-29 (×7): via TOPICAL
  Filled 2015-05-27: qty 15

## 2015-05-27 MED ORDER — SERTRALINE HCL 25 MG PO TABS
25.0000 mg | ORAL_TABLET | Freq: Every day | ORAL | Status: DC
  Administered 2015-05-27 – 2015-05-29 (×3): 25 mg via ORAL
  Filled 2015-05-27 (×3): qty 1

## 2015-05-27 MED ORDER — HYDRALAZINE HCL 20 MG/ML IJ SOLN
10.0000 mg | INTRAMUSCULAR | Status: DC | PRN
  Administered 2015-05-28 – 2015-05-29 (×2): 10 mg via INTRAVENOUS
  Filled 2015-05-27 (×2): qty 1

## 2015-05-27 MED ORDER — LOSARTAN POTASSIUM 50 MG PO TABS
100.0000 mg | ORAL_TABLET | Freq: Every day | ORAL | Status: DC
  Administered 2015-05-27 – 2015-05-29 (×3): 100 mg via ORAL
  Filled 2015-05-27 (×3): qty 2

## 2015-05-27 MED ORDER — CEPHALEXIN 500 MG PO CAPS
500.0000 mg | ORAL_CAPSULE | Freq: Two times a day (BID) | ORAL | Status: DC
  Administered 2015-05-27 – 2015-05-29 (×4): 500 mg via ORAL
  Filled 2015-05-27 (×4): qty 1

## 2015-05-27 MED ORDER — VITAMINS A & D EX OINT: TOPICAL_OINTMENT | Freq: Two times a day (BID) | CUTANEOUS | Status: DC | PRN

## 2015-05-27 MED ORDER — ACETAMINOPHEN 325 MG PO TABS
650.0000 mg | ORAL_TABLET | ORAL | Status: DC | PRN
  Administered 2015-05-27: 650 mg via ORAL
  Filled 2015-05-27: qty 2

## 2015-05-27 MED ORDER — BARRIER CREAM NON-SPECIFIED: 1.0000 "application " | TOPICAL_CREAM | Freq: Two times a day (BID) | TOPICAL | Status: DC | PRN

## 2015-05-27 MED ORDER — ALPRAZOLAM 0.5 MG PO TABS
0.5000 mg | ORAL_TABLET | Freq: Three times a day (TID) | ORAL | Status: DC | PRN
  Administered 2015-05-27 – 2015-05-29 (×4): 0.5 mg via ORAL
  Filled 2015-05-27 (×4): qty 1

## 2015-05-27 MED ORDER — CALCIUM CARBONATE-VITAMIN D 500-200 MG-UNIT PO TABS
1.0000 | ORAL_TABLET | Freq: Two times a day (BID) | ORAL | Status: DC
  Administered 2015-05-27 – 2015-05-29 (×5): 1 via ORAL
  Filled 2015-05-27 (×5): qty 1

## 2015-05-27 MED ORDER — POTASSIUM CHLORIDE ER 10 MEQ PO TBCR
10.0000 meq | EXTENDED_RELEASE_TABLET | Freq: Every day | ORAL | Status: DC
  Administered 2015-05-27 – 2015-05-29 (×3): 10 meq via ORAL
  Filled 2015-05-27 (×6): qty 1

## 2015-05-27 MED ORDER — CLONIDINE HCL 0.1 MG PO TABS
0.2000 mg | ORAL_TABLET | Freq: Two times a day (BID) | ORAL | Status: DC
  Administered 2015-05-27 – 2015-05-29 (×4): 0.2 mg via ORAL
  Filled 2015-05-27 (×4): qty 2

## 2015-05-27 MED ORDER — ASPIRIN 300 MG RE SUPP
300.0000 mg | RECTAL | Status: AC
Start: 2015-05-27 — End: 2015-05-28

## 2015-05-27 MED ORDER — LEVOTHYROXINE SODIUM 50 MCG PO TABS
25.0000 ug | ORAL_TABLET | Freq: Every day | ORAL | Status: DC
  Administered 2015-05-27: 25 ug via ORAL
  Filled 2015-05-27: qty 1

## 2015-05-27 NOTE — Progress Notes (Signed)
Notified Dr. Sherryll Burger that patient's blood pressure is still elevated. Heart rate is currently in the low 50's. MD stated he will order prn orders. Also notified MD of patient's slight UTI, needing antibiotics. MD to order.

## 2015-05-27 NOTE — Progress Notes (Signed)
Patient complained about her blood pressure; said "My blood pressure is high I can feel it." Patient is very anxious and RN assessed the patient's BP. BP 227/93. Notified MD of situation and MD changed PRN hydralazine 10mg  IV from q6h to q4h and ordered one dose to be given STAT. Dose administered along with PRN Xanax. Will recheck BP after 30 minutes. Nursing staff will continue to monitor. Lamonte Richer, RN

## 2015-05-27 NOTE — Progress Notes (Signed)
ANTICOAGULATION CONSULT NOTE - Initial Consult  Pharmacy Consult for heparin drip Indication: ACS  Allergies  Allergen Reactions  . Benicar [Olmesartan] Other (See Comments)    Reaction:  Unknown   . Cardura [Doxazosin Mesylate] Other (See Comments)    Reaction:  Unknown   . Ciprofloxacin Other (See Comments)    Reaction:  Unknown  . Fosamax [Alendronate Sodium] Other (See Comments)    Reaction:  Unknown  . Lisinopril Other (See Comments)    Reaction:  Unknown   . Norvasc [Amlodipine Besylate] Other (See Comments)    Reaction:  Unknown   . Penicillins Other (See Comments)    Reaction:  Unknown   . Sulfa Antibiotics Other (See Comments)    Reaction:  Unknown   . Torsemide Anxiety    Patient Measurements: Height: 5\' 2"  (157.5 cm) Weight: 124 lb 14.4 oz (56.654 kg) IBW/kg (Calculated) : 50.1 Heparin Dosing Weight: 59kg  Vital Signs: Temp: 98.3 F (36.8 C) (11/17 1130) Temp Source: Oral (11/17 1130) BP: 137/51 mmHg (11/17 1400) Pulse Rate: 64 (11/17 1400)  Labs:  Recent Labs  05/26/15 2207 05/27/15 0306 05/27/15 0834 05/27/15 1305  HGB 10.9*  --   --  11.2*  HCT 31.9*  --   --  33.9*  PLT 333  --   --  337  APTT  --  29  --   --   LABPROT  --  14.7  --   --   INR  --  1.13  --   --   HEPARINUNFRC  --   --   --  0.36  CREATININE 0.92  --   --   --   TROPONINI 0.04* 0.16* 0.14* 0.11*    Estimated Creatinine Clearance: 30.2 mL/min (by C-G formula based on Cr of 0.92).   Medical History: Past Medical History  Diagnosis Date  . HTN (hypertension)   . Coronary artery disease   . Glaucoma   . Osteoporosis       Assessment: 81 yo female here with chest pain starting on heparin drip for NSTEMI.  Hgb 10.9  plt 333 INR 1.13  aPTT 29   Goal of Therapy:  Heparin level 0.3-0.7 units/ml Monitor platelets by anticoagulation protocol: Yes   Plan:  3500 unit bolus and initial rate of 700 units/hr. Check first heparin level 8 hours after start of  infusion.  11/17: First HL at 1305 = 0.36, within goal range.  Will order confirmatory level for 2100 (8h) CBC in AM - Hgb and Plt are stable today from yesterday  Pharmacy will continue to follow.   Crist Fat L 05/27/2015,3:37 PM

## 2015-05-27 NOTE — Progress Notes (Addendum)
Patient's heart rate while sleeping this morning went as low as 46. Upon awaking patient's heart rate is in the high 50's. BP was 179/66. Patient is asymptomatic, and states she does not remember being told her heart rate is low, but did state her doctor took her off metoprolol. Once able to get in touch with Dr. Juliann Pares, MD stated to hold clonidine and change losartan to 100mg . Heart rate is currently 51 while patient is resting. Will continue to monitor.

## 2015-05-27 NOTE — Progress Notes (Addendum)
Patient arrived to 2A Room 234. Patient denies pain and all questions answered. Patient oriented to unit and Fall Safety Plan signed. Skin assessment completed with Hansel Starling RN and no skin issues noted other than some ecchymosis on the patient's arms. VS on admission: T 98, BP 121/50, HR 56, RR 18, SpO2 98% on RA. Patient reported having multiple loose, watery stools at home and has been placed on Enteric precautions for r/o C-diff. Nursing staff will continue to monitor. Lamonte Richer, RN

## 2015-05-27 NOTE — Consult Note (Signed)
Reason for Consult: chest pain left bundle branch block angina Referring Physician:  Dr. Manuella Ghazi  hospitalist  Anita Ward is an 82 y.o. female.  HPI:  81 year old female known coronary disease history of coronary bypass surgery complains of anginal symptoms lasted for about an hour and she had a dull ache in her chest after eating. Patient of the blood pressure and found a was severely elevated she took an extra clonidine her symptom did not improve she felt pressure in her head which usually seen in right to her that her blood pressure was a she denied any shortness of breath she has had some nausea she called EMS and an  New would changes on EKG showed new left bundle branch block the she had some mild confusion AAA for borderline elevated so she was advised to be admitted for further evaluation and care as well as blood pressure control.  Patient had poorly controlled blood pressure systolic of 570. Now with borderline troponin symptoms of improved here for cardiac evaluation.  Past Medical History  Diagnosis Date  . HTN (hypertension)   . Coronary artery disease   . Glaucoma   . Osteoporosis     Past Surgical History  Procedure Laterality Date  . Cardiac surgery    . Cardiac surgery    . Coronary artery bypass graft      Family History  Problem Relation Age of Onset  . Congestive Heart Failure Father   . CAD Brother     Social History:  reports that she has never smoked. She does not have any smokeless tobacco history on file. She reports that she does not drink alcohol or use illicit drugs.  Allergies:  Allergies  Allergen Reactions  . Benicar [Olmesartan] Other (See Comments)    Reaction:  Unknown   . Cardura [Doxazosin Mesylate] Other (See Comments)    Reaction:  Unknown   . Ciprofloxacin Other (See Comments)    Reaction:  Unknown  . Fosamax [Alendronate Sodium] Other (See Comments)    Reaction:  Unknown  . Lisinopril Other (See Comments)    Reaction:  Unknown   .  Norvasc [Amlodipine Besylate] Other (See Comments)    Reaction:  Unknown   . Penicillins Other (See Comments)    Reaction:  Unknown   . Sulfa Antibiotics Other (See Comments)    Reaction:  Unknown   . Torsemide Anxiety    Medications: I have reviewed the patient's current medications.  Results for orders placed or performed during the hospital encounter of 05/26/15 (from the past 48 hour(s))  CBC     Status: Abnormal   Collection Time: 05/26/15 10:07 PM  Result Value Ref Range   WBC 11.8 (H) 3.6 - 11.0 K/uL   RBC 4.11 3.80 - 5.20 MIL/uL   Hemoglobin 10.9 (L) 12.0 - 16.0 g/dL   HCT 31.9 (L) 35.0 - 47.0 %   MCV 77.7 (L) 80.0 - 100.0 fL   MCH 26.6 26.0 - 34.0 pg   MCHC 34.3 32.0 - 36.0 g/dL   RDW 15.5 (H) 11.5 - 14.5 %   Platelets 333 150 - 440 K/uL  Comprehensive metabolic panel     Status: Abnormal   Collection Time: 05/26/15 10:07 PM  Result Value Ref Range   Sodium 134 (L) 135 - 145 mmol/L   Potassium 3.3 (L) 3.5 - 5.1 mmol/L   Chloride 101 101 - 111 mmol/L   CO2 27 22 - 32 mmol/L   Glucose, Bld 115 (H) 65 -  99 mg/dL   BUN 10 6 - 20 mg/dL   Creatinine, Ser 0.92 0.44 - 1.00 mg/dL   Calcium 8.7 (L) 8.9 - 10.3 mg/dL   Total Protein 7.3 6.5 - 8.1 g/dL   Albumin 3.4 (L) 3.5 - 5.0 g/dL   AST 22 15 - 41 U/L   ALT 10 (L) 14 - 54 U/L   Alkaline Phosphatase 56 38 - 126 U/L   Total Bilirubin 0.6 0.3 - 1.2 mg/dL   GFR calc non Af Amer 52 (L) >60 mL/min   GFR calc Af Amer >60 >60 mL/min    Comment: (NOTE) The eGFR has been calculated using the CKD EPI equation. This calculation has not been validated in all clinical situations. eGFR's persistently <60 mL/min signify possible Chronic Kidney Disease.    Anion gap 6 5 - 15  Brain natriuretic peptide (order if patient c/o SOB ONLY)     Status: Abnormal   Collection Time: 05/26/15 10:07 PM  Result Value Ref Range   B Natriuretic Peptide 660.0 (H) 0.0 - 100.0 pg/mL  Troponin I     Status: Abnormal   Collection Time: 05/26/15  10:07 PM  Result Value Ref Range   Troponin I 0.04 (H) <0.031 ng/mL    Comment: READ BACK AND VERIFIED BY AMANDA LOVETT _0  05/26/15.Marland KitchenMarland KitchenAJO        PERSISTENTLY INCREASED TROPONIN VALUES IN THE RANGE OF 0.04-0.49 ng/mL CAN BE SEEN IN:       -UNSTABLE ANGINA       -CONGESTIVE HEART FAILURE       -MYOCARDITIS       -CHEST TRAUMA       -ARRYHTHMIAS       -LATE PRESENTING MYOCARDIAL INFARCTION       -COPD   CLINICAL FOLLOW-UP RECOMMENDED.   Urinalysis complete, with microscopic ([facility] only)     Status: Abnormal   Collection Time: 05/26/15 10:07 PM  Result Value Ref Range   Color, Urine COLORLESS (A) YELLOW   APPearance CLEAR (A) CLEAR   Glucose, UA NEGATIVE NEGATIVE mg/dL   Bilirubin Urine NEGATIVE NEGATIVE   Ketones, ur NEGATIVE NEGATIVE mg/dL   Specific Gravity, Urine 1.003 (L) 1.005 - 1.030   Hgb urine dipstick NEGATIVE NEGATIVE   pH 8.0 5.0 - 8.0   Protein, ur 30 (A) NEGATIVE mg/dL   Nitrite NEGATIVE NEGATIVE   Leukocytes, UA TRACE (A) NEGATIVE   RBC / HPF 0-5 0 - 5 RBC/hpf   WBC, UA 0-5 0 - 5 WBC/hpf   Bacteria, UA NONE SEEN NONE SEEN   Squamous Epithelial / LPF 0-5 (A) NONE SEEN  TSH     Status: Abnormal   Collection Time: 05/27/15  3:06 AM  Result Value Ref Range   TSH 6.908 (H) 0.350 - 4.500 uIU/mL  Troponin I     Status: Abnormal   Collection Time: 05/27/15  3:06 AM  Result Value Ref Range   Troponin I 0.16 (H) <0.031 ng/mL    Comment: RESULTS PREVIOUSLY CALLED 05/26/15 _1  BY AJO...AJO        PERSISTENTLY INCREASED TROPONIN VALUES IN THE RANGE OF 0.04-0.49 ng/mL CAN BE SEEN IN:       -UNSTABLE ANGINA       -CONGESTIVE HEART FAILURE       -MYOCARDITIS       -CHEST TRAUMA       -ARRYHTHMIAS       -LATE PRESENTING MYOCARDIAL INFARCTION       -COPD   CLINICAL FOLLOW-UP  RECOMMENDED.   APTT     Status: None   Collection Time: 05/27/15  3:06 AM  Result Value Ref Range   aPTT 29 24 - 36 seconds  Protime-INR     Status: None   Collection Time: 05/27/15   3:06 AM  Result Value Ref Range   Prothrombin Time 14.7 11.4 - 15.0 seconds   INR 1.13   Troponin I     Status: Abnormal   Collection Time: 05/27/15  8:34 AM  Result Value Ref Range   Troponin I 0.14 (H) <0.031 ng/mL    Comment: RESULTS PREVIOUSLY CALLED _0  ON 05/26/15 BY AJO.Marland KitchenHP        PERSISTENTLY INCREASED TROPONIN VALUES IN THE RANGE OF 0.04-0.49 ng/mL CAN BE SEEN IN:       -UNSTABLE ANGINA       -CONGESTIVE HEART FAILURE       -MYOCARDITIS       -CHEST TRAUMA       -ARRYHTHMIAS       -LATE PRESENTING MYOCARDIAL INFARCTION       -COPD   CLINICAL FOLLOW-UP RECOMMENDED.   Lipid panel     Status: Abnormal   Collection Time: 05/27/15  8:34 AM  Result Value Ref Range   Cholesterol 129 0 - 200 mg/dL   Triglycerides 73 <150 mg/dL   HDL 34 (L) >40 mg/dL   Total CHOL/HDL Ratio 3.8 RATIO   VLDL 15 0 - 40 mg/dL   LDL Cholesterol 80 0 - 99 mg/dL    Comment:        Total Cholesterol/HDL:CHD Risk Coronary Heart Disease Risk Table                     Men   Women  1/2 Average Risk   3.4   3.3  Average Risk       5.0   4.4  2 X Average Risk   9.6   7.1  3 X Average Risk  23.4   11.0        Use the calculated Patient Ratio above and the CHD Risk Table to determine the patient's CHD Risk.        ATP III CLASSIFICATION (LDL):  <100     mg/dL   Optimal  100-129  mg/dL   Near or Above                    Optimal  130-159  mg/dL   Borderline  160-189  mg/dL   High  >190     mg/dL   Very High   Troponin I     Status: Abnormal   Collection Time: 05/27/15  1:05 PM  Result Value Ref Range   Troponin I 0.11 (H) <0.031 ng/mL    Comment: RESULTS PREVIOUSLY CALLED @ 2300 05/26/15 BY AJO/TCH        PERSISTENTLY INCREASED TROPONIN VALUES IN THE RANGE OF 0.04-0.49 ng/mL CAN BE SEEN IN:       -UNSTABLE ANGINA       -CONGESTIVE HEART FAILURE       -MYOCARDITIS       -CHEST TRAUMA       -ARRYHTHMIAS       -LATE PRESENTING MYOCARDIAL INFARCTION       -COPD   CLINICAL FOLLOW-UP  RECOMMENDED.   Heparin level (unfractionated)     Status: None   Collection Time: 05/27/15  1:05 PM  Result Value Ref Range   Heparin Unfractionated 0.36  0.30 - 0.70 IU/mL    Comment:        IF HEPARIN RESULTS ARE BELOW EXPECTED VALUES, AND PATIENT DOSAGE HAS BEEN CONFIRMED, SUGGEST FOLLOW UP TESTING OF ANTITHROMBIN III LEVELS.   CBC     Status: Abnormal   Collection Time: 05/27/15  1:05 PM  Result Value Ref Range   WBC 10.0 3.6 - 11.0 K/uL   RBC 4.36 3.80 - 5.20 MIL/uL   Hemoglobin 11.2 (L) 12.0 - 16.0 g/dL   HCT 33.9 (L) 35.0 - 47.0 %   MCV 77.7 (L) 80.0 - 100.0 fL   MCH 25.7 (L) 26.0 - 34.0 pg   MCHC 33.0 32.0 - 36.0 g/dL   RDW 15.5 (H) 11.5 - 14.5 %   Platelets 337 150 - 440 K/uL    Dg Chest 2 View  05/26/2015  CLINICAL DATA:  Pt here from home via ems pt states she felt like her bp was getting high so she took her own BP 200/99, pt states she was also having chest pressure feeling like something was sitting on her chest as well as head pressure. Pt .*comment was truncated* EXAM: CHEST - 2 VIEW, increased since previous exam. COMPARISON:  05/17/2015 and previous FINDINGS: Previous CABG. Mild cardiomegaly. Pulmonary hyperinflation with coarse bibasilar interstitial opacities, no confluent airspace disease or overt edema. Atheromatous aorta. No effusion.  No pneumothorax. Stable mild compression deformity at the thoracolumbar junction. IMPRESSION: 1. Cardiomegaly and chronic bibasilar interstitial changes. No acute disease. Electronically Signed   By: Lucrezia Europe M.D.   On: 05/26/2015 22:35    Review of Systems  Constitutional: Positive for malaise/fatigue.  HENT: Negative.   Eyes: Negative.   Respiratory: Positive for shortness of breath.   Cardiovascular: Positive for chest pain.  Gastrointestinal: Negative.   Genitourinary: Negative.   Musculoskeletal: Negative.   Skin: Negative.   Neurological: Positive for weakness.  Endo/Heme/Allergies: Negative.    Psychiatric/Behavioral: Negative.    Blood pressure 137/51, pulse 64, temperature 98.3 F (36.8 C), temperature source Oral, resp. rate 16, height 5' 2" (1.575 m), weight 56.654 kg (124 lb 14.4 oz), SpO2 98 %. Physical Exam  Nursing note and vitals reviewed. Constitutional: She is oriented to person, place, and time. She appears well-developed and well-nourished.  HENT:  Head: Normocephalic and atraumatic.  Eyes: Conjunctivae and EOM are normal. Pupils are equal, round, and reactive to light.  Neck: Normal range of motion. Neck supple.  Cardiovascular: Normal rate and regular rhythm.   Respiratory: Effort normal and breath sounds normal.  GI: Soft. Bowel sounds are normal.  Musculoskeletal: Normal range of motion.  Neurological: She is alert and oriented to person, place, and time. She has normal reflexes.  Skin: Skin is warm and dry.  Psychiatric: She has a normal mood and affect.    Assessment/Plan:  chest pain  angina  coronary disease  elevated troponin  new left bundle branch block  demand ischemia  anxiety  hypertension Poor control  UTI  thyroid disease  hyperlipidemia . PLAN  rule out for myocardial infarction  continue telemetry  agree with aspirin and Plavix therapy  recommend medical therapy  start Imdur or 30 mg once a day  short-term anticoagulation  With heparin  hypertension control  With losartan hydralazine clonidine  continue antibiotic therapy for UTI  continue simvastatin therapy for lipid management  do not recommend functional study or catheterization  recommend conservative therapy medically   CALLWOOD,DWAYNE D. 05/27/2015, 4:14 PM

## 2015-05-27 NOTE — H&P (Addendum)
Anita Ward is an 81 y.o. female.   Chief Complaint: Chest pain HPI: The patient presents emergency department via EMS after an episode of chest pain that lasted more than an hour. The patient states that she felt a dull ache in her chest after she finished eating. She took her blood pressure and found the systolic reading to be greater than 200. She took an extra half clonidine tablet and felt somewhat better but her chest pain did not ease until after arrival in the emergency department where she was given aspirin. He denies radiation of the pain but admits to some nausea. The patient denies diaphoresis. Oxygen saturations at the time of EMS arrival approximately 87% on room air but the patient denies shortness of breath at the time of our interview. EKG shows left bundle branch block which is a new rhythm. Notably there was some confusion on the part of EMS and/or family whether this rhythm was new. Review of EKGs in medical records shows no left bundle branch block and 3 previous rhythm strips obtained within the last month. Thus the patient was admitted for myocardial infarct.  Past Medical History  Diagnosis Date  . HTN (hypertension)   . Coronary artery disease   . Glaucoma   . Osteoporosis     Past Surgical History  Procedure Laterality Date  . Cardiac surgery    . Cardiac surgery    . Coronary artery bypass graft      Family History  Problem Relation Age of Onset  . Congestive Heart Failure Father   . CAD Brother    Social History:  reports that she has never smoked. She does not have any smokeless tobacco history on file. She reports that she does not drink alcohol or use illicit drugs.  Allergies:  Allergies  Allergen Reactions  . Benicar [Olmesartan] Other (See Comments)    Reaction:  Unknown   . Cardura [Doxazosin Mesylate] Other (See Comments)    Reaction:  Unknown   . Ciprofloxacin Other (See Comments)    Reaction:  Unknown  . Fosamax [Alendronate Sodium] Other  (See Comments)    Reaction:  Unknown  . Lisinopril Other (See Comments)    Reaction:  Unknown   . Norvasc [Amlodipine Besylate] Other (See Comments)    Reaction:  Unknown   . Penicillins Other (See Comments)    Reaction:  Unknown   . Sulfa Antibiotics Other (See Comments)    Reaction:  Unknown   . Torsemide Anxiety    Medications Prior to Admission  Medication Sig Dispense Refill  . ALPRAZolam (XANAX) 0.5 MG tablet Take 1 tablet (0.5 mg total) by mouth 3 (three) times daily as needed for sleep or anxiety. 60 tablet 0  . Calcium Carbonate-Vitamin D (CALCIUM 600+D) 600-400 MG-UNIT tablet Take 1 tablet by mouth 2 (two) times daily.    . cholecalciferol (VITAMIN D) 1000 UNITS tablet Take 1,000 Units by mouth daily.    . cloNIDine (CATAPRES) 0.2 MG tablet Take 0.2 mg by mouth 2 (two) times daily.    Marland Kitchen CRANBERRY PO Take 1 capsule by mouth daily.    Marland Kitchen levothyroxine (SYNTHROID, LEVOTHROID) 25 MCG tablet Take 25 mcg by mouth daily.     Marland Kitchen losartan (COZAAR) 50 MG tablet Take 50 mg by mouth daily.    . Magnesium 100 MG TABS Take 100 mg by mouth 2 (two) times daily.    . nitroGLYCERIN (NITROSTAT) 0.4 MG SL tablet Place 0.4 mg under the tongue every 5 (  five) minutes as needed for chest pain.    . potassium chloride (K-DUR) 10 MEQ tablet Take 10 mEq by mouth daily.    . sertraline (ZOLOFT) 25 MG tablet Take 25 mg by mouth daily.    . traMADol (ULTRAM) 50 MG tablet Take 50 mg by mouth 3 (three) times daily as needed for moderate pain.    . vitamin B-12 (CYANOCOBALAMIN) 1000 MCG tablet Take 1,000 mcg by mouth daily.    . feeding supplement, ENSURE ENLIVE, (ENSURE ENLIVE) LIQD Take 237 mLs by mouth 2 (two) times daily between meals. (Patient not taking: Reported on 05/26/2015) 237 mL 12  . furosemide (LASIX) 40 MG tablet Take 1 tablet (40 mg total) by mouth daily. (Patient not taking: Reported on 05/26/2015) 30 tablet 5  . metoprolol succinate (TOPROL-XL) 50 MG 24 hr tablet 1 tab po qd (Patient not  taking: Reported on 05/26/2015) 30 tablet 3    Results for orders placed or performed during the hospital encounter of 05/26/15 (from the past 48 hour(s))  CBC     Status: Abnormal   Collection Time: 05/26/15 10:07 PM  Result Value Ref Range   WBC 11.8 (H) 3.6 - 11.0 K/uL   RBC 4.11 3.80 - 5.20 MIL/uL   Hemoglobin 10.9 (L) 12.0 - 16.0 g/dL   HCT 31.9 (L) 35.0 - 47.0 %   MCV 77.7 (L) 80.0 - 100.0 fL   MCH 26.6 26.0 - 34.0 pg   MCHC 34.3 32.0 - 36.0 g/dL   RDW 15.5 (H) 11.5 - 14.5 %   Platelets 333 150 - 440 K/uL  Comprehensive metabolic panel     Status: Abnormal   Collection Time: 05/26/15 10:07 PM  Result Value Ref Range   Sodium 134 (L) 135 - 145 mmol/L   Potassium 3.3 (L) 3.5 - 5.1 mmol/L   Chloride 101 101 - 111 mmol/L   CO2 27 22 - 32 mmol/L   Glucose, Bld 115 (H) 65 - 99 mg/dL   BUN 10 6 - 20 mg/dL   Creatinine, Ser 0.92 0.44 - 1.00 mg/dL   Calcium 8.7 (L) 8.9 - 10.3 mg/dL   Total Protein 7.3 6.5 - 8.1 g/dL   Albumin 3.4 (L) 3.5 - 5.0 g/dL   AST 22 15 - 41 U/L   ALT 10 (L) 14 - 54 U/L   Alkaline Phosphatase 56 38 - 126 U/L   Total Bilirubin 0.6 0.3 - 1.2 mg/dL   GFR calc non Af Amer 52 (L) >60 mL/min   GFR calc Af Amer >60 >60 mL/min    Comment: (NOTE) The eGFR has been calculated using the CKD EPI equation. This calculation has not been validated in all clinical situations. eGFR's persistently <60 mL/min signify possible Chronic Kidney Disease.    Anion gap 6 5 - 15  Brain natriuretic peptide (order if patient c/o SOB ONLY)     Status: Abnormal   Collection Time: 05/26/15 10:07 PM  Result Value Ref Range   B Natriuretic Peptide 660.0 (H) 0.0 - 100.0 pg/mL  Troponin I     Status: Abnormal   Collection Time: 05/26/15 10:07 PM  Result Value Ref Range   Troponin I 0.04 (H) <0.031 ng/mL    Comment: READ BACK AND VERIFIED BY AMANDA LOVETT _0  05/26/15.Marland KitchenMarland KitchenAJO        PERSISTENTLY INCREASED TROPONIN VALUES IN THE RANGE OF 0.04-0.49 ng/mL CAN BE SEEN IN:        -UNSTABLE ANGINA       -CONGESTIVE  HEART FAILURE       -MYOCARDITIS       -CHEST TRAUMA       -ARRYHTHMIAS       -LATE PRESENTING MYOCARDIAL INFARCTION       -COPD   CLINICAL FOLLOW-UP RECOMMENDED.   Urinalysis complete, with microscopic ([facility] only)     Status: Abnormal   Collection Time: 05/26/15 10:07 PM  Result Value Ref Range   Color, Urine COLORLESS (A) YELLOW   APPearance CLEAR (A) CLEAR   Glucose, UA NEGATIVE NEGATIVE mg/dL   Bilirubin Urine NEGATIVE NEGATIVE   Ketones, ur NEGATIVE NEGATIVE mg/dL   Specific Gravity, Urine 1.003 (L) 1.005 - 1.030   Hgb urine dipstick NEGATIVE NEGATIVE   pH 8.0 5.0 - 8.0   Protein, ur 30 (A) NEGATIVE mg/dL   Nitrite NEGATIVE NEGATIVE   Leukocytes, UA TRACE (A) NEGATIVE   RBC / HPF 0-5 0 - 5 RBC/hpf   WBC, UA 0-5 0 - 5 WBC/hpf   Bacteria, UA NONE SEEN NONE SEEN   Squamous Epithelial / LPF 0-5 (A) NONE SEEN   Dg Chest 2 View  05/26/2015  CLINICAL DATA:  Pt here from home via ems pt states she felt like her bp was getting high so she took her own BP 200/99, pt states she was also having chest pressure feeling like something was sitting on her chest as well as head pressure. Pt .*comment was truncated* EXAM: CHEST - 2 VIEW, increased since previous exam. COMPARISON:  05/17/2015 and previous FINDINGS: Previous CABG. Mild cardiomegaly. Pulmonary hyperinflation with coarse bibasilar interstitial opacities, no confluent airspace disease or overt edema. Atheromatous aorta. No effusion.  No pneumothorax. Stable mild compression deformity at the thoracolumbar junction. IMPRESSION: 1. Cardiomegaly and chronic bibasilar interstitial changes. No acute disease. Electronically Signed   By: Lucrezia Europe M.D.   On: 05/26/2015 22:35    Review of Systems  Constitutional: Negative for fever and chills.  HENT: Negative for sore throat and tinnitus.   Eyes: Negative for blurred vision and redness.  Respiratory: Negative for cough and shortness of breath.    Cardiovascular: Positive for chest pain (resolved). Negative for palpitations, orthopnea and PND.  Gastrointestinal: Positive for nausea (resolved). Negative for vomiting, abdominal pain and diarrhea.  Genitourinary: Negative for dysuria, urgency and frequency.  Musculoskeletal: Negative for myalgias and joint pain.  Skin: Negative for rash.       No lesions  Neurological: Negative for speech change, focal weakness and weakness.  Endo/Heme/Allergies: Does not bruise/bleed easily.       No temperature intolerance  Psychiatric/Behavioral: Negative for depression and suicidal ideas.    Blood pressure 121/50, pulse 56, temperature 98 F (36.7 C), temperature source Oral, resp. rate 18, height _0  (1.575 m), weight 58.968 kg (130 lb), SpO2 98 %. Physical Exam  Vitals reviewed. Constitutional: She is oriented to person, place, and time. She appears well-developed and well-nourished.  HENT:  Head: Normocephalic and atraumatic.  Eyes: EOM are normal. Pupils are equal, round, and reactive to light.  Neck: Normal range of motion. No tracheal deviation present. No thyromegaly present.  Cardiovascular: Normal rate, regular rhythm and normal heart sounds.  Exam reveals no gallop and no friction rub.   No murmur heard. Respiratory: Effort normal and breath sounds normal.  GI: Soft. Bowel sounds are normal. She exhibits no distension. There is no tenderness.  Genitourinary:  Deferred  Musculoskeletal: Normal range of motion. She exhibits no edema.  Lymphadenopathy:    She has no  cervical adenopathy.  Neurological: She is alert and oriented to person, place, and time. No cranial nerve deficit. She exhibits normal muscle tone.  Skin: Skin is warm and dry.  Psychiatric: She has a normal mood and affect. Her behavior is normal. Judgment and thought content normal.     Assessment/Plan This is a 81 year old Caucasian female with coronary artery disease status post CABG admitted for NSTEMI. 1.  Myocardial infarction: NSTEMI as evidenced by new onset left bundle branch block. The patient on a heparin drip. We will continue to trend cardiac enzymes. Cardiology has been consulted for possible catheterization. 2. Coronary artery disease: Continue aspirin. I have started the patient on Plavix as well 3. Essential hypertension: Continue losartan and clonidine. Heart rate is well controlled 4. Hypothyroidism: Continue Synthroid 5. Osteoporosis: Continue vitamin D and calcium supplementation 6. Hyperlipidemia: Continue simvastatin 7. Depression: Continue sertraline 8. DVT prophylaxis: Systemic anticoagulation as above 9. GI prophylaxis: None The patient is a full code. Time spent on admission orders and patient care approximately 45 minutes  Harrie Foreman 05/27/2015, 3:02 AM

## 2015-05-27 NOTE — Progress Notes (Signed)
[facility] - [place] at [facility]   PATIENT NAME: Anita Ward    MR#:  [id number]  DATE OF BIRTH:  May 09, 1920  SUBJECTIVE:  CHIEF COMPLAINT:   Chief Complaint  Patient presents with  . Chest Pain  feels much better REVIEW OF SYSTEMS:  Review of Systems  Constitutional: Negative for fever, weight loss, malaise/fatigue and diaphoresis.  HENT: Negative for ear discharge, ear pain, hearing loss, nosebleeds, sore throat and tinnitus.   Eyes: Negative for blurred vision and pain.  Respiratory: Negative for cough, hemoptysis, shortness of breath and wheezing.   Cardiovascular: Positive for chest pain. Negative for palpitations, orthopnea and leg swelling.  Gastrointestinal: Negative for heartburn, nausea, vomiting, abdominal pain, diarrhea, constipation and blood in stool.  Genitourinary: Negative for dysuria, urgency and frequency.  Musculoskeletal: Negative for myalgias and back pain.  Skin: Negative for itching and rash.  Neurological: Negative for dizziness, tingling, tremors, focal weakness, seizures, weakness and headaches.  Psychiatric/Behavioral: Negative for depression. The patient is not nervous/anxious.     DRUG ALLERGIES:   Allergies  Allergen Reactions  . Benicar [Olmesartan] Other (See Comments)    Reaction:  Unknown   . Cardura [Doxazosin Mesylate] Other (See Comments)    Reaction:  Unknown   . Ciprofloxacin Other (See Comments)    Reaction:  Unknown  . Fosamax [Alendronate Sodium] Other (See Comments)    Reaction:  Unknown  . Lisinopril Other (See Comments)    Reaction:  Unknown   . Norvasc [Amlodipine Besylate] Other (See Comments)    Reaction:  Unknown   . Penicillins Other (See Comments)    Reaction:  Unknown   . Sulfa Antibiotics Other (See Comments)    Reaction:  Unknown   . Torsemide Anxiety   VITALS:  Blood pressure 137/51, pulse 64, temperature 98.3 F (36.8 C), temperature source Oral, resp. rate 16, height  (1.575  m), weight 56.654 kg (124 lb 14.4 oz), SpO2 98 %. PHYSICAL EXAMINATION:  Physical Exam  Constitutional: She is oriented to person, place, and time and well-developed, well-nourished, and in no distress.  HENT:  Head: Normocephalic and atraumatic.  Eyes: Conjunctivae and EOM are normal. Pupils are equal, round, and reactive to light.  Neck: Normal range of motion. Neck supple. No tracheal deviation present. No thyromegaly present.  Cardiovascular: Normal rate, regular rhythm and normal heart sounds.   Pulmonary/Chest: Effort normal and breath sounds normal. No respiratory distress. She has no wheezes. She exhibits no tenderness.  Abdominal: Soft. Bowel sounds are normal. She exhibits no distension. There is no tenderness.  Musculoskeletal: Normal range of motion.  Neurological: She is alert and oriented to person, place, and time. No cranial nerve deficit.  Skin: Skin is warm and dry. No rash noted.  Psychiatric: Mood and affect normal.   LABORATORY PANEL:   CBC  Recent Labs Lab 05/27/15 1305  WBC 10.0  HGB 11.2*  HCT 33.9*  PLT 337   ------------------------------------------------------------------------------------------------------------------ Chemistries   Recent Labs Lab 05/26/15 2207  NA 134*  K 3.3*  CL 101  CO2 27  GLUCOSE 115*  BUN 10  CREATININE 0.92  CALCIUM 8.7*  AST 22  ALT 10*  ALKPHOS 56  BILITOT 0.6   RADIOLOGY:  Dg Chest 2 View  05/26/2015  CLINICAL DATA:  Pt here from home via ems pt states she felt like her bp was getting high so she took her own BP 200/99, pt states she was also having chest pressure feeling like something was  sitting on her chest as well as head pressure. Pt .*comment was truncated* EXAM: CHEST - 2 VIEW, increased since previous exam. COMPARISON:  05/17/2015 and previous FINDINGS: Previous CABG. Mild cardiomegaly. Pulmonary hyperinflation with coarse bibasilar interstitial opacities, no confluent airspace disease or overt edema.  Atheromatous aorta. No effusion.  No pneumothorax. Stable mild compression deformity at the thoracolumbar junction. IMPRESSION: 1. Cardiomegaly and chronic bibasilar interstitial changes. No acute disease. Electronically Signed   By: Corlis Leak M.D.   On: 05/26/2015 22:35   ASSESSMENT AND PLAN:  This is a 81 year old Caucasian female with coronary artery disease status post CABG admitted for NSTEMI.  1. Myocardial infarction: NSTEMI as evidenced by new onset left bundle branch block. Neg cardiac enzymes. Cardiology recommends conservative mgmt. Will stop heparin drip   2. Coronary artery disease: Continue aspirin and Plavix as well 3. Essential hypertension: Continue losartan and clonidine. Heart rate is well controlled 4. Hypothyroidism: increase dose of Synthroid as TSH up 5. Osteoporosis: Continue vitamin D and calcium supplementation 6. Hyperlipidemia: Continue simvastatin 7. Depression: Continue sertraline 8. DVT prophylaxis: Systemic anticoagulation as above 9. UTI: based on UA, start keflex     All the records are reviewed and case discussed with Care Management/Social Workerr. Management plans discussed with the patient, family and they are in agreement.  CODE STATUS: Full Code  TOTAL TIME TAKING CARE OF THIS PATIENT: 35 minutes.   More than 50% of the time was spent in counseling/coordination of care: YES  POSSIBLE D/C IN 1-2 DAYS, DEPENDING ON CLINICAL CONDITION.   [facility], Catlin Aycock M.D on 05/27/2015 at 6:19 PM  Between 7am to 6pm - Pager - [phone]  After 6pm go to [email or web address] - password [email or web address] [facility]  [facility]  Office  [phone]  CC:  Primary care physician; Barbette Reichmann, MD

## 2015-05-27 NOTE — Progress Notes (Addendum)
ANTICOAGULATION CONSULT NOTE - Initial Consult  Pharmacy Consult for heparin drip Indication: ACS  Allergies  Allergen Reactions  . Benicar [Olmesartan] Other (See Comments)    Reaction:  Unknown   . Cardura [Doxazosin Mesylate] Other (See Comments)    Reaction:  Unknown   . Ciprofloxacin Other (See Comments)    Reaction:  Unknown  . Fosamax [Alendronate Sodium] Other (See Comments)    Reaction:  Unknown  . Lisinopril Other (See Comments)    Reaction:  Unknown   . Norvasc [Amlodipine Besylate] Other (See Comments)    Reaction:  Unknown   . Penicillins Other (See Comments)    Reaction:  Unknown   . Sulfa Antibiotics Other (See Comments)    Reaction:  Unknown   . Torsemide Anxiety    Patient Measurements: Height: 5\' 2"  (157.5 cm) Weight: 130 lb (58.968 kg) IBW/kg (Calculated) : 50.1 Heparin Dosing Weight: 59kg  Vital Signs: Temp: 98 F (36.7 C) (11/17 0246) Temp Source: Oral (11/16 2201) BP: 118/51 mmHg (11/17 0328) Pulse Rate: 52 (11/17 0328)  Labs:  Recent Labs  05/26/15 2207 05/27/15 0306  HGB 10.9*  --   HCT 31.9*  --   PLT 333  --   APTT  --  29  LABPROT  --  14.7  INR  --  1.13  CREATININE 0.92  --   TROPONINI 0.04*  --     Estimated Creatinine Clearance: 30.2 mL/min (by C-G formula based on Cr of 0.92).   Medical History: Past Medical History  Diagnosis Date  . HTN (hypertension)   . Coronary artery disease   . Glaucoma   . Osteoporosis     Medications:    Assessment: Hgb 10.9  plt 333 INR 1.13  aPTT 29   Goal of Therapy:  Heparin level 0.3-0.7 units/ml Monitor platelets by anticoagulation protocol: Yes   Plan:  3500 unit bolus and initial rate of 700 units/hr. Check first heparin level 8 hours after start of infusion.  Karson Chicas S 05/27/2015,4:14 AM

## 2015-05-27 NOTE — Progress Notes (Signed)
Alert and oriented. Patient able to ambulate with just standby assist and able to transfer out of bed on her own. Heparin drip has been discontinued. Patient will most likely discharge tomorrow. No complaints of pain. Blood pressure is now stable after giving IV hydralazine. Will continue to monitor.

## 2015-05-27 NOTE — Progress Notes (Signed)
Initial Nutrition Assessment      INTERVENTION:  Meals and snacks: cater to pt preferences Medical Nutrition Supplement Therapy: If unable to meet nutritional needs will add supplement   NUTRITION DIAGNOSIS:   Inadequate oral intake related to acute illness as evidenced by per patient/family report.    GOAL:   Patient will meet greater than or equal to 90% of their needs    MONITOR:    (Energy intake)  REASON FOR ASSESSMENT:   Malnutrition Screening Tool    ASSESSMENT:      Pt admitted with UTI, MI  Past Medical History  Diagnosis Date  . HTN (hypertension)   . Coronary artery disease   . Glaucoma   . Osteoporosis     Current Nutrition: ate 1/2 hamburger today for lunch  Food/Nutrition-Related History: reports usually eats half of meals and has been eating this way for awhile   Scheduled Medications:  . aspirin  324 mg Oral NOW   Or  . aspirin  300 mg Rectal NOW  . [START ON 05/28/2015] aspirin EC  81 mg Oral Daily  . calcium-vitamin D  1 tablet Oral BID  . cholecalciferol  1,000 Units Oral Daily  . cloNIDine  0.2 mg Oral BID  . clopidogrel  75 mg Oral Q breakfast  . levothyroxine  25 mcg Oral QAC breakfast  . losartan  100 mg Oral Daily  . magnesium oxide  200 mg Oral Daily  . nitrofurantoin (macrocrystal-monohydrate)  100 mg Oral Q12H  . nystatin   Topical TID  . [START ON 05/28/2015] pneumococcal 23 valent vaccine  0.5 mL Intramuscular Tomorrow-1000  . potassium chloride  10 mEq Oral Daily  . sertraline  25 mg Oral Daily  . simvastatin  10 mg Oral q1800  . vitamin B-12  1,000 mcg Oral Daily    Continuous Medications:  . heparin 700 Units/hr (05/27/15 0450)     Electrolyte/Renal Profile and Glucose Profile:   Recent Labs Lab 05/26/15 2207  NA 134*  K 3.3*  CL 101  CO2 27  BUN 10  CREATININE 0.92  CALCIUM 8.7*  GLUCOSE 115*   Protein Profile:  Recent Labs Lab 05/26/15 2207  ALBUMIN 3.4*    Gastrointestinal  Profile: Last BM: 11/16   Nutrition-Focused Physical Exam Findings: Nutrition-Focused physical exam completed. Findings are no fat depletion, normal to mild muscle depletion, and no edema.      Weight Change: noted stable wt per wt encounters    Diet Order:  Diet Heart Room service appropriate?: Yes; Fluid consistency:: Thin  Skin:   reviewed   Height:   Ht Readings from Last 1 Encounters:  05/26/15 5\' 2"  (1.575 m)    Weight:   Wt Readings from Last 1 Encounters:  05/27/15 124 lb 14.4 oz (56.654 kg)    Ideal Body Weight:     BMI:  Body mass index is 22.84 kg/(m^2).  EDUCATION NEEDS:   No education needs identified at this time  LOW Care Level  Shivangi Lutz B. Freida Busman, RD, LDN [phone] (pager)

## 2015-05-28 LAB — CBC
HCT: 36.8 % (ref 35.0–47.0)
Hemoglobin: 12.2 g/dL (ref 12.0–16.0)
MCH: 25.8 pg — ABNORMAL LOW (ref 26.0–34.0)
MCHC: 33 g/dL (ref 32.0–36.0)
MCV: 78.1 fL — ABNORMAL LOW (ref 80.0–100.0)
PLATELETS: 363 10*3/uL (ref 150–440)
RBC: 4.72 MIL/uL (ref 3.80–5.20)
RDW: 15.7 % — AB (ref 11.5–14.5)
WBC: 13 10*3/uL — ABNORMAL HIGH (ref 3.6–11.0)

## 2015-05-28 LAB — GLUCOSE, CAPILLARY: Glucose-Capillary: 135 mg/dL — ABNORMAL HIGH (ref 65–99)

## 2015-05-28 MED ORDER — HYDRALAZINE HCL 50 MG PO TABS
50.0000 mg | ORAL_TABLET | Freq: Four times a day (QID) | ORAL | Status: DC
  Administered 2015-05-28 – 2015-05-29 (×3): 50 mg via ORAL
  Filled 2015-05-28 (×3): qty 1

## 2015-05-28 MED ORDER — HYDRALAZINE HCL 50 MG PO TABS
50.0000 mg | ORAL_TABLET | Freq: Three times a day (TID) | ORAL | Status: DC
  Administered 2015-05-28: 50 mg via ORAL
  Filled 2015-05-28: qty 1

## 2015-05-28 MED ORDER — LISINOPRIL 10 MG PO TABS
10.0000 mg | ORAL_TABLET | Freq: Every day | ORAL | Status: DC
  Administered 2015-05-28 – 2015-05-29 (×2): 10 mg via ORAL
  Filled 2015-05-28 (×2): qty 1

## 2015-05-28 NOTE — Progress Notes (Addendum)
Patient's BP at 0513 197/79. Administered one dose of hydralazine 10 mg IV at 0516. Rechecked BP at 0617: 192/71.  Paged MD and awaiting call back. Nursing staff will continue to monitor.   Update: MD returned page and gave orders to give 1000 BP meds now. Nursing staff will continue to monitor. Lamonte Richer, RN

## 2015-05-28 NOTE — Care Management Important Message (Signed)
Important Message  Patient Details  Name: SAMIR LEYMAN MRN: [id number] Date of Birth: 05/22/1920   Medicare Important Message Given:  Yes    Eber Hong, RN 05/28/2015, 9:00 AM

## 2015-05-28 NOTE — Progress Notes (Signed)
[facility] Physicians - [place] at [facility]   PATIENT NAME: Anita Ward    MR#:  [id number]  DATE OF BIRTH:  Aug 21, 1919  SUBJECTIVE:  CHIEF COMPLAINT:   Chief Complaint  Patient presents with  . Chest Pain  BP creeping up, Threw up 4-5 times at lunch time, wasn't feeling well. Family at bedside. REVIEW OF SYSTEMS:  Review of Systems  Constitutional: Negative for fever, weight loss, malaise/fatigue and diaphoresis.  HENT: Negative for ear discharge, ear pain, hearing loss, nosebleeds, sore throat and tinnitus.   Eyes: Negative for blurred vision and pain.  Respiratory: Negative for cough, hemoptysis, shortness of breath and wheezing.   Cardiovascular: Negative for chest pain, palpitations, orthopnea and leg swelling.  Gastrointestinal: Positive for nausea and vomiting. Negative for heartburn, abdominal pain, diarrhea, constipation and blood in stool.  Genitourinary: Negative for dysuria, urgency and frequency.  Musculoskeletal: Negative for myalgias and back pain.  Skin: Negative for itching and rash.  Neurological: Negative for dizziness, tingling, tremors, focal weakness, seizures, weakness and headaches.  Psychiatric/Behavioral: Negative for depression. The patient is not nervous/anxious.     DRUG ALLERGIES:   Allergies  Allergen Reactions  . Benicar [Olmesartan] Other (See Comments)    Reaction:  Unknown   . Cardura [Doxazosin Mesylate] Other (See Comments)    Reaction:  Unknown   . Ciprofloxacin Other (See Comments)    Reaction:  Unknown  . Fosamax [Alendronate Sodium] Other (See Comments)    Reaction:  Unknown  . Lisinopril Other (See Comments)    Reaction:  Unknown   . Norvasc [Amlodipine Besylate] Other (See Comments)    Reaction:  Unknown   . Penicillins Other (See Comments)    Reaction:  Unknown   . Sulfa Antibiotics Other (See Comments)    Reaction:  Unknown   . Torsemide Anxiety   VITALS:  Blood pressure 188/64, pulse 67, temperature 98.2  F (36.8 C), temperature source Oral, resp. rate 18, height 5\' 2"  (1.575 m), weight 57.743 kg (127 lb 4.8 oz), SpO2 97 %. PHYSICAL EXAMINATION:  Physical Exam  Constitutional: She is oriented to person, place, and time and well-developed, well-nourished, and in no distress.  HENT:  Head: Normocephalic and atraumatic.  Eyes: Conjunctivae and EOM are normal. Pupils are equal, round, and reactive to light.  Neck: Normal range of motion. Neck supple. No tracheal deviation present. No thyromegaly present.  Cardiovascular: Normal rate, regular rhythm and normal heart sounds.   Pulmonary/Chest: Effort normal and breath sounds normal. No respiratory distress. She has no wheezes. She exhibits no tenderness.  Abdominal: Soft. Bowel sounds are normal. She exhibits no distension. There is no tenderness.  Musculoskeletal: Normal range of motion.  Neurological: She is alert and oriented to person, place, and time. No cranial nerve deficit.  Skin: Skin is warm and dry. No rash noted.  Psychiatric: Mood and affect normal.   LABORATORY PANEL:   CBC  Recent Labs Lab 05/28/15 0357  WBC 13.0*  HGB 12.2  HCT 36.8  PLT 363   ------------------------------------------------------------------------------------------------------------------ Chemistries   Recent Labs Lab 05/26/15 2207  NA 134*  K 3.3*  CL 101  CO2 27  GLUCOSE 115*  BUN 10  CREATININE 0.92  CALCIUM 8.7*  AST 22  ALT 10*  ALKPHOS 56  BILITOT 0.6   RADIOLOGY:  No results found. ASSESSMENT AND PLAN:  This is a 81 year old Caucasian female with coronary artery disease status post CABG admitted for NSTEMI.  1. Myocardial infarction: NSTEMI as evidenced by  new onset left bundle branch block. Neg cardiac enzymes. Cardiology recommends conservative mgmt. off heparin drip   2. Coronary artery disease: Continue aspirin and Plavix as well 3. Essential hypertension: Continue losartan and clonidine. Heart rate is well controlled 4.  Hypothyroidism: increased dose of Synthroid as TSH up 5. Osteoporosis: Continue vitamin D and calcium supplementation 6. Hyperlipidemia: Continue simvastatin 7. Depression: Continue sertraline 8. N/V: unknown etio, monitor, could be due to gas or gastroenteritis or meds side effect, consider Abd [place] if continues to have same. 9. UTI: based on UA, started on keflex - can be stopped after total 3 days (tomorrow)     All the records are reviewed and case discussed with Care Management/Social Workerr. Management plans discussed with the patient, family and they are in agreement.  CODE STATUS: Full Code  TOTAL TIME TAKING CARE OF THIS PATIENT: 35 minutes.   More than 50% of the time was spent in counseling/coordination of care: YES  POSSIBLE D/C IN 1-2 DAYS, DEPENDING ON CLINICAL CONDITION.   [facility], Axell Trigueros M.D on 05/28/2015 at 3:50 PM  Between 7am to 6pm - Pager - [phone]  After 6pm go to [email or web address] - password [email or web address] [facility]  [place] [facility]  Office  [phone]  CC:  Primary care physician; Barbette Reichmann, MD

## 2015-05-28 NOTE — Progress Notes (Signed)
Notified Dr. Sherryll Burger that blood pressure is still in the high 170's, which is where it has been all day. Patient has received PO hydralazine twice and lisinopril. MD stated to go ahead and give night time clonidine dose since patient took her AM dose early today. Will give and follow up.

## 2015-05-28 NOTE — Progress Notes (Signed)
Notified Dr. Sherryll Burger and [facility] that patient's blood pressure is still running somewhat high in the 160's after taking PO hydralazine. New orders have been placed. Will give lisinopril and continue to assess.

## 2015-05-28 NOTE — Progress Notes (Signed)
Patient's BP has still been elevated. After night shift gave morning doses of clonidine and losartan, BP is still 185/55. Notified Dr. Juliann Pares since daily meds and PRN hydralazine are not available to give. MD stated to give hydralazine PO tid and amlodipine bid. After talking with MD realized patient has an allergy to norvasc, will notify MD that cannot be used. Will give PO hydralazine for now.

## 2015-05-28 NOTE — Care Management (Signed)
Patient presents from home with chest pain.  PCP- Dr Marcello Fennel and is current with her appointments.  Lives alone , has life alert, son checks in with her, [employer] provides housekeeping services and her friend takes her on errands.  Denies problems accessing medical care and obtaining meds.  Patient is independent at home and has not been out of bed since admitted.  Discussed during progression of need  to prevent decline in functional status.to ambulate patient

## 2015-05-28 NOTE — Care Management (Signed)
Per primary nurse, patient is ambulatory and no problems.  Anticipate discharge today and have not identified any discharge needs

## 2015-05-28 NOTE — Progress Notes (Signed)
Rechecked patient's BP at 0002. BP 172/64, HR 74. Nursing staff will continue to monitor. Lamonte Richer, RN

## 2015-05-29 LAB — CBC
HEMATOCRIT: 35.7 % (ref 35.0–47.0)
Hemoglobin: 11.7 g/dL — ABNORMAL LOW (ref 12.0–16.0)
MCH: 25.5 pg — AB (ref 26.0–34.0)
MCHC: 32.7 g/dL (ref 32.0–36.0)
MCV: 77.9 fL — AB (ref 80.0–100.0)
PLATELETS: 356 10*3/uL (ref 150–440)
RBC: 4.58 MIL/uL (ref 3.80–5.20)
RDW: 15.9 % — AB (ref 11.5–14.5)
WBC: 14.7 10*3/uL — AB (ref 3.6–11.0)

## 2015-05-29 LAB — BASIC METABOLIC PANEL
ANION GAP: 8 (ref 5–15)
BUN: 13 mg/dL (ref 6–20)
CALCIUM: 8.9 mg/dL (ref 8.9–10.3)
CO2: 25 mmol/L (ref 22–32)
CREATININE: 0.97 mg/dL (ref 0.44–1.00)
Chloride: 93 mmol/L — ABNORMAL LOW (ref 101–111)
GFR calc Af Amer: 57 mL/min — ABNORMAL LOW (ref >60)
GFR, EST NON AFRICAN AMERICAN: 49 mL/min — AB (ref >60)
GLUCOSE: 109 mg/dL — AB (ref 65–99)
Potassium: 3.7 mmol/L (ref 3.5–5.1)
Sodium: 126 mmol/L — ABNORMAL LOW (ref 135–145)

## 2015-05-29 MED ORDER — CLOPIDOGREL BISULFATE 75 MG PO TABS: 75.0000 mg | ORAL_TABLET | Freq: Every day | ORAL | Status: AC

## 2015-05-29 MED ORDER — ASPIRIN 81 MG PO TBEC: 81.0000 mg | DELAYED_RELEASE_TABLET | Freq: Every day | ORAL | Status: DC

## 2015-05-29 MED ORDER — HYDRALAZINE HCL 50 MG PO TABS: 50.0000 mg | ORAL_TABLET | Freq: Four times a day (QID) | ORAL | Status: DC

## 2015-05-29 MED ORDER — SIMVASTATIN 10 MG PO TABS
10.0000 mg | ORAL_TABLET | Freq: Every day | ORAL | Status: DC
Start: 2015-05-29 — End: 2019-01-15

## 2015-05-29 NOTE — Progress Notes (Signed)
Pt is a&o, VSS, NSR on tele with no complaints of pain or discomfort. Order to d/c pt to home, no home health needs. Discharge instructions given to pt with verbal acknowledgment of understanding. IV and tele removed and pt to be escorted off unit by nursing upon the arrival of grandson, whom has been called. Will continue to assess.

## 2015-05-29 NOTE — Discharge Summary (Signed)
[facility] [place] at [facility]   PATIENT NAME: Anita Ward    MR#:  [id number]  DATE OF BIRTH:  01/16/1920  DATE OF ADMISSION:  05/26/2015 ADMITTING PHYSICIAN: Arnaldo Natal, MD  DATE OF DISCHARGE: 05/29/2015  PRIMARY CARE PHYSICIAN: Barbette Reichmann, MD    ADMISSION DIAGNOSIS:  Chest pain, unspecified chest pain type [R07.9]  DISCHARGE DIAGNOSIS:  Active Problems:   MI (myocardial infarction) (HCC)   SECONDARY DIAGNOSIS:   Past Medical History  Diagnosis Date  . HTN (hypertension)   . Coronary artery disease   . Glaucoma   . Osteoporosis     HOSPITAL COURSE:   This is a 81 year old Caucasian female with coronary artery disease status post CABG admitted for NSTEMI.  1. Myocardial infarction: NSTEMI as evidenced by new onset left bundle branch block. Neg cardiac enzymes. Cardiology recommends conservative mgmt. off heparin drip   advised to follow with cardiology clinic.  2. Coronary artery disease: Continue aspirin and Plavix, statin as well 3. Essential hypertension: Continue losartan and clonidine. Heart rate is well controlled. Added hydralazine. 4. Hypothyroidism: increased dose of Synthroid as TSH up 5. Osteoporosis: Continue vitamin D and calcium supplementation 6. Hyperlipidemia: Continue simvastatin 7. Depression: Continue sertraline 8. N/V: unknown etio, monitor, could be due to gas or gastroenteritis or meds side effect, pt feels fine today, tolerated her breakfast. 9. UTI: based on UA, started on keflex - can be stopped after total 3 days (tomorrow) 10. Hyponatremia     Pt is stable. Likely due to old age and meds.    Advised to folow with PMD in office next week. DISCHARGE CONDITIONS:   Stable.  CONSULTS OBTAINED:     DRUG ALLERGIES:   Allergies  Allergen Reactions  . Benicar [Olmesartan] Other (See Comments)    Reaction:  Unknown   . Cardura [Doxazosin Mesylate] Other (See Comments)    Reaction:  Unknown    . Ciprofloxacin Other (See Comments)    Reaction:  Unknown  . Fosamax [Alendronate Sodium] Other (See Comments)    Reaction:  Unknown  . Lisinopril Other (See Comments)    Reaction:  Unknown   . Norvasc [Amlodipine Besylate] Other (See Comments)    Reaction:  Unknown   . Penicillins Other (See Comments)    Reaction:  Unknown   . Sulfa Antibiotics Other (See Comments)    Reaction:  Unknown   . Torsemide Anxiety    DISCHARGE MEDICATIONS:   Current Discharge Medication List    START taking these medications   Details  aspirin EC 81 MG EC tablet Take 1 tablet (81 mg total) by mouth daily. Qty: 30 tablet, Refills: 0    clopidogrel (PLAVIX) 75 MG tablet Take 1 tablet (75 mg total) by mouth daily with breakfast. Qty: 30 tablet, Refills: 0    hydrALAZINE (APRESOLINE) 50 MG tablet Take 1 tablet (50 mg total) by mouth every 6 (six) hours. Qty: 80 tablet, Refills: 0    simvastatin (ZOCOR) 10 MG tablet Take 1 tablet (10 mg total) by mouth daily at 6 PM. Qty: 30 tablet, Refills: 0      CONTINUE these medications which have NOT CHANGED   Details  ALPRAZolam (XANAX) 0.5 MG tablet Take 1 tablet (0.5 mg total) by mouth 3 (three) times daily as needed for sleep or anxiety. Qty: 60 tablet, Refills: 0    Calcium Carbonate-Vitamin D (CALCIUM 600+D) 600-400 MG-UNIT tablet Take 1 tablet by mouth 2 (two) times daily.    cholecalciferol (VITAMIN  D) 1000 UNITS tablet Take 1,000 Units by mouth daily.    cloNIDine (CATAPRES) 0.2 MG tablet Take 0.2 mg by mouth 2 (two) times daily.    CRANBERRY PO Take 1 capsule by mouth daily.    levothyroxine (SYNTHROID, LEVOTHROID) 25 MCG tablet Take 25 mcg by mouth daily.     losartan (COZAAR) 50 MG tablet Take 50 mg by mouth daily.    Magnesium 100 MG TABS Take 100 mg by mouth 2 (two) times daily.    nitroGLYCERIN (NITROSTAT) 0.4 MG SL tablet Place 0.4 mg under the tongue every 5 (five) minutes as needed for chest pain.    potassium chloride (K-DUR)  10 MEQ tablet Take 10 mEq by mouth daily.    sertraline (ZOLOFT) 25 MG tablet Take 25 mg by mouth daily.    traMADol (ULTRAM) 50 MG tablet Take 50 mg by mouth 3 (three) times daily as needed for moderate pain.    vitamin B-12 (CYANOCOBALAMIN) 1000 MCG tablet Take 1,000 mcg by mouth daily.    feeding supplement, ENSURE ENLIVE, (ENSURE ENLIVE) LIQD Take 237 mLs by mouth 2 (two) times daily between meals. Qty: 237 mL, Refills: 12    metoprolol succinate (TOPROL-XL) 50 MG 24 hr tablet 1 tab po qd Qty: 30 tablet, Refills: 3      STOP taking these medications     furosemide (LASIX) 40 MG tablet          DISCHARGE INSTRUCTIONS:    Follow with PMD to check BMP next wek.  If you experience worsening of your admission symptoms, develop shortness of breath, life threatening emergency, suicidal or homicidal thoughts you must seek medical attention immediately by calling 911 or calling your MD immediately  if symptoms less severe.  You Must read complete instructions/literature along with all the possible adverse reactions/side effects for all the Medicines you take and that have been prescribed to you. Take any new Medicines after you have completely understood and accept all the possible adverse reactions/side effects.   Please note  You were cared for by a hospitalist during your hospital stay. If you have any questions about your discharge medications or the care you received while you were in the hospital after you are discharged, you can call the unit and asked to speak with the hospitalist on call if the hospitalist that took care of you is not available. Once you are discharged, your primary care physician will handle any further medical issues. Please note that NO REFILLS for any discharge medications will be authorized once you are discharged, as it is imperative that you return to your primary care physician (or establish a relationship with a primary care physician if you do not have  one) for your aftercare needs so that they can reassess your need for medications and monitor your lab values.    Today   CHIEF COMPLAINT:   Chief Complaint  Patient presents with  . Chest Pain    HISTORY OF PRESENT ILLNESS:  Sameka Bagent  is a 81 y.o. female presents emergency department via EMS after an episode of chest pain that lasted more than an hour. The patient states that she felt a dull ache in her chest after she finished eating. She took her blood pressure and found the systolic reading to be greater than 200. She took an extra half clonidine tablet and felt somewhat better but her chest pain did not ease until after arrival in the emergency department where she was given aspirin. He  denies radiation of the pain but admits to some nausea. The patient denies diaphoresis. Oxygen saturations at the time of EMS arrival approximately 87% on room air but the patient denies shortness of breath at the time of our interview. EKG shows left bundle branch block which is a new rhythm. Notably there was some confusion on the part of EMS and/or family whether this rhythm was new. Review of EKGs in medical records shows no left bundle branch block and 3 previous rhythm strips obtained within the last month. Thus the patient was admitted for myocardial infarct.   VITAL SIGNS:  Blood pressure 157/52, pulse 76, temperature 98.8 F (37.1 C), temperature source Oral, resp. rate 17, height 5\' 2"  (1.575 m), weight 58.695 kg (129 lb 6.4 oz), SpO2 97 %.  I/O:   Intake/Output Summary (Last 24 hours) at 05/29/15 1059 Last data filed at 05/29/15 0900  Gross per 24 hour  Intake    360 ml  Output   1700 ml  Net  -1340 ml    PHYSICAL EXAMINATION:   Constitutional: She is oriented to person, place, and time and well-developed, well-nourished, and in no distress.  HENT:  Head: Normocephalic and atraumatic.  Eyes: Conjunctivae and EOM are normal. Pupils are equal, round, and reactive to light.   Neck: Normal range of motion. Neck supple. No tracheal deviation present. No thyromegaly present.  Cardiovascular: Normal rate, regular rhythm and normal heart sounds.  Pulmonary/Chest: Effort normal and breath sounds normal. No respiratory distress. She has no wheezes. She exhibits no tenderness.  Abdominal: Soft. Bowel sounds are normal. She exhibits no distension. There is no tenderness.  Musculoskeletal: Normal range of motion.  Neurological: She is alert and oriented to person, place, and time. No cranial nerve deficit.  Skin: Skin is warm and dry. No rash noted.  Psychiatric: Mood and affect normal.    DATA REVIEW:   CBC  Recent Labs Lab 05/29/15 0411  WBC 14.7*  HGB 11.7*  HCT 35.7  PLT 356    Chemistries   Recent Labs Lab 05/26/15 2207 05/29/15 0411  NA 134* 126*  K 3.3* 3.7  CL 101 93*  CO2 27 25  GLUCOSE 115* 109*  BUN 10 13  CREATININE 0.92 0.97  CALCIUM 8.7* 8.9  AST 22  --   ALT 10*  --   ALKPHOS 56  --   BILITOT 0.6  --     Cardiac Enzymes  Recent Labs Lab 05/27/15 1305  TROPONINI 0.11*    Microbiology Results  Results for orders placed or performed during the hospital encounter of 03/12/15  Blood culture (routine x 2)     Status: None   Collection Time: 03/12/15  3:34 PM  Result Value Ref Range Status   Specimen Description BLOOD LEFT ASSIST CONTROL  Final   Special Requests BOTTLES DRAWN AEROBIC AND ANAEROBIC  1CC  Final   Culture  Setup Time   Final    GRAM NEGATIVE RODS IN BOTH AEROBIC AND ANAEROBIC BOTTLES CRITICAL VALUE NOTED.  VALUE IS CONSISTENT WITH PREVIOUSLY REPORTED AND CALLED VALUE. PREVIOUSLY CALLED AT 0418 03/13/15.PMH CONFIRMED BY RW    Culture   Final    ESCHERICHIA COLI IN BOTH AEROBIC AND ANAEROBIC BOTTLES    Report Status 03/15/2015 FINAL  Final   Organism ID, Bacteria ESCHERICHIA COLI  Final      Susceptibility   Escherichia coli - MIC*    AMPICILLIN <=2 SENSITIVE Sensitive     CEFTAZIDIME <=1 SENSITIVE  Sensitive  CEFAZOLIN <=4 SENSITIVE Sensitive     CEFTRIAXONE <=1 SENSITIVE Sensitive     CIPROFLOXACIN <=0.25 SENSITIVE Sensitive     GENTAMICIN <=1 SENSITIVE Sensitive     IMIPENEM <=0.25 SENSITIVE Sensitive     TRIMETH/SULFA <=20 SENSITIVE Sensitive     PIP/TAZO Value in next row Sensitive      SENSITIVE<=4    AMPICILLIN/SULBACTAM Value in next row Sensitive      SENSITIVE<=2    ERTAPENEM Value in next row Sensitive      SENSITIVE<=0.5    * ESCHERICHIA COLI  Blood culture (routine x 2)     Status: None   Collection Time: 03/12/15  3:34 PM  Result Value Ref Range Status   Specimen Description BLOOD RIGHT ASSIST CONTROL  Final   Special Requests   Final    BOTTLES DRAWN AEROBIC AND ANAEROBIC  AER 4CC ANA 3CC   Culture  Setup Time   Final    GRAM NEGATIVE RODS IN BOTH AEROBIC AND ANAEROBIC BOTTLES CRITICAL RESULT CALLED TO, READ BACK BY AND VERIFIED WITH: [facility] AT 1610 03/13/15.PMH CONFIRMED BY RW    Culture   Final    ESCHERICHIA COLI IN BOTH AEROBIC AND ANAEROBIC BOTTLES    Report Status 03/15/2015 FINAL  Final   Organism ID, Bacteria ESCHERICHIA COLI  Final      Susceptibility   Escherichia coli - MIC*    AMPICILLIN <=2 SENSITIVE Sensitive     CEFTAZIDIME <=1 SENSITIVE Sensitive     CEFAZOLIN <=4 SENSITIVE Sensitive     CEFTRIAXONE <=1 SENSITIVE Sensitive     CIPROFLOXACIN <=0.25 SENSITIVE Sensitive     GENTAMICIN <=1 SENSITIVE Sensitive     IMIPENEM <=0.25 SENSITIVE Sensitive     TRIMETH/SULFA <=20 SENSITIVE Sensitive     PIP/TAZO Value in next row Sensitive      SENSITIVE<=4    AMPICILLIN/SULBACTAM Value in next row Sensitive      SENSITIVE<=2    ERTAPENEM Value in next row Sensitive      SENSITIVE<=0.5    * ESCHERICHIA COLI  Urine culture     Status: None   Collection Time: 03/12/15  4:33 PM  Result Value Ref Range Status   Specimen Description URINE, RANDOM  Final   Special Requests NONE  Final   Culture >=100,000 COLONIES/mL ESCHERICHIA COLI   Final   Report Status 03/14/2015 FINAL  Final   Organism ID, Bacteria ESCHERICHIA COLI  Final      Susceptibility   Escherichia coli - MIC*    AMPICILLIN <=2 SENSITIVE Sensitive     CEFTAZIDIME <=1 SENSITIVE Sensitive     CEFAZOLIN <=4 SENSITIVE Sensitive     CEFTRIAXONE <=1 SENSITIVE Sensitive     CIPROFLOXACIN <=0.25 SENSITIVE Sensitive     GENTAMICIN <=1 SENSITIVE Sensitive     IMIPENEM <=0.25 SENSITIVE Sensitive     TRIMETH/SULFA <=20 SENSITIVE Sensitive     PIP/TAZO Value in next row Sensitive      SENSITIVE<=4    * >=100,000 COLONIES/mL ESCHERICHIA COLI  C difficile quick scan w PCR reflex     Status: None   Collection Time: 03/15/15  6:36 PM  Result Value Ref Range Status   C Diff antigen NEGATIVE NEGATIVE Final   C Diff toxin NEGATIVE NEGATIVE Final   C Diff interpretation Negative for C. difficile  Final    RADIOLOGY:  No results found.    Management plans discussed with the patient, family and they are in agreement.  CODE STATUS:  Code Status Orders        Start     Ordered   05/27/15 0218  Full code   Continuous     05/27/15 0217    Advance Directive Documentation        Most Recent Value   Type of Advance Directive  Living will   Pre-existing out of facility DNR order (yellow form or pink MOST form)     "MOST" Form in Place?        TOTAL TIME TAKING CARE OF THIS PATIENT: 35 minutes.    Altamese Dilling M.D on 05/29/2015 at 10:59 AM  Between 7am to 6pm - Pager - [phone]  After 6pm go to [email or web address] - password [email or web address]  [facility]  Office  [phone]  CC: Primary care physician; Barbette Reichmann, MD   Note: This dictation was prepared with Dragon dictation along with smaller phrase technology. Any transcriptional errors that result from this process are unintentional.

## 2015-10-18 ENCOUNTER — Emergency Department
Admission: EM | Admit: 2015-10-18 | Discharge: 2015-10-19 | Disposition: A | Discharge status: Home / Self Care | Payer: Medicare Other | Attending: Emergency Medicine | Admitting: Emergency Medicine

## 2015-10-18 ENCOUNTER — Encounter: Payer: Self-pay | Admitting: Emergency Medicine

## 2015-10-18 DIAGNOSIS — H409 Unspecified glaucoma: Secondary | ICD-10-CM | POA: Diagnosis not present

## 2015-10-18 DIAGNOSIS — R51 Headache: Secondary | ICD-10-CM | POA: Diagnosis not present

## 2015-10-18 DIAGNOSIS — I110 Hypertensive heart disease with heart failure: Secondary | ICD-10-CM | POA: Insufficient documentation

## 2015-10-18 DIAGNOSIS — I509 Heart failure, unspecified: Secondary | ICD-10-CM | POA: Insufficient documentation

## 2015-10-18 DIAGNOSIS — I10 Essential (primary) hypertension: Secondary | ICD-10-CM

## 2015-10-18 DIAGNOSIS — I252 Old myocardial infarction: Secondary | ICD-10-CM | POA: Diagnosis not present

## 2015-10-18 DIAGNOSIS — Z79899 Other long term (current) drug therapy: Secondary | ICD-10-CM | POA: Diagnosis not present

## 2015-10-18 DIAGNOSIS — M810 Age-related osteoporosis without current pathological fracture: Secondary | ICD-10-CM | POA: Diagnosis not present

## 2015-10-18 DIAGNOSIS — I2510 Atherosclerotic heart disease of native coronary artery without angina pectoris: Secondary | ICD-10-CM | POA: Insufficient documentation

## 2015-10-18 DIAGNOSIS — Z7982 Long term (current) use of aspirin: Secondary | ICD-10-CM | POA: Insufficient documentation

## 2015-10-19 DIAGNOSIS — I110 Hypertensive heart disease with heart failure: Secondary | ICD-10-CM | POA: Diagnosis not present

## 2015-10-19 LAB — COMPREHENSIVE METABOLIC PANEL
ALK PHOS: 58 U/L (ref 38–126)
ALT: 12 U/L — AB (ref 14–54)
ANION GAP: 6 (ref 5–15)
AST: 22 U/L (ref 15–41)
Albumin: 3.2 g/dL — ABNORMAL LOW (ref 3.5–5.0)
BUN: 21 mg/dL — ABNORMAL HIGH (ref 6–20)
CALCIUM: 8.9 mg/dL (ref 8.9–10.3)
CO2: 29 mmol/L (ref 22–32)
CREATININE: 0.97 mg/dL (ref 0.44–1.00)
Chloride: 100 mmol/L — ABNORMAL LOW (ref 101–111)
GFR calc non Af Amer: 48 mL/min — ABNORMAL LOW (ref >60)
GFR, EST AFRICAN AMERICAN: 56 mL/min — AB (ref >60)
GLUCOSE: 116 mg/dL — AB (ref 65–99)
Potassium: 3.3 mmol/L — ABNORMAL LOW (ref 3.5–5.1)
Sodium: 135 mmol/L (ref 135–145)
TOTAL PROTEIN: 7.1 g/dL (ref 6.5–8.1)
Total Bilirubin: 0.5 mg/dL (ref 0.3–1.2)

## 2015-10-19 LAB — CBC
HEMATOCRIT: 33.4 % — AB (ref 35.0–47.0)
Hemoglobin: 11.2 g/dL — ABNORMAL LOW (ref 12.0–16.0)
MCH: 25.2 pg — AB (ref 26.0–34.0)
MCHC: 33.6 g/dL (ref 32.0–36.0)
MCV: 75 fL — ABNORMAL LOW (ref 80.0–100.0)
Platelets: 326 10*3/uL (ref 150–440)
RBC: 4.45 MIL/uL (ref 3.80–5.20)
RDW: 16.8 % — ABNORMAL HIGH (ref 11.5–14.5)
WBC: 9.6 10*3/uL (ref 3.6–11.0)

## 2015-10-19 LAB — TROPONIN I: Troponin I: <0.03 ng/mL (ref <0.031)

## 2015-10-19 NOTE — ED Notes (Signed)
Pt presents to ED via EMS to be evaluated for HTN and slight headache. Pt states initial BP-253/107, then took extra dose of clonidine and called EMS. EMS reports BP-172/102, then 214/108 en route. Pt denies headache now and BP-153/61.

## 2015-10-19 NOTE — Discharge Instructions (Signed)
Hypertension Hypertension, commonly called high blood pressure, is when the force of blood pumping through your arteries is too strong. Your arteries are the blood vessels that carry blood from your heart throughout your body. A blood pressure reading consists of a higher number over a lower number, such as 110/72. The higher number (systolic) is the pressure inside your arteries when your heart pumps. The lower number (diastolic) is the pressure inside your arteries when your heart relaxes. Ideally you want your blood pressure below 120/80. Hypertension forces your heart to work harder to pump blood. Your arteries may become narrow or stiff. Having untreated or uncontrolled hypertension can cause heart attack, stroke, kidney disease, and other problems. RISK FACTORS Some risk factors for high blood pressure are controllable. Others are not.  Risk factors you cannot control include:   Race. You may be at higher risk if you are African American.  Age. Risk increases with age.  Gender. Men are at higher risk than women before age 45 years. After age 65, women are at higher risk than men. Risk factors you can control include:  Not getting enough exercise or physical activity.  Being overweight.  Getting too much fat, sugar, calories, or salt in your diet.  Drinking too much alcohol. SIGNS AND SYMPTOMS Hypertension does not usually cause signs or symptoms. Extremely high blood pressure (hypertensive crisis) may cause headache, anxiety, shortness of breath, and nosebleed. DIAGNOSIS To check if you have hypertension, your health care provider will measure your blood pressure while you are seated, with your arm held at the level of your heart. It should be measured at least twice using the same arm. Certain conditions can cause a difference in blood pressure between your right and left arms. A blood pressure reading that is higher than normal on one occasion does not mean that you need treatment. If  it is not clear whether you have high blood pressure, you may be asked to return on a different day to have your blood pressure checked again. Or, you may be asked to monitor your blood pressure at home for 1 or more weeks. TREATMENT Treating high blood pressure includes making lifestyle changes and possibly taking medicine. Living a healthy lifestyle can help lower high blood pressure. You may need to change some of your habits. Lifestyle changes may include:  Following the DASH diet. This diet is high in fruits, vegetables, and whole grains. It is low in salt, red meat, and added sugars.  Keep your sodium intake below 2,300 mg per day.  Getting at least 30-45 minutes of aerobic exercise at least 4 times per week.  Losing weight if necessary.  Not smoking.  Limiting alcoholic beverages.  Learning ways to reduce stress. Your health care provider may prescribe medicine if lifestyle changes are not enough to get your blood pressure under control, and if one of the following is true:  You are 18-59 years of age and your systolic blood pressure is above 140.  You are 60 years of age or older, and your systolic blood pressure is above 150.  Your diastolic blood pressure is above 90.  You have diabetes, and your systolic blood pressure is over 140 or your diastolic blood pressure is over 90.  You have kidney disease and your blood pressure is above 140/90.  You have heart disease and your blood pressure is above 140/90. Your personal target blood pressure may vary depending on your medical conditions, your age, and other factors. HOME CARE INSTRUCTIONS    Have your blood pressure rechecked as directed by your health care provider.   Take medicines only as directed by your health care provider. Follow the directions carefully. Blood pressure medicines must be taken as prescribed. The medicine does not work as well when you skip doses. Skipping doses also puts you at risk for  problems.  Do not smoke.   Monitor your blood pressure at home as directed by your health care provider. SEEK MEDICAL CARE IF:   You think you are having a reaction to medicines taken.  You have recurrent headaches or feel dizzy.  You have swelling in your ankles.  You have trouble with your vision. SEEK IMMEDIATE MEDICAL CARE IF:  You develop a severe headache or confusion.  You have unusual weakness, numbness, or feel faint.  You have severe chest or abdominal pain.  You vomit repeatedly.  You have trouble breathing. MAKE SURE YOU:   Understand these instructions.  Will watch your condition.  Will get help right away if you are not doing well or get worse.   This information is not intended to replace advice given to you by your health care provider. Make sure you discuss any questions you have with your health care provider.   Document Released: 06/26/2005 Document Revised: 11/10/2014 Document Reviewed: 04/18/2013 Elsevier Interactive Patient Education 2016 [employer]  

## 2015-10-21 NOTE — ED Provider Notes (Signed)
[facility] Emergency Department Provider Note  ____________________________________________  Time seen: 12:10 AM  I have reviewed the triage vital signs and the nursing notes.   HISTORY  Chief Complaint Hypertension and Headache      HPI Anita Ward is a 82 y.o. female with history of hypertension, coronary artery disease presents to emergency department via EMS with hypertension and "slight headache which is since resolved. Patient states her BP at home was 253/107 and a such she took an extra dose of clonidine and subsequent called EMS. Per EMS on their arrival BP 172/102 and then 214/108 while in route to the hospital. On arrival to the emergency department the patient's blood pressure 153/61. Patient denies any chest pain no shortness of breath no headache at this time. Patient denies any visual changes weakness numbness or gait instability.     Past Medical History  Diagnosis Date  . HTN (hypertension)   . Coronary artery disease   . Glaucoma   . Osteoporosis     Patient Active Problem List   Diagnosis Date Noted  . MI (myocardial infarction) (HCC) 05/27/2015  . CHF (congestive heart failure) (HCC)   . UTI (urinary tract infection) 03/12/2015  . UTI (lower urinary tract infection) 03/12/2015    Past Surgical History  Procedure Laterality Date  . Cardiac surgery    . Cardiac surgery    . Coronary artery bypass graft      Current Outpatient Rx  Name  Route  Sig  Dispense  Refill  . ALPRAZolam (XANAX) 0.5 MG tablet   Oral   Take 1 tablet (0.5 mg total) by mouth 3 (three) times daily as needed for sleep or anxiety.   60 tablet   0   . aspirin EC 81 MG EC tablet   Oral   Take 1 tablet (81 mg total) by mouth daily.   30 tablet   0   . Calcium Carbonate-Vitamin D (CALCIUM 600+D) 600-400 MG-UNIT tablet   Oral   Take 1 tablet by mouth 2 (two) times daily.         . cholecalciferol (VITAMIN D) 1000 UNITS tablet   Oral   Take  1,000 Units by mouth daily.         . cloNIDine (CATAPRES) 0.2 MG tablet   Oral   Take 0.2 mg by mouth 2 (two) times daily.         . clopidogrel (PLAVIX) 75 MG tablet   Oral   Take 1 tablet (75 mg total) by mouth daily with breakfast.   30 tablet   0   . CRANBERRY PO   Oral   Take 1 capsule by mouth daily.         . feeding supplement, ENSURE ENLIVE, (ENSURE ENLIVE) LIQD   Oral   Take 237 mLs by mouth 2 (two) times daily between meals. Patient not taking: Reported on 05/26/2015   237 mL   12   . hydrALAZINE (APRESOLINE) 50 MG tablet   Oral   Take 1 tablet (50 mg total) by mouth every 6 (six) hours.   80 tablet   0   . levothyroxine (SYNTHROID, LEVOTHROID) 25 MCG tablet   Oral   Take 25 mcg by mouth daily.          Marland Kitchen losartan (COZAAR) 50 MG tablet   Oral   Take 50 mg by mouth daily.         . Magnesium 100 MG TABS  Oral   Take 100 mg by mouth 2 (two) times daily.         . metoprolol succinate (TOPROL-XL) 50 MG 24 hr tablet      1 tab po qd Patient not taking: Reported on 05/26/2015   30 tablet   3   . nitroGLYCERIN (NITROSTAT) 0.4 MG SL tablet   Sublingual   Place 0.4 mg under the tongue every 5 (five) minutes as needed for chest pain.         . potassium chloride (K-DUR) 10 MEQ tablet   Oral   Take 10 mEq by mouth daily.         . sertraline (ZOLOFT) 25 MG tablet   Oral   Take 25 mg by mouth daily.         . simvastatin (ZOCOR) 10 MG tablet   Oral   Take 1 tablet (10 mg total) by mouth daily at 6 PM.   30 tablet   0   . traMADol (ULTRAM) 50 MG tablet   Oral   Take 50 mg by mouth 3 (three) times daily as needed for moderate pain.         . vitamin B-12 (CYANOCOBALAMIN) 1000 MCG tablet   Oral   Take 1,000 mcg by mouth daily.           Allergies Benicar; Cardura; Ciprofloxacin; Fosamax; Lisinopril; Norvasc; Penicillins; Sulfa antibiotics; and Torsemide  Family History  Problem Relation Age of Onset  . Congestive  Heart Failure Father   . CAD Brother     Social History Social History  Substance Use Topics  . Smoking status: Never Smoker   . Smokeless tobacco: None  . Alcohol Use: No    Review of Systems  Constitutional: Negative for fever. Eyes: Negative for visual changes. ENT: Negative for sore throat. Cardiovascular: Negative for chest pain. Respiratory: Negative for shortness of breath. Gastrointestinal: Negative for abdominal pain, vomiting and diarrhea. Genitourinary: Negative for dysuria. Musculoskeletal: Negative for back pain. Skin: Negative for rash. Neurological: Negative for headaches, focal weakness or numbness.   10-point ROS otherwise negative.  ____________________________________________   PHYSICAL EXAM:  VITAL SIGNS: ED Triage Vitals  Enc Vitals Group     BP 10/19/15 0000 153/61 mmHg     Pulse Rate 10/19/15 0000 58     Resp 10/19/15 0000 10     Temp 10/19/15 0001 97.4 F (36.3 C)     Temp Source 10/19/15 0001 Oral     SpO2 10/19/15 0000 95 %     Weight 10/18/15 2351 126 lb (57.153 kg)     Height 10/18/15 2351 5\' 2"  (1.575 m)     Head Cir --      Peak Flow --      Pain Score 10/18/15 2343 0     Pain Loc --      Pain Edu? --      Excl. in GC? --      Constitutional: Alert and oriented. Well appearing and in no distress. Eyes: Conjunctivae are normal. PERRL. Normal extraocular movements. ENT   Head: Normocephalic and atraumatic.   Nose: No congestion/rhinnorhea.   Mouth/Throat: Mucous membranes are moist.   Neck: No stridor. Hematological/Lymphatic/Immunilogical: No cervical lymphadenopathy. Cardiovascular: Normal rate, regular rhythm. Normal and symmetric distal pulses are present in all extremities. No murmurs, rubs, or gallops. Respiratory: Normal respiratory effort without tachypnea nor retractions. Breath sounds are clear and equal bilaterally. No wheezes/rales/rhonchi. Gastrointestinal: Soft and nontender. No distention. There is  no CVA tenderness. Genitourinary: deferred Musculoskeletal: Nontender with normal range of motion in all extremities. No joint effusions.  No lower extremity tenderness nor edema. Neurologic:  Normal speech and language. No gross focal neurologic deficits are appreciated. Speech is normal.  Skin:  Skin is warm, dry and intact. No rash noted. Psychiatric: Mood and affect are normal. Speech and behavior are normal. Patient exhibits appropriate insight and judgment.  ____________________________________________    LABS (pertinent positives/negatives)  Labs Reviewed  CBC - Abnormal; Notable for the following:    Hemoglobin 11.2 (*)    HCT 33.4 (*)    MCV 75.0 (*)    MCH 25.2 (*)    RDW 16.8 (*)    All other components within normal limits  COMPREHENSIVE METABOLIC PANEL - Abnormal; Notable for the following:    Potassium 3.3 (*)    Chloride 100 (*)    Glucose, Bld 116 (*)    BUN 21 (*)    Albumin 3.2 (*)    ALT 12 (*)    GFR calc non Af Amer 48 (*)    GFR calc Af Amer 56 (*)    All other components within normal limits  TROPONIN I      _   INITIAL IMPRESSION / ASSESSMENT AND PLAN / ED COURSE  Pertinent labs & imaging results that were available during my care of the patient were reviewed by me and considered in my medical decision making (see chart for details).    ____________________________________________   FINAL CLINICAL IMPRESSION(S) / ED DIAGNOSES  Final diagnoses:  Essential hypertension      Darci Current, MD 10/21/15 2244

## 2015-11-06 ENCOUNTER — Emergency Department: Payer: Medicare Other

## 2015-11-06 ENCOUNTER — Emergency Department
Admission: EM | Admit: 2015-11-06 | Discharge: 2015-11-06 | Disposition: A | Discharge status: Home / Self Care | Payer: Medicare Other | Attending: Emergency Medicine | Admitting: Emergency Medicine

## 2015-11-06 DIAGNOSIS — I10 Essential (primary) hypertension: Secondary | ICD-10-CM | POA: Diagnosis present

## 2015-11-06 DIAGNOSIS — R0789 Other chest pain: Secondary | ICD-10-CM | POA: Insufficient documentation

## 2015-11-06 DIAGNOSIS — M810 Age-related osteoporosis without current pathological fracture: Secondary | ICD-10-CM | POA: Insufficient documentation

## 2015-11-06 DIAGNOSIS — Z79899 Other long term (current) drug therapy: Secondary | ICD-10-CM | POA: Insufficient documentation

## 2015-11-06 DIAGNOSIS — R079 Chest pain, unspecified: Secondary | ICD-10-CM

## 2015-11-06 DIAGNOSIS — F410 Panic disorder [episodic paroxysmal anxiety] without agoraphobia: Secondary | ICD-10-CM

## 2015-11-06 LAB — CBC
HEMATOCRIT: 34.1 % — AB (ref 35.0–47.0)
HEMOGLOBIN: 11.3 g/dL — AB (ref 12.0–16.0)
MCH: 25.2 pg — AB (ref 26.0–34.0)
MCHC: 33.2 g/dL (ref 32.0–36.0)
MCV: 75.9 fL — AB (ref 80.0–100.0)
Platelets: 271 10*3/uL (ref 150–440)
RBC: 4.5 MIL/uL (ref 3.80–5.20)
RDW: 16.4 % — AB (ref 11.5–14.5)
WBC: 10.1 10*3/uL (ref 3.6–11.0)

## 2015-11-06 LAB — COMPREHENSIVE METABOLIC PANEL
ALK PHOS: 53 U/L (ref 38–126)
ALT: 11 U/L — AB (ref 14–54)
ANION GAP: 8 (ref 5–15)
AST: 17 U/L (ref 15–41)
Albumin: 2.8 g/dL — ABNORMAL LOW (ref 3.5–5.0)
BILIRUBIN TOTAL: 0.6 mg/dL (ref 0.3–1.2)
BUN: 19 mg/dL (ref 6–20)
CALCIUM: 7.8 mg/dL — AB (ref 8.9–10.3)
CO2: 27 mmol/L (ref 22–32)
CREATININE: 0.96 mg/dL (ref 0.44–1.00)
Chloride: 104 mmol/L (ref 101–111)
GFR, EST AFRICAN AMERICAN: 57 mL/min — AB (ref >60)
GFR, EST NON AFRICAN AMERICAN: 49 mL/min — AB (ref >60)
Glucose, Bld: 105 mg/dL — ABNORMAL HIGH (ref 65–99)
Potassium: 2.7 mmol/L — CL (ref 3.5–5.1)
Sodium: 139 mmol/L (ref 135–145)
TOTAL PROTEIN: 6 g/dL — AB (ref 6.5–8.1)

## 2015-11-06 LAB — TROPONIN I
Troponin I: <0.03 ng/mL (ref <0.031)
Troponin I: <0.03 ng/mL (ref <0.031)

## 2015-11-06 MED ORDER — POTASSIUM CHLORIDE CRYS ER 20 MEQ PO TBCR
40.0000 meq | EXTENDED_RELEASE_TABLET | Freq: Once | ORAL | Status: AC
  Administered 2015-11-06: 40 meq via ORAL
  Filled 2015-11-06: qty 2

## 2015-11-06 NOTE — Discharge Instructions (Signed)
Nonspecific Chest Pain °It is often hard to find the cause of chest pain. There is always a chance that your pain could be related to something serious, such as a heart attack or a blood clot in your lungs. Chest pain can also be caused by conditions that are not life-threatening. If you have chest pain, it is very important to follow up with your doctor. ° °HOME CARE °· If you were prescribed an antibiotic medicine, finish it all even if you start to feel better. °· Avoid any activities that cause chest pain. °· Do not use any tobacco products, including cigarettes, chewing tobacco, or electronic cigarettes. If you need help quitting, ask your doctor. °· Do not drink alcohol. °· Take medicines only as told by your doctor. °· Keep all follow-up visits as told by your doctor. This is important. This includes any further testing if your chest pain does not go away. °· Your doctor may tell you to keep your head raised (elevated) while you sleep. °· Make lifestyle changes as told by your doctor. These may include: °· Getting regular exercise. Ask your doctor to suggest some activities that are safe for you. °· Eating a heart-healthy diet. Your doctor or a diet specialist (dietitian) can help you to learn healthy eating options. °· Maintaining a healthy weight. °· Managing diabetes, if necessary. °· Reducing stress. °GET HELP IF: °· Your chest pain does not go away, even after treatment. °· You have a rash with blisters on your chest. °· You have a fever. °GET HELP RIGHT AWAY IF: °· Your chest pain is worse. °· You have an increasing cough, or you cough up blood. °· You have severe belly (abdominal) pain. °· You feel extremely weak. °· You pass out (faint). °· You have chills. °· You have sudden, unexplained chest discomfort. °· You have sudden, unexplained discomfort in your arms, back, neck, or jaw. °· You have shortness of breath at any time. °· You suddenly start to sweat, or your skin gets clammy. °· You feel  nauseous. °· You vomit. °· You suddenly feel light-headed or dizzy. °· Your heart begins to beat quickly, or it feels like it is skipping beats. °These symptoms may be an emergency. Do not wait to see if the symptoms will go away. Get medical help right away. Call your local emergency services (911 in the [place] Do not drive yourself to the hospital. °  °This information is not intended to replace advice given to you by your health care provider. Make sure you discuss any questions you have with your health care provider. °  °Document Released: 12/13/2007 Document Revised: 07/17/2014 Document Reviewed: 01/30/2014 °Elsevier Interactive Patient Education ©2016 [employer] ° °Panic Attacks °Panic attacks are sudden, short-lived surges of severe anxiety, fear, or discomfort. They may occur for no reason when you are relaxed, when you are anxious, or when you are sleeping. Panic attacks may occur for a number of reasons:  °· Healthy people occasionally have panic attacks in extreme, life-threatening situations, such as war or natural disasters. Normal anxiety is a protective mechanism of the body that helps us react to danger (fight or flight response). °· Panic attacks are often seen with anxiety disorders, such as panic disorder, social anxiety disorder, generalized anxiety disorder, and phobias. Anxiety disorders cause excessive or uncontrollable anxiety. They may interfere with your relationships or other life activities. °· Panic attacks are sometimes seen with other mental illnesses, such as depression and posttraumatic stress disorder. °· Certain medical   conditions, prescription medicines, and drugs of abuse can cause panic attacks. °SYMPTOMS  °Panic attacks start suddenly, peak within 20 minutes, and are accompanied by four or more of the following symptoms: °· Pounding heart or fast heart rate (palpitations). °· Sweating. °· Trembling or shaking. °· Shortness of breath or feeling smothered. °· Feeling  choked. °· Chest pain or discomfort. °· Nausea or strange feeling in your stomach. °· Dizziness, light-headedness, or feeling like you will faint. °· Chills or hot flushes. °· Numbness or tingling in your lips or hands and feet. °· Feeling that things are not real or feeling that you are not yourself. °· Fear of losing control or going crazy. °· Fear of dying. °Some of these symptoms can mimic serious medical conditions. For example, you may think you are having a heart attack. Although panic attacks can be very scary, they are not life threatening. °DIAGNOSIS  °Panic attacks are diagnosed through an assessment by your health care provider. Your health care provider will ask questions about your symptoms, such as where and when they occurred. Your health care provider will also ask about your medical history and use of alcohol and drugs, including prescription medicines. Your health care provider may order blood tests or other studies to rule out a serious medical condition. Your health care provider may refer you to a mental health professional for further evaluation. °TREATMENT  °· Most healthy people who have one or two panic attacks in an extreme, life-threatening situation will not require treatment. °· The treatment for panic attacks associated with anxiety disorders or other mental illness typically involves counseling with a mental health professional, medicine, or a combination of both. Your health care provider will help determine what treatment is best for you. °· Panic attacks due to physical illness usually go away with treatment of the illness. If prescription medicine is causing panic attacks, talk with your health care provider about stopping the medicine, decreasing the dose, or substituting another medicine. °· Panic attacks due to alcohol or drug abuse go away with abstinence. Some adults need professional help in order to stop drinking or using drugs. °HOME CARE INSTRUCTIONS  °· Take all  medicines as directed by your health care provider.   °· Schedule and attend follow-up visits as directed by your health care provider. It is important to keep all your appointments. °SEEK MEDICAL CARE IF: °· You are not able to take your medicines as prescribed. °· Your symptoms do not improve or get worse. °SEEK IMMEDIATE MEDICAL CARE IF:  °· You experience panic attack symptoms that are different than your usual symptoms. °· You have serious thoughts about hurting yourself or others. °· You are taking medicine for panic attacks and have a serious side effect. °MAKE SURE YOU: °· Understand these instructions. °· Will watch your condition. °· Will get help right away if you are not doing well or get worse. °  °This information is not intended to replace advice given to you by your health care provider. Make sure you discuss any questions you have with your health care provider. °  °Document Released: 06/26/2005 Document Revised: 07/01/2013 Document Reviewed: 02/07/2013 °Elsevier Interactive Patient Education ©2016 [employer] ° °

## 2015-11-06 NOTE — ED Notes (Signed)
Pt came to ED via EMS from home. Pt reports elevated blood pressure and reports she was not feeling quite right. Pt denies headache, blurry vision. Reports some chest tightness.

## 2015-11-06 NOTE — ED Provider Notes (Signed)
[facility] Emergency Department Provider Note        Time seen: ----------------------------------------- 3:53 PM on 11/06/2015 -----------------------------------------    I have reviewed the triage vital signs and the nursing notes.   HISTORY  Chief Complaint Hypertension    HPI Anita Ward is a 82 y.o. female who presents ER for elevated blood pressure and not feeling well. Patient denies headache, blurry vision reports some chest tightness. Patient had taken 2 of her blood pressure medicines around noon and took an additional Xanax. Patient states she hasn't had a panic attack feel like this before. She denies any fevers, chills, cough or other complaints. Currently her symptoms have resolved.   Past Medical History  Diagnosis Date  . HTN (hypertension)   . Coronary artery disease   . Glaucoma   . Osteoporosis     Patient Active Problem List   Diagnosis Date Noted  . MI (myocardial infarction) (HCC) 05/27/2015  . CHF (congestive heart failure) (HCC)   . UTI (urinary tract infection) 03/12/2015  . UTI (lower urinary tract infection) 03/12/2015    Past Surgical History  Procedure Laterality Date  . Cardiac surgery    . Cardiac surgery    . Coronary artery bypass graft      Allergies Benicar; Cardura; Ciprofloxacin; Fosamax; Lisinopril; Norvasc; Penicillins; Sulfa antibiotics; and Torsemide  Social History Social History  Substance Use Topics  . Smoking status: Never Smoker   . Smokeless tobacco: None  . Alcohol Use: No    Review of Systems Constitutional: Negative for fever. Eyes: Negative for visual changes. ENT: Negative for sore throat. Cardiovascular: Positive for chest tightness Respiratory: Negative for shortness of breath. Gastrointestinal: Negative for abdominal pain, vomiting and diarrhea. Genitourinary: Negative for dysuria. Musculoskeletal: Negative for back pain. Skin: Negative for rash. Neurological:  Negative for headaches, focal weakness or numbness.  10-point ROS otherwise negative.  ____________________________________________   PHYSICAL EXAM:  VITAL SIGNS: ED Triage Vitals  Enc Vitals Group     BP --      Pulse --      Resp --      Temp --      Temp src --      SpO2 --      Weight --      Height --      Head Cir --      Peak Flow --      Pain Score --      Pain Loc --      Pain Edu? --      Excl. in GC? --     Constitutional: Alert and oriented. Mildly anxious, no acute distress Eyes: Conjunctivae are normal. PERRL. Normal extraocular movements. ENT   Head: Normocephalic and atraumatic.   Nose: No congestion/rhinnorhea.   Mouth/Throat: Mucous membranes are moist.   Neck: No stridor. Cardiovascular: Normal rate, regular rhythm. No murmurs, rubs, or gallops. Respiratory: Normal respiratory effort without tachypnea nor retractions. Breath sounds are clear and equal bilaterally. No wheezes/rales/rhonchi. Gastrointestinal: Soft and nontender. Normal bowel sounds Musculoskeletal: Nontender with normal range of motion in all extremities. No lower extremity tenderness nor edema. Neurologic:  Normal speech and language. No gross focal neurologic deficits are appreciated.  Skin:  Skin is warm, dry and intact. No rash noted. Psychiatric: Mood and affect are normal. Speech and behavior are normal.  ____________________________________________  EKG: Interpreted by me.Sinus rhythm with first-degree AV block, left bundle branch block, left axis deviation. Rate is 64 bpm  ____________________________________________  ED COURSE:  Pertinent labs & imaging results that were available during my care of the patient were reviewed by me and considered in my medical decision making (see chart for details). Patient presents to ER after some chest tightness. I we'll check cardiac labs and reevaluate. Currently her symptoms have  resolved. ____________________________________________    LABS (pertinent positives/negatives)  Labs Reviewed  CBC - Abnormal; Notable for the following:    Hemoglobin 11.3 (*)    HCT 34.1 (*)    MCV 75.9 (*)    MCH 25.2 (*)    RDW 16.4 (*)    All other components within normal limits  COMPREHENSIVE METABOLIC PANEL - Abnormal; Notable for the following:    Potassium 2.7 (*)    Glucose, Bld 105 (*)    Calcium 7.8 (*)    Total Protein 6.0 (*)    Albumin 2.8 (*)    ALT 11 (*)    GFR calc non Af Amer 49 (*)    GFR calc Af Amer 57 (*)    All other components within normal limits  TROPONIN I  TROPONIN I    RADIOLOGY  Chest x-ray IMPRESSION: 1. Slightly increased opacity at the right lung base, pneumonia versus asymmetric edema. In the absence of fever, I would favor mild edema related to CHF/volume overload. 2. Probable chronic interstitial lung disease. 3. Cardiomegaly, stable. ____________________________________________  FINAL ASSESSMENT AND PLAN  Chest pain  Plan: Patient with labs and imaging as dictated above. Patient with symptoms consistent with a panic attack. Her blood pressure is 168/61 without any treatment. She has been encouraged to change the timing of her anxiety medicine and to follow-up with her primary care doctor. She was given a one-time treatment of her low potassium here.   Emily Filbert, MD   Note: This dictation was prepared with Dragon dictation. Any transcriptional errors that result from this process are unintentional   Emily Filbert, MD 11/06/15 [phone]

## 2015-11-06 NOTE — ED Notes (Signed)
Potassium 2.7. MD notified  

## 2016-01-07 ENCOUNTER — Emergency Department
Admission: EM | Admit: 2016-01-07 | Discharge: 2016-01-07 | Disposition: A | Discharge status: Home / Self Care | Payer: Medicare Other | Attending: Emergency Medicine | Admitting: Emergency Medicine

## 2016-01-07 ENCOUNTER — Encounter: Payer: Self-pay | Admitting: Emergency Medicine

## 2016-01-07 ENCOUNTER — Emergency Department: Payer: Medicare Other

## 2016-01-07 DIAGNOSIS — M810 Age-related osteoporosis without current pathological fracture: Secondary | ICD-10-CM | POA: Insufficient documentation

## 2016-01-07 DIAGNOSIS — Z951 Presence of aortocoronary bypass graft: Secondary | ICD-10-CM | POA: Diagnosis not present

## 2016-01-07 DIAGNOSIS — I509 Heart failure, unspecified: Secondary | ICD-10-CM | POA: Insufficient documentation

## 2016-01-07 DIAGNOSIS — Z7982 Long term (current) use of aspirin: Secondary | ICD-10-CM | POA: Diagnosis not present

## 2016-01-07 DIAGNOSIS — I252 Old myocardial infarction: Secondary | ICD-10-CM | POA: Insufficient documentation

## 2016-01-07 DIAGNOSIS — I2510 Atherosclerotic heart disease of native coronary artery without angina pectoris: Secondary | ICD-10-CM | POA: Diagnosis not present

## 2016-01-07 DIAGNOSIS — Z7902 Long term (current) use of antithrombotics/antiplatelets: Secondary | ICD-10-CM | POA: Diagnosis not present

## 2016-01-07 DIAGNOSIS — R0789 Other chest pain: Secondary | ICD-10-CM | POA: Diagnosis not present

## 2016-01-07 DIAGNOSIS — Z79899 Other long term (current) drug therapy: Secondary | ICD-10-CM | POA: Diagnosis not present

## 2016-01-07 DIAGNOSIS — I110 Hypertensive heart disease with heart failure: Secondary | ICD-10-CM | POA: Insufficient documentation

## 2016-01-07 DIAGNOSIS — R079 Chest pain, unspecified: Secondary | ICD-10-CM

## 2016-01-07 LAB — COMPREHENSIVE METABOLIC PANEL
ALBUMIN: 3.4 g/dL — AB (ref 3.5–5.0)
ALK PHOS: 65 U/L (ref 38–126)
ALT: 15 U/L (ref 14–54)
ANION GAP: 8 (ref 5–15)
AST: 24 U/L (ref 15–41)
BILIRUBIN TOTAL: 0.6 mg/dL (ref 0.3–1.2)
BUN: 17 mg/dL (ref 6–20)
CALCIUM: 8.8 mg/dL — AB (ref 8.9–10.3)
CO2: 32 mmol/L (ref 22–32)
Chloride: 97 mmol/L — ABNORMAL LOW (ref 101–111)
Creatinine, Ser: 1 mg/dL (ref 0.44–1.00)
GFR calc Af Amer: 54 mL/min — ABNORMAL LOW (ref >60)
GFR calc non Af Amer: 47 mL/min — ABNORMAL LOW (ref >60)
GLUCOSE: 95 mg/dL (ref 65–99)
Potassium: 3.5 mmol/L (ref 3.5–5.1)
Sodium: 137 mmol/L (ref 135–145)
TOTAL PROTEIN: 7.3 g/dL (ref 6.5–8.1)

## 2016-01-07 LAB — CBC
HEMATOCRIT: 35.4 % (ref 35.0–47.0)
HEMOGLOBIN: 11.9 g/dL — AB (ref 12.0–16.0)
MCH: 26.1 pg (ref 26.0–34.0)
MCHC: 33.6 g/dL (ref 32.0–36.0)
MCV: 77.7 fL — ABNORMAL LOW (ref 80.0–100.0)
Platelets: 293 10*3/uL (ref 150–440)
RBC: 4.56 MIL/uL (ref 3.80–5.20)
RDW: 15.1 % — ABNORMAL HIGH (ref 11.5–14.5)
WBC: 9.7 10*3/uL (ref 3.6–11.0)

## 2016-01-07 LAB — TROPONIN I
Troponin I: <0.03 ng/mL (ref <0.03)
Troponin I: <0.03 ng/mL (ref <0.03)

## 2016-01-07 LAB — BRAIN NATRIURETIC PEPTIDE: B NATRIURETIC PEPTIDE 5: 448 pg/mL — AB (ref 0.0–100.0)

## 2016-01-07 NOTE — ED Notes (Signed)
Patient denies chest pain at this time, however states a mild "discomfort" 2/10, non radiating without associated symptoms. Patient stable, resting comfortably

## 2016-01-07 NOTE — ED Provider Notes (Signed)
[facility] Emergency Department Provider Note  ____________________________________________    I have reviewed the triage vital signs and the nursing notes.   HISTORY  Chief Complaint Chest Pain    HPI Anita Ward is a 82 y.o. female who presents with complaints of chest pain. Patient reports last night she had chest discomfort and took nitroglycerin and an extra clonidine and eventually felt better and was able to sleep. This morning while she was getting ready for her today she developed an aching behind her chest almost like "gas pains" so she decided to call EMS. She reports she feels well now and symptoms have resolved. She does report a history of heart disease and had a coronary artery bypass in the past. She sees Dr. Gwen Pounds of cardiology     Past Medical History  Diagnosis Date  . HTN (hypertension)   . Coronary artery disease   . Glaucoma   . Osteoporosis     Patient Active Problem List   Diagnosis Date Noted  . MI (myocardial infarction) (HCC) 05/27/2015  . CHF (congestive heart failure) (HCC)   . UTI (urinary tract infection) 03/12/2015  . UTI (lower urinary tract infection) 03/12/2015    Past Surgical History  Procedure Laterality Date  . Cardiac surgery    . Cardiac surgery    . Coronary artery bypass graft      Current Outpatient Rx  Name  Route  Sig  Dispense  Refill  . ALPRAZolam (XANAX) 0.5 MG tablet   Oral   Take 1 tablet (0.5 mg total) by mouth 3 (three) times daily as needed for sleep or anxiety.   60 tablet   0   . aspirin EC 81 MG EC tablet   Oral   Take 1 tablet (81 mg total) by mouth daily.   30 tablet   0   . Calcium Carbonate-Vitamin D (CALCIUM 600+D) 600-400 MG-UNIT tablet   Oral   Take 1 tablet by mouth 2 (two) times daily.         . cholecalciferol (VITAMIN D) 1000 UNITS tablet   Oral   Take 1,000 Units by mouth daily.         . cloNIDine (CATAPRES) 0.2 MG tablet   Oral   Take 0.2 mg by  mouth 2 (two) times daily.         . clopidogrel (PLAVIX) 75 MG tablet   Oral   Take 1 tablet (75 mg total) by mouth daily with breakfast.   30 tablet   0   . CRANBERRY PO   Oral   Take 1 capsule by mouth daily.         . feeding supplement, ENSURE ENLIVE, (ENSURE ENLIVE) LIQD   Oral   Take 237 mLs by mouth 2 (two) times daily between meals. Patient not taking: Reported on 05/26/2015   237 mL   12   . hydrALAZINE (APRESOLINE) 50 MG tablet   Oral   Take 1 tablet (50 mg total) by mouth every 6 (six) hours.   80 tablet   0   . levothyroxine (SYNTHROID, LEVOTHROID) 25 MCG tablet   Oral   Take 25 mcg by mouth daily.          Marland Kitchen losartan (COZAAR) 50 MG tablet   Oral   Take 50 mg by mouth daily.         . Magnesium 100 MG TABS   Oral   Take 100 mg by mouth 2 (two)  times daily.         . metoprolol succinate (TOPROL-XL) 50 MG 24 hr tablet      1 tab po qd Patient not taking: Reported on 05/26/2015   30 tablet   3   . nitroGLYCERIN (NITROSTAT) 0.4 MG SL tablet   Sublingual   Place 0.4 mg under the tongue every 5 (five) minutes as needed for chest pain.         . potassium chloride (K-DUR) 10 MEQ tablet   Oral   Take 10 mEq by mouth daily.         . sertraline (ZOLOFT) 25 MG tablet   Oral   Take 25 mg by mouth daily.         . simvastatin (ZOCOR) 10 MG tablet   Oral   Take 1 tablet (10 mg total) by mouth daily at 6 PM.   30 tablet   0   . traMADol (ULTRAM) 50 MG tablet   Oral   Take 50 mg by mouth 3 (three) times daily as needed for moderate pain.         . vitamin B-12 (CYANOCOBALAMIN) 1000 MCG tablet   Oral   Take 1,000 mcg by mouth daily.           Allergies Benicar; Cardura; Ciprofloxacin; Fosamax; Lisinopril; Norvasc; Penicillins; Sulfa antibiotics; and Torsemide  Family History  Problem Relation Age of Onset  . Congestive Heart Failure Father   . CAD Brother     Social History Social History  Substance Use Topics  .  Smoking status: Never Smoker   . Smokeless tobacco: Not on file  . Alcohol Use: No    Review of Systems  Constitutional: Negative for fever. Eyes: Negative for redness ENT: Negative for sore throat Cardiovascular: As above Respiratory: Negative for shortness of breath. Gastrointestinal: Negative for abdominal pain Genitourinary: Negative for dysuria. Musculoskeletal: Negative for back pain. Skin: Negative for rash. Neurological: Negative for focal weakness Psychiatric: no anxiety    ____________________________________________   PHYSICAL EXAM:  VITAL SIGNS: ED Triage Vitals  Enc Vitals Group     BP 01/07/16 0758 180/83 mmHg     Pulse Rate 01/07/16 0758 60     Resp --      Temp 01/07/16 0758 97.9 F (36.6 C)     Temp Source 01/07/16 0758 Oral     SpO2 01/07/16 0758 98 %     Weight 01/07/16 0758 123 lb (55.792 kg)     Height 01/07/16 0758  (1.575 m)     Head Cir --      Peak Flow --      Pain Score --      Pain Loc --      Pain Edu? --      Excl. in GC? --      Constitutional: Alert and oriented. Well appearing and in no distress. Pleasant and interactive Eyes: Conjunctivae are normal. No erythema or injection ENT   Head: Normocephalic and atraumatic.   Mouth/Throat: Mucous membranes are moist. Cardiovascular: Normal rate, regular rhythm. Normal and symmetric distal pulses are present in the upper extremities.  Respiratory: Normal respiratory effort without tachypnea nor retractions. Breath sounds are clear and equal bilaterally.  Gastrointestinal: Soft and non-tender in all quadrants. No distention. There is no CVA tenderness. Genitourinary: deferred Musculoskeletal: Nontender with normal range of motion in all extremities. No lower extremity tenderness nor edema. Neurologic:  Normal speech and language. No gross focal neurologic deficits are  appreciated. Skin:  Skin is warm, dry and intact. No rash noted. Psychiatric: Mood and affect are normal.  Patient exhibits appropriate insight and judgment.  ____________________________________________    LABS (pertinent positives/negatives)  Labs Reviewed  CBC  COMPREHENSIVE METABOLIC PANEL  TROPONIN I  BRAIN NATRIURETIC PEPTIDE    ____________________________________________   EKG  ED ECG REPORT I, Jene Every, the attending physician, personally viewed and interpreted this ECG.   Date: 01/07/2016  EKG Time: 7:52 AM  Rate: 63  Rhythm: normal sinus rhythm  Axis: Left axis deviation  Intervals:left bundle branch block  ST&T Change: Nonspecific changes   ____________________________________________    RADIOLOGY  No acute distress  ____________________________________________   PROCEDURES  Procedure(s) performed: none  Critical Care performed: none  ____________________________________________   INITIAL IMPRESSION / ASSESSMENT AND PLAN / ED COURSE  Pertinent labs & imaging results that were available during my care of the patient were reviewed by me and considered in my medical decision making (see chart for details).  Patient with a history of CABG presents with complaints of chest aching this morning which has resolved upon arrival to the emergency department. We will check labs, EKG, chest x-ray and reevaluate.  ----------------------------------------- 10:35 AM on 01/07/2016 -----------------------------------------  Patient remains pain-free, labwork x-ray are unremarkable. Patient is anxious to go home as her son is returning home today. I asked her to at least stay for a second troponin to which she agreed although I recommended admission given her risk factors. ____________________________________________   FINAL CLINICAL IMPRESSION(S) / ED DIAGNOSES  Final diagnoses:  Chest pain, unspecified chest pain type          Jene Every, MD 01/07/16 [phone]

## 2016-01-07 NOTE — ED Notes (Signed)
Pt arrives via [facility] with reports of chest discomfort since last pm  Pt took nitro last pm and felt relief but this am when she took a nitro she has felt no relief  Pt states  "i just don't feel normal."   Describes pain as a "discomfort"  BP elevated  EMS reports that pt has taken her K, Ca, BP and fluid pills this am   PCP Hande  [facility]

## 2016-01-07 NOTE — Discharge Instructions (Signed)
Nonspecific Chest Pain  °Chest pain can be caused by many different conditions. There is always a chance that your pain could be related to something serious, such as a heart attack or a blood clot in your lungs. Chest pain can also be caused by conditions that are not life-threatening. If you have chest pain, it is very important to follow up with your health care provider. °CAUSES  °Chest pain can be caused by: °· Heartburn. °· Pneumonia or bronchitis. °· Anxiety or stress. °· Inflammation around your heart (pericarditis) or lung (pleuritis or pleurisy). °· A blood clot in your lung. °· A collapsed lung (pneumothorax). It can develop suddenly on its own (spontaneous pneumothorax) or from trauma to the chest. °· Shingles infection (varicella-zoster virus). °· Heart attack. °· Damage to the bones, muscles, and cartilage that make up your chest wall. This can include: °¨ Bruised bones due to injury. °¨ Strained muscles or cartilage due to frequent or repeated coughing or overwork. °¨ Fracture to one or more ribs. °¨ Sore cartilage due to inflammation (costochondritis). °RISK FACTORS  °Risk factors for chest pain may include: °· Activities that increase your risk for trauma or injury to your chest. °· Respiratory infections or conditions that cause frequent coughing. °· Medical conditions or overeating that can cause heartburn. °· Heart disease or family history of heart disease. °· Conditions or health behaviors that increase your risk of developing a blood clot. °· Having had chicken pox (varicella zoster). °SIGNS AND SYMPTOMS °Chest pain can feel like: °· Burning or tingling on the surface of your chest or deep in your chest. °· Crushing, pressure, aching, or squeezing pain. °· Dull or sharp pain that is worse when you move, cough, or take a deep breath. °· Pain that is also felt in your back, neck, shoulder, or arm, or pain that spreads to any of these areas. °Your chest pain may come and go, or it may stay  constant. °DIAGNOSIS °Lab tests or other studies may be needed to find the cause of your pain. Your health care provider may have you take a test called an ambulatory ECG (electrocardiogram). An ECG records your heartbeat patterns at the time the test is performed. You may also have other tests, such as: °· Transthoracic echocardiogram (TTE). During echocardiography, sound waves are used to create a picture of all of the heart structures and to look at how blood flows through your heart. °· Transesophageal echocardiogram (TEE). This is a more advanced imaging test that obtains images from inside your body. It allows your health care provider to see your heart in finer detail. °· Cardiac monitoring. This allows your health care provider to monitor your heart rate and rhythm in real time. °· Holter monitor. This is a portable device that records your heartbeat and can help to diagnose abnormal heartbeats. It allows your health care provider to track your heart activity for several days, if needed. °· Stress tests. These can be done through exercise or by taking medicine that makes your heart beat more quickly. °· Blood tests. °· Imaging tests. °TREATMENT  °Your treatment depends on what is causing your chest pain. Treatment may include: °· Medicines. These may include: °¨ Acid blockers for heartburn. °¨ Anti-inflammatory medicine. °¨ Pain medicine for inflammatory conditions. °¨ Antibiotic medicine, if an infection is present. °¨ Medicines to dissolve blood clots. °¨ Medicines to treat coronary artery disease. °· Supportive care for conditions that do not require medicines. This may include: °¨ Resting. °¨ Applying heat   or cold packs to injured areas. °¨ Limiting activities until pain decreases. °HOME CARE INSTRUCTIONS °· If you were prescribed an antibiotic medicine, finish it all even if you start to feel better. °· Avoid any activities that bring on chest pain. °· Do not use any tobacco products, including  cigarettes, chewing tobacco, or electronic cigarettes. If you need help quitting, ask your health care provider. °· Do not drink alcohol. °· Take medicines only as directed by your health care provider. °· Keep all follow-up visits as directed by your health care provider. This is important. This includes any further testing if your chest pain does not go away. °· If heartburn is the cause for your chest pain, you may be told to keep your head raised (elevated) while sleeping. This reduces the chance that acid will go from your stomach into your esophagus. °· Make lifestyle changes as directed by your health care provider. These may include: °¨ Getting regular exercise. Ask your health care provider to suggest some activities that are safe for you. °¨ Eating a heart-healthy diet. A registered dietitian can help you to learn healthy eating options. °¨ Maintaining a healthy weight. °¨ Managing diabetes, if necessary. °¨ Reducing stress. °SEEK MEDICAL CARE IF: °· Your chest pain does not go away after treatment. °· You have a rash with blisters on your chest. °· You have a fever. °SEEK IMMEDIATE MEDICAL CARE IF:  °· Your chest pain is worse. °· You have an increasing cough, or you cough up blood. °· You have severe abdominal pain. °· You have severe weakness. °· You faint. °· You have chills. °· You have sudden, unexplained chest discomfort. °· You have sudden, unexplained discomfort in your arms, back, neck, or jaw. °· You have shortness of breath at any time. °· You suddenly start to sweat, or your skin gets clammy. °· You feel nauseous or you vomit. °· You suddenly feel light-headed or dizzy. °· Your heart begins to beat quickly, or it feels like it is skipping beats. °These symptoms may represent a serious problem that is an emergency. Do not wait to see if the symptoms will go away. Get medical help right away. Call your local emergency services (911 in the [place] Do not drive yourself to the hospital. °  °This  information is not intended to replace advice given to you by your health care provider. Make sure you discuss any questions you have with your health care provider. °  °Document Released: 04/05/2005 Document Revised: 07/17/2014 Document Reviewed: 01/30/2014 °Elsevier Interactive Patient Education ©2016 [employer] ° °

## 2016-02-03 ENCOUNTER — Encounter: Payer: Self-pay | Admitting: Emergency Medicine

## 2016-02-03 ENCOUNTER — Emergency Department: Payer: Medicare Other

## 2016-02-03 ENCOUNTER — Emergency Department
Admission: EM | Admit: 2016-02-03 | Discharge: 2016-02-03 | Disposition: A | Discharge status: Home / Self Care | Payer: Medicare Other | Attending: Emergency Medicine | Admitting: Emergency Medicine

## 2016-02-03 DIAGNOSIS — I509 Heart failure, unspecified: Secondary | ICD-10-CM | POA: Diagnosis not present

## 2016-02-03 DIAGNOSIS — Z951 Presence of aortocoronary bypass graft: Secondary | ICD-10-CM | POA: Diagnosis not present

## 2016-02-03 DIAGNOSIS — Z7982 Long term (current) use of aspirin: Secondary | ICD-10-CM | POA: Diagnosis not present

## 2016-02-03 DIAGNOSIS — I2510 Atherosclerotic heart disease of native coronary artery without angina pectoris: Secondary | ICD-10-CM | POA: Diagnosis not present

## 2016-02-03 DIAGNOSIS — R0602 Shortness of breath: Secondary | ICD-10-CM | POA: Diagnosis present

## 2016-02-03 DIAGNOSIS — Z79899 Other long term (current) drug therapy: Secondary | ICD-10-CM | POA: Insufficient documentation

## 2016-02-03 DIAGNOSIS — I110 Hypertensive heart disease with heart failure: Secondary | ICD-10-CM | POA: Diagnosis not present

## 2016-02-03 DIAGNOSIS — R0600 Dyspnea, unspecified: Secondary | ICD-10-CM

## 2016-02-03 HISTORY — DX: Heart failure, unspecified: I50.9

## 2016-02-03 LAB — CBC WITH DIFFERENTIAL/PLATELET
BASOS PCT: 1 %
Basophils Absolute: 0.1 10*3/uL (ref 0–0.1)
EOS ABS: 0.1 10*3/uL (ref 0–0.7)
EOS PCT: 2 %
HCT: 39.1 % (ref 35.0–47.0)
Hemoglobin: 13.4 g/dL (ref 12.0–16.0)
LYMPHS ABS: 2.5 10*3/uL (ref 1.0–3.6)
Lymphocytes Relative: 28 %
MCH: 26.7 pg (ref 26.0–34.0)
MCHC: 34.2 g/dL (ref 32.0–36.0)
MCV: 78 fL — ABNORMAL LOW (ref 80.0–100.0)
MONOS PCT: 8 %
Monocytes Absolute: 0.7 10*3/uL (ref 0.2–0.9)
Neutro Abs: 5.6 10*3/uL (ref 1.4–6.5)
Neutrophils Relative %: 61 %
PLATELETS: 281 10*3/uL (ref 150–440)
RBC: 5.01 MIL/uL (ref 3.80–5.20)
RDW: 15.2 % — ABNORMAL HIGH (ref 11.5–14.5)
WBC: 9 10*3/uL (ref 3.6–11.0)

## 2016-02-03 LAB — BASIC METABOLIC PANEL
Anion gap: 10 (ref 5–15)
BUN: 25 mg/dL — AB (ref 6–20)
CHLORIDE: 95 mmol/L — AB (ref 101–111)
CO2: 35 mmol/L — AB (ref 22–32)
CREATININE: 1.11 mg/dL — AB (ref 0.44–1.00)
Calcium: 9.6 mg/dL (ref 8.9–10.3)
GFR calc non Af Amer: 41 mL/min — ABNORMAL LOW (ref >60)
GFR, EST AFRICAN AMERICAN: 48 mL/min — AB (ref >60)
Glucose, Bld: 93 mg/dL (ref 65–99)
Potassium: 3.2 mmol/L — ABNORMAL LOW (ref 3.5–5.1)
SODIUM: 140 mmol/L (ref 135–145)

## 2016-02-03 LAB — TROPONIN I
Troponin I: 0.03 ng/mL (ref <0.03)
Troponin I: <0.03 ng/mL (ref <0.03)

## 2016-02-03 LAB — BRAIN NATRIURETIC PEPTIDE: B Natriuretic Peptide: 307 pg/mL — ABNORMAL HIGH (ref 0.0–100.0)

## 2016-02-03 MED ORDER — CLONIDINE HCL 0.1 MG PO TABS: 0.2000 mg | ORAL_TABLET | Freq: Once | ORAL | Status: DC

## 2016-02-03 MED ORDER — FUROSEMIDE 40 MG PO TABS: 40.0000 mg | ORAL_TABLET | Freq: Every day | ORAL | 0 refills | Status: DC

## 2016-02-03 NOTE — ED Notes (Signed)
Assisted patient to the restroom.  

## 2016-02-03 NOTE — ED Notes (Signed)
The EKG was completed. Dr. Pershing Proud signed it. The EKG was then, exported into the system.

## 2016-02-03 NOTE — ED Notes (Signed)
Explained to the patient that the delay on her discharge paperwork is due to the physician waiting on a return call from the cardiologist.  Patient verbalized understanding of this information.

## 2016-02-03 NOTE — ED Notes (Signed)
Pt neighbor Janean Sark, [phone] or [phone] will come pick pt up upon discharge.

## 2016-02-03 NOTE — ED Triage Notes (Signed)
Pt arrived via EMS from home for reports of SOB. EMS reports 236/100, 70 HR, 98% RA, CBG 94, LBB on monitor.

## 2016-02-03 NOTE — ED Notes (Signed)
Report received - bnp still outstanding from 80am - spoke with lab and they said it is "running" and should result soon.

## 2016-02-03 NOTE — ED Notes (Signed)
Pt taken to toilet. Family at bedside upset saying that no one has been in to see pt since she's been here. Let the daughter know that I had been in at 1110 and introduced myself. Daughter interrupted saying that wasn't true - and states wont discuss it with me. Charge nurse informed.

## 2016-02-03 NOTE — ED Provider Notes (Signed)
[facility] Emergency Department Provider Note   ____________________________________________   First MD Initiated Contact with Patient 02/03/16 [phone]     (approximate)  I have reviewed the triage vital signs and the nursing notes.   HISTORY  Chief Complaint Shortness of Breath   HPI OLIVIANNA Ward is a 82 y.o. female with a history of CHF as well as aortic stenosis and coronary artery disease is presenting to the emergency department with shortness of breath since this morning. She said that she had severe shortness of breath when walking this morning. Says that her blood pressure has also been elevated. Says that her blood pressures usually not elevated the mornings. She also has not taken her morning blood pressure medications yet. Says that she has tightness with breathing but no chest pain.   Past Medical History:  Diagnosis Date  . Congestive heart failure (CHF) (HCC)   . Coronary artery disease   . Glaucoma   . HTN (hypertension)   . Osteoporosis     Patient Active Problem List   Diagnosis Date Noted  . MI (myocardial infarction) (HCC) 05/27/2015  . CHF (congestive heart failure) (HCC)   . UTI (urinary tract infection) 03/12/2015  . UTI (lower urinary tract infection) 03/12/2015    Past Surgical History:  Procedure Laterality Date  . CARDIAC SURGERY    . CARDIAC SURGERY    . CORONARY ARTERY BYPASS GRAFT      Prior to Admission medications   Medication Sig Start Date End Date Taking? Authorizing Provider  ALPRAZolam Prudy Feeler) 0.5 MG tablet Take 1 tablet (0.5 mg total) by mouth 3 (three) times daily as needed for sleep or anxiety. 05/17/15 05/16/16  Emily Filbert, MD  aspirin EC 81 MG EC tablet Take 1 tablet (81 mg total) by mouth daily. 05/29/15   Altamese Dilling, MD  Calcium Carbonate-Vitamin D (CALCIUM 600+D) 600-400 MG-UNIT tablet Take 1 tablet by mouth 2 (two) times daily.    Historical Provider, MD  cholecalciferol (VITAMIN  D) 1000 UNITS tablet Take 1,000 Units by mouth daily.    Historical Provider, MD  cloNIDine (CATAPRES) 0.2 MG tablet Take 0.2 mg by mouth 2 (two) times daily.    Historical Provider, MD  clopidogrel (PLAVIX) 75 MG tablet Take 1 tablet (75 mg total) by mouth daily with breakfast. 05/29/15   Altamese Dilling, MD  CRANBERRY PO Take 1 capsule by mouth daily.    Historical Provider, MD  feeding supplement, ENSURE ENLIVE, (ENSURE ENLIVE) LIQD Take 237 mLs by mouth 2 (two) times daily between meals. Patient not taking: Reported on 05/26/2015 03/19/15   Barbette Reichmann, MD  hydrALAZINE (APRESOLINE) 50 MG tablet Take 1 tablet (50 mg total) by mouth every 6 (six) hours. 05/29/15   Altamese Dilling, MD  levothyroxine (SYNTHROID, LEVOTHROID) 25 MCG tablet Take 25 mcg by mouth daily.     Historical Provider, MD  losartan (COZAAR) 50 MG tablet Take 50 mg by mouth daily.    Historical Provider, MD  Magnesium 100 MG TABS Take 100 mg by mouth 2 (two) times daily.    Historical Provider, MD  metoprolol succinate (TOPROL-XL) 50 MG 24 hr tablet 1 tab po qd Patient not taking: Reported on 05/26/2015 03/19/15   Barbette Reichmann, MD  nitroGLYCERIN (NITROSTAT) 0.4 MG SL tablet Place 0.4 mg under the tongue every 5 (five) minutes as needed for chest pain.    Historical Provider, MD  potassium chloride (K-DUR) 10 MEQ tablet Take 10 mEq by mouth daily.  Historical Provider, MD  sertraline (ZOLOFT) 25 MG tablet Take 25 mg by mouth daily.    Historical Provider, MD  simvastatin (ZOCOR) 10 MG tablet Take 1 tablet (10 mg total) by mouth daily at 6 PM. 05/29/15   Altamese Dilling, MD  traMADol (ULTRAM) 50 MG tablet Take 50 mg by mouth 3 (three) times daily as needed for moderate pain.    Historical Provider, MD  vitamin B-12 (CYANOCOBALAMIN) 1000 MCG tablet Take 1,000 mcg by mouth daily.    Historical Provider, MD    Allergies Benicar [olmesartan]; Cardura [doxazosin mesylate]; Ciprofloxacin; Fosamax [alendronate  sodium]; Lisinopril; Norvasc [amlodipine besylate]; Penicillins; Sulfa antibiotics; and Torsemide  Family History  Problem Relation Age of Onset  . Congestive Heart Failure Father   . CAD Brother     Social History Social History  Substance Use Topics  . Smoking status: Never Smoker  . Smokeless tobacco: Not on file  . Alcohol use No    Review of Systems Constitutional: No fever/chills Eyes: No visual changes. ENT: No sore throat. Cardiovascular: Denies chest pain. Respiratory: As above Gastrointestinal: No abdominal pain.  No nausea, no vomiting.  No diarrhea.  No constipation. Genitourinary: Negative for dysuria. Musculoskeletal: Negative for back pain. Skin: Negative for rash. Neurological: Negative for headaches, focal weakness or numbness.  10-point ROS otherwise negative.  ____________________________________________   PHYSICAL EXAM:  VITAL SIGNS: ED Triage Vitals [02/03/16 0728]  Enc Vitals Group     BP (!) 210/72     Pulse Rate 69     Resp 18     Temp 98 F (36.7 C)     Temp Source Oral     SpO2 98 %     Weight 125 lb (56.7 kg)     Height      Head Circumference      Peak Flow      Pain Score      Pain Loc      Pain Edu?      Excl. in GC?     Constitutional: Alert and oriented. Well appearing and in no acute distress. Eyes: Conjunctivae are normal. PERRL. EOMI. Head: Atraumatic. Nose: No congestion/rhinnorhea. Mouth/Throat: Mucous membranes are moist.   Neck: No stridor.   Cardiovascular: Normal rate, regular rhythm. Grossly normal heart sounds.  Good peripheral circulation. Respiratory: Normal respiratory effort.  No retractions. Rales to the bilateral lower fields extending to the mid fields. Gastrointestinal: Soft and nontender. No distention.  Musculoskeletal: No lower extremity tenderness nor edema.  No joint effusions. Neurologic:  Normal speech and language. No gross focal neurologic deficits are appreciated.  Skin:  Skin is warm, dry  and intact. No rash noted. Psychiatric: Mood and affect are normal. Speech and behavior are normal.  ____________________________________________   LABS (all labs ordered are listed, but only abnormal results are displayed)  Labs Reviewed  CBC WITH DIFFERENTIAL/PLATELET  BASIC METABOLIC PANEL  BRAIN NATRIURETIC PEPTIDE  TROPONIN I   ____________________________________________  EKG  ED ECG REPORT I, Latrena Benegas,  Teena Irani, the attending physician, personally viewed and interpreted this ECG.   Date: 02/03/2016  EKG Time: 725  Rate: 70  Rhythm: normal sinus rhythm  Axis: Normal  Intervals:left bundle branch block  ST&T Change: ST elevations or depressions consistent with a left bundle branch block pattern. T wave inversions in lead 1 as well as aVL unchanged from previous. T wave inversion/biphasic T waves in V6 which is similar in appearance for more pronounced that EKG done on 11/26/2015.  ____________________________________________  RADIOLOGY  CLINICAL DATA:  Shortness of breath.  Hypertension EXAM: CHEST  2 VIEW COMPARISON:  01/07/2016 FINDINGS: Cough prior CABG. Cardiomegaly. Diffuse aortic calcifications. No aneurysm. Diffuse interstitial prominence throughout the lungs, likely chronic interstitial lung disease. Mild hyperinflation. No confluent opacities or effusions. No acute bony abnormality. IMPRESSION: Stable cardiomegaly. Diffuse interstitial prominence, likely chronic interstitial lung disease. Hyperinflation. Electronically Signed   By: Charlett Nose M.D.   On: 02/03/2016 08:18 ____________________________________________   PROCEDURES    Procedures   ____________________________________________   INITIAL IMPRESSION / ASSESSMENT AND PLAN / ED COURSE  Pertinent labs & imaging results that were available during my care of the patient were reviewed by me and considered in my medical decision making (see chart for details).  Patient took blood  pressure medications here in the emergency department. Pending labs as well as imaging at this time.  Clinical Course   ----------------------------------------- 12:58 PM on 02/03/2016 -----------------------------------------  Patient is resting comfortably at this time and without any respiratory distress. I discussed the case Dr. Juliann Pares who recommends giving the patient does of IV Lasix in the department and then increasing the patient's dose to 40 mg twice a day of Lasix. I discussed this plan with the patient as well as her daughter was at the bedside and the patient is understanding as well as her daughter and willing to comply. She will also be following up with Dr. Gwen Pounds. Labs appear to be largely at baseline. The second troponin was undetectable.  ____________________________________________   FINAL CLINICAL IMPRESSION(S) / ED DIAGNOSES  Shortness of breath.    NEW MEDICATIONS STARTED DURING THIS VISIT:  New Prescriptions   No medications on file     Note:  This document was prepared using Dragon voice recognition software and may include unintentional dictation errors.    Myrna Blazer, MD 02/03/16 [phone]

## 2016-02-03 NOTE — ED Notes (Signed)
Dr. Pershing Proud notified of troponin 0.03

## 2016-02-07 ENCOUNTER — Encounter: Payer: Self-pay | Admitting: Emergency Medicine

## 2016-02-07 ENCOUNTER — Emergency Department
Admission: EM | Admit: 2016-02-07 | Discharge: 2016-02-07 | Disposition: A | Discharge status: Home / Self Care | Payer: Medicare Other | Attending: Emergency Medicine | Admitting: Emergency Medicine

## 2016-02-07 DIAGNOSIS — IMO0001 Reserved for inherently not codable concepts without codable children: Secondary | ICD-10-CM

## 2016-02-07 DIAGNOSIS — I252 Old myocardial infarction: Secondary | ICD-10-CM | POA: Insufficient documentation

## 2016-02-07 DIAGNOSIS — I110 Hypertensive heart disease with heart failure: Secondary | ICD-10-CM | POA: Insufficient documentation

## 2016-02-07 DIAGNOSIS — Z951 Presence of aortocoronary bypass graft: Secondary | ICD-10-CM | POA: Diagnosis not present

## 2016-02-07 DIAGNOSIS — I509 Heart failure, unspecified: Secondary | ICD-10-CM | POA: Insufficient documentation

## 2016-02-07 DIAGNOSIS — Z7982 Long term (current) use of aspirin: Secondary | ICD-10-CM | POA: Diagnosis not present

## 2016-02-07 DIAGNOSIS — I10 Essential (primary) hypertension: Secondary | ICD-10-CM | POA: Diagnosis present

## 2016-02-07 DIAGNOSIS — Z79899 Other long term (current) drug therapy: Secondary | ICD-10-CM | POA: Insufficient documentation

## 2016-02-07 DIAGNOSIS — I2510 Atherosclerotic heart disease of native coronary artery without angina pectoris: Secondary | ICD-10-CM | POA: Diagnosis not present

## 2016-02-07 DIAGNOSIS — R030 Elevated blood-pressure reading, without diagnosis of hypertension: Secondary | ICD-10-CM

## 2016-02-07 LAB — CBC
HCT: 34.4 % — ABNORMAL LOW (ref 35.0–47.0)
Hemoglobin: 11.8 g/dL — ABNORMAL LOW (ref 12.0–16.0)
MCH: 26.8 pg (ref 26.0–34.0)
MCHC: 34.4 g/dL (ref 32.0–36.0)
MCV: 77.9 fL — ABNORMAL LOW (ref 80.0–100.0)
PLATELETS: 253 10*3/uL (ref 150–440)
RBC: 4.41 MIL/uL (ref 3.80–5.20)
RDW: 15.1 % — ABNORMAL HIGH (ref 11.5–14.5)
WBC: 10 10*3/uL (ref 3.6–11.0)

## 2016-02-07 LAB — COMPREHENSIVE METABOLIC PANEL
ALBUMIN: 3.2 g/dL — AB (ref 3.5–5.0)
ALT: 13 U/L — AB (ref 14–54)
AST: 20 U/L (ref 15–41)
Alkaline Phosphatase: 61 U/L (ref 38–126)
Anion gap: 6 (ref 5–15)
BUN: 25 mg/dL — ABNORMAL HIGH (ref 6–20)
CHLORIDE: 95 mmol/L — AB (ref 101–111)
CO2: 34 mmol/L — AB (ref 22–32)
CREATININE: 1 mg/dL (ref 0.44–1.00)
Calcium: 8.7 mg/dL — ABNORMAL LOW (ref 8.9–10.3)
GFR calc non Af Amer: 47 mL/min — ABNORMAL LOW (ref >60)
GFR, EST AFRICAN AMERICAN: 54 mL/min — AB (ref >60)
GLUCOSE: 107 mg/dL — AB (ref 65–99)
Potassium: 3 mmol/L — ABNORMAL LOW (ref 3.5–5.1)
SODIUM: 135 mmol/L (ref 135–145)
Total Bilirubin: 0.7 mg/dL (ref 0.3–1.2)
Total Protein: 6.7 g/dL (ref 6.5–8.1)

## 2016-02-07 LAB — TROPONIN I

## 2016-02-07 NOTE — ED Provider Notes (Signed)
[facility] Emergency Department Provider Note  Time seen: 7:43 AM  I have reviewed the triage vital signs and the nursing notes.   HISTORY  Chief Complaint Hypertension    HPI Anita Ward is a 82 y.o. female with a past medical history of CHF, CAD, hypertension, who presents the emergency department with hypertension. According to the patientfor the past few days she has been watching her blood pressure as it has been a little higher than normal. She states last night she took her blood pressure medication little early because her blood pressure was running high, she then took a clonidine just to keep her blood pressure down overnight. She states she woke this morning, felt some mild chest tightness which she typically feels when her blood pressure elevated so she took her blood pressure which was 228/106, she was concerned that she should not be staying by herself at home so she called EMS to bring her to the hospital. Patient denies any symptoms at this time.  Past Medical History:  Diagnosis Date  . Congestive heart failure (CHF) (HCC)   . Coronary artery disease   . Glaucoma   . HTN (hypertension)   . Osteoporosis     Patient Active Problem List   Diagnosis Date Noted  . MI (myocardial infarction) (HCC) 05/27/2015  . CHF (congestive heart failure) (HCC)   . UTI (urinary tract infection) 03/12/2015  . UTI (lower urinary tract infection) 03/12/2015    Past Surgical History:  Procedure Laterality Date  . CARDIAC SURGERY    . CARDIAC SURGERY    . CORONARY ARTERY BYPASS GRAFT      Prior to Admission medications   Medication Sig Start Date End Date Taking? Authorizing Provider  ALPRAZolam Prudy Feeler) 0.5 MG tablet Take 1 tablet (0.5 mg total) by mouth 3 (three) times daily as needed for sleep or anxiety. 05/17/15 05/16/16  Emily Filbert, MD  aspirin EC 81 MG EC tablet Take 1 tablet (81 mg total) by mouth daily. 05/29/15   Altamese Dilling, MD   Calcium Carbonate-Vitamin D (CALCIUM 600+D) 600-400 MG-UNIT tablet Take 1 tablet by mouth 2 (two) times daily.    Historical Provider, MD  cholecalciferol (VITAMIN D) 1000 UNITS tablet Take 1,000 Units by mouth daily.    Historical Provider, MD  cloNIDine (CATAPRES) 0.2 MG tablet Take 0.2 mg by mouth 2 (two) times daily.    Historical Provider, MD  clopidogrel (PLAVIX) 75 MG tablet Take 1 tablet (75 mg total) by mouth daily with breakfast. 05/29/15   Altamese Dilling, MD  CRANBERRY PO Take 1 capsule by mouth daily.    Historical Provider, MD  feeding supplement, ENSURE ENLIVE, (ENSURE ENLIVE) LIQD Take 237 mLs by mouth 2 (two) times daily between meals. Patient not taking: Reported on 05/26/2015 03/19/15   Barbette Reichmann, MD  furosemide (LASIX) 40 MG tablet Take 1 tablet (40 mg total) by mouth at bedtime. 02/03/16 02/02/17  Myrna Blazer, MD  hydrALAZINE (APRESOLINE) 50 MG tablet Take 1 tablet (50 mg total) by mouth every 6 (six) hours. 05/29/15   Altamese Dilling, MD  levothyroxine (SYNTHROID, LEVOTHROID) 25 MCG tablet Take 25 mcg by mouth daily.     Historical Provider, MD  losartan (COZAAR) 50 MG tablet Take 50 mg by mouth daily.    Historical Provider, MD  Magnesium 100 MG TABS Take 100 mg by mouth 2 (two) times daily.    Historical Provider, MD  metoprolol succinate (TOPROL-XL) 50 MG 24 hr tablet  1 tab po qd Patient not taking: Reported on 05/26/2015 03/19/15   Barbette Reichmann, MD  nitroGLYCERIN (NITROSTAT) 0.4 MG SL tablet Place 0.4 mg under the tongue every 5 (five) minutes as needed for chest pain.    Historical Provider, MD  potassium chloride (K-DUR) 10 MEQ tablet Take 10 mEq by mouth daily.    Historical Provider, MD  sertraline (ZOLOFT) 25 MG tablet Take 25 mg by mouth daily.    Historical Provider, MD  simvastatin (ZOCOR) 10 MG tablet Take 1 tablet (10 mg total) by mouth daily at 6 PM. 05/29/15   Altamese Dilling, MD  traMADol (ULTRAM) 50 MG tablet Take 50 mg by  mouth 3 (three) times daily as needed for moderate pain.    Historical Provider, MD  vitamin B-12 (CYANOCOBALAMIN) 1000 MCG tablet Take 1,000 mcg by mouth daily.    Historical Provider, MD    Allergies  Allergen Reactions  . Benicar [Olmesartan] Other (See Comments)    Reaction:  Unknown   . Cardura [Doxazosin Mesylate] Other (See Comments)    Reaction:  Unknown   . Ciprofloxacin Other (See Comments)    Reaction:  Unknown  . Fosamax [Alendronate Sodium] Other (See Comments)    Reaction:  Unknown  . Lisinopril Other (See Comments)    Reaction:  Unknown   . Norvasc [Amlodipine Besylate] Other (See Comments)    Reaction:  Unknown   . Penicillins Other (See Comments)    Reaction:  Unknown   . Sulfa Antibiotics Other (See Comments)    Reaction:  Unknown   . Torsemide Anxiety    Family History  Problem Relation Age of Onset  . Congestive Heart Failure Father   . CAD Brother     Social History Social History  Substance Use Topics  . Smoking status: Never Smoker  . Smokeless tobacco: Not on file  . Alcohol use No    Review of Systems Constitutional: Negative for fever. Cardiovascular: Negative for chest pain. Respiratory: Negative for shortness of breath. Gastrointestinal: Negative for abdominal pain Genitourinary: Negative for dysuria. Musculoskeletal: Negative for back pain. Skin: Negative for rash. Neurological: Negative for headaches, focal weakness or numbness. 10-point ROS otherwise negative.  ____________________________________________   PHYSICAL EXAM:  VITAL SIGNS: ED Triage Vitals [02/07/16 0731]  Enc Vitals Group     BP (!) 182/73     Pulse Rate 62     Resp      Temp 98 F (36.7 C)     Temp Source Oral     SpO2 98 %     Weight 125 lb (56.7 kg)     Height  (1.549 m)     Head Circumference      Peak Flow      Pain Score      Pain Loc      Pain Edu?      Excl. in GC?     Constitutional: Alert and oriented. Well appearing and in no  distress. Eyes: Normal exam ENT   Head: Normocephalic and atraumatic.   Mouth/Throat: Mucous membranes are moist. Cardiovascular: Normal rate, regular rhythm. No murmur Respiratory: Normal respiratory effort without tachypnea nor retractions. Breath sounds are clear  Gastrointestinal: Soft and nontender. No distention.  Musculoskeletal: Nontender with normal range of motion in all extremities. No calf tenderness. Neurologic:  Normal speech and language. No gross focal neurologic deficits are appreciated. Skin:  Skin is warm, dry and intact.  Psychiatric: Mood and affect are normal. Speech and behavior are  normal.   ____________________________________________    EKG  EKG reviewed and interpreted by myself shows normal sinus rhythm at 61 bpm, slightly widened QRS, normal axis, nonspecific ST changes, no concerning ST elevation.  ____________________________________________    INITIAL IMPRESSION / ASSESSMENT AND PLAN / ED COURSE  Pertinent labs & imaging results that were available during my care of the patient were reviewed by me and considered in my medical decision making (see chart for details).  Patient presents emergency department hypertension 228/106 this morning. Patient took all of her morning blood pressure medications; called EMS to bring her to the hospital. Currently the patient's blood pressure is 186/73. Denies any symptoms at this time. She did note some chest tightness earlier this morning, but states she typically gets chest tightness with hypertension. We will check labs including troponin.  Labs are largely the patient's baseline. Slight hypokalemia. We'll have the patient follow up with her primary care doctor tomorrow for recheck. Patient's blood pressure has improved. No complaints at this time. ____________________________________________   FINAL CLINICAL IMPRESSION(S) / ED DIAGNOSES  Hypertension    Minna Antis, MD 02/07/16 1004

## 2016-02-07 NOTE — ED Triage Notes (Signed)
Ems from home for hypertension. Pt reports that BP elevated above her normal since yesterday. Pt says that she has been taking meds as prescribed. Pt denies pain. Hx bypass.

## 2016-02-19 ENCOUNTER — Emergency Department: Payer: Medicare Other

## 2016-02-19 ENCOUNTER — Inpatient Hospital Stay
Admission: EM | Admit: 2016-02-19 | Discharge: 2016-02-22 | DRG: 641 | Disposition: A | Discharge status: Home Health | Payer: Medicare Other | Attending: Specialist | Admitting: Specialist

## 2016-02-19 ENCOUNTER — Encounter: Payer: Self-pay | Admitting: Emergency Medicine

## 2016-02-19 DIAGNOSIS — Z7982 Long term (current) use of aspirin: Secondary | ICD-10-CM | POA: Diagnosis not present

## 2016-02-19 DIAGNOSIS — Z8249 Family history of ischemic heart disease and other diseases of the circulatory system: Secondary | ICD-10-CM

## 2016-02-19 DIAGNOSIS — Z7902 Long term (current) use of antithrombotics/antiplatelets: Secondary | ICD-10-CM | POA: Diagnosis not present

## 2016-02-19 DIAGNOSIS — E785 Hyperlipidemia, unspecified: Secondary | ICD-10-CM | POA: Diagnosis present

## 2016-02-19 DIAGNOSIS — I2510 Atherosclerotic heart disease of native coronary artery without angina pectoris: Secondary | ICD-10-CM | POA: Diagnosis present

## 2016-02-19 DIAGNOSIS — E871 Hypo-osmolality and hyponatremia: Principal | ICD-10-CM | POA: Diagnosis present

## 2016-02-19 DIAGNOSIS — I110 Hypertensive heart disease with heart failure: Secondary | ICD-10-CM | POA: Diagnosis present

## 2016-02-19 DIAGNOSIS — Z79899 Other long term (current) drug therapy: Secondary | ICD-10-CM | POA: Diagnosis not present

## 2016-02-19 DIAGNOSIS — E876 Hypokalemia: Secondary | ICD-10-CM | POA: Diagnosis not present

## 2016-02-19 DIAGNOSIS — M810 Age-related osteoporosis without current pathological fracture: Secondary | ICD-10-CM | POA: Diagnosis present

## 2016-02-19 DIAGNOSIS — H409 Unspecified glaucoma: Secondary | ICD-10-CM | POA: Diagnosis present

## 2016-02-19 DIAGNOSIS — E039 Hypothyroidism, unspecified: Secondary | ICD-10-CM | POA: Diagnosis present

## 2016-02-19 DIAGNOSIS — E860 Dehydration: Secondary | ICD-10-CM

## 2016-02-19 DIAGNOSIS — E861 Hypovolemia: Secondary | ICD-10-CM | POA: Diagnosis present

## 2016-02-19 DIAGNOSIS — Z951 Presence of aortocoronary bypass graft: Secondary | ICD-10-CM

## 2016-02-19 DIAGNOSIS — F419 Anxiety disorder, unspecified: Secondary | ICD-10-CM | POA: Diagnosis present

## 2016-02-19 DIAGNOSIS — F329 Major depressive disorder, single episode, unspecified: Secondary | ICD-10-CM | POA: Diagnosis present

## 2016-02-19 DIAGNOSIS — R0789 Other chest pain: Secondary | ICD-10-CM | POA: Diagnosis present

## 2016-02-19 DIAGNOSIS — I5032 Chronic diastolic (congestive) heart failure: Secondary | ICD-10-CM | POA: Diagnosis present

## 2016-02-19 DIAGNOSIS — I959 Hypotension, unspecified: Secondary | ICD-10-CM | POA: Diagnosis present

## 2016-02-19 DIAGNOSIS — R079 Chest pain, unspecified: Secondary | ICD-10-CM | POA: Diagnosis present

## 2016-02-19 LAB — URINALYSIS COMPLETE WITH MICROSCOPIC (ARMC ONLY)
Bilirubin Urine: NEGATIVE
Glucose, UA: NEGATIVE mg/dL
HGB URINE DIPSTICK: NEGATIVE
KETONES UR: NEGATIVE mg/dL
NITRITE: NEGATIVE
PROTEIN: NEGATIVE mg/dL
SPECIFIC GRAVITY, URINE: 1.008 (ref 1.005–1.030)
pH: 7 (ref 5.0–8.0)

## 2016-02-19 LAB — CBC
HCT: 34.7 % — ABNORMAL LOW (ref 35.0–47.0)
Hemoglobin: 12.2 g/dL (ref 12.0–16.0)
MCH: 26.7 pg (ref 26.0–34.0)
MCHC: 35.1 g/dL (ref 32.0–36.0)
MCV: 76.1 fL — AB (ref 80.0–100.0)
PLATELETS: 273 10*3/uL (ref 150–440)
RBC: 4.56 MIL/uL (ref 3.80–5.20)
RDW: 14.7 % — AB (ref 11.5–14.5)
WBC: 13.1 10*3/uL — ABNORMAL HIGH (ref 3.6–11.0)

## 2016-02-19 LAB — COMPREHENSIVE METABOLIC PANEL
ALT: 12 U/L — AB (ref 14–54)
ANION GAP: 9 (ref 5–15)
AST: 22 U/L (ref 15–41)
Albumin: 3.5 g/dL (ref 3.5–5.0)
Alkaline Phosphatase: 65 U/L (ref 38–126)
BUN: 32 mg/dL — ABNORMAL HIGH (ref 6–20)
CHLORIDE: 76 mmol/L — AB (ref 101–111)
CO2: 37 mmol/L — AB (ref 22–32)
CREATININE: 1.09 mg/dL — AB (ref 0.44–1.00)
Calcium: 9.3 mg/dL (ref 8.9–10.3)
GFR, EST AFRICAN AMERICAN: 49 mL/min — AB (ref >60)
GFR, EST NON AFRICAN AMERICAN: 42 mL/min — AB (ref >60)
Glucose, Bld: 121 mg/dL — ABNORMAL HIGH (ref 65–99)
POTASSIUM: 2.6 mmol/L — AB (ref 3.5–5.1)
SODIUM: 122 mmol/L — AB (ref 135–145)
Total Bilirubin: 1 mg/dL (ref 0.3–1.2)
Total Protein: 7 g/dL (ref 6.5–8.1)

## 2016-02-19 LAB — GLUCOSE, CAPILLARY: Glucose-Capillary: 133 mg/dL — ABNORMAL HIGH (ref 65–99)

## 2016-02-19 LAB — TROPONIN I

## 2016-02-19 LAB — MAGNESIUM: MAGNESIUM: 2.3 mg/dL (ref 1.7–2.4)

## 2016-02-19 MED ORDER — ENOXAPARIN SODIUM 30 MG/0.3ML ~~LOC~~ SOLN
30.0000 mg | SUBCUTANEOUS | Status: DC
  Administered 2016-02-19: 30 mg via SUBCUTANEOUS
  Filled 2016-02-19: qty 0.3

## 2016-02-19 MED ORDER — NITROGLYCERIN 0.4 MG SL SUBL
0.4000 mg | SUBLINGUAL_TABLET | SUBLINGUAL | Status: DC | PRN
  Administered 2016-02-21: 0.4 mg via SUBLINGUAL
  Filled 2016-02-19: qty 1

## 2016-02-19 MED ORDER — TRAMADOL HCL 50 MG PO TABS
50.0000 mg | ORAL_TABLET | Freq: Two times a day (BID) | ORAL | Status: DC | PRN
  Administered 2016-02-22: 50 mg via ORAL
  Filled 2016-02-19: qty 1

## 2016-02-19 MED ORDER — ACETAMINOPHEN 650 MG RE SUPP: 650.0000 mg | Freq: Four times a day (QID) | RECTAL | Status: DC | PRN

## 2016-02-19 MED ORDER — LEVOTHYROXINE SODIUM 50 MCG PO TABS
25.0000 ug | ORAL_TABLET | Freq: Every day | ORAL | Status: DC
  Administered 2016-02-20 – 2016-02-22 (×3): 25 ug via ORAL
  Filled 2016-02-19 (×3): qty 1

## 2016-02-19 MED ORDER — ONDANSETRON HCL 4 MG PO TABS: 4.0000 mg | ORAL_TABLET | Freq: Four times a day (QID) | ORAL | Status: DC | PRN

## 2016-02-19 MED ORDER — SODIUM CHLORIDE 0.9 % IV SOLN
INTRAVENOUS | Status: DC
  Administered 2016-02-19 – 2016-02-22 (×5): via INTRAVENOUS

## 2016-02-19 MED ORDER — LOSARTAN POTASSIUM 50 MG PO TABS
50.0000 mg | ORAL_TABLET | Freq: Every day | ORAL | Status: DC
  Administered 2016-02-19 – 2016-02-21 (×3): 50 mg via ORAL
  Filled 2016-02-19 (×3): qty 1

## 2016-02-19 MED ORDER — ACETAMINOPHEN 325 MG PO TABS
650.0000 mg | ORAL_TABLET | Freq: Four times a day (QID) | ORAL | Status: DC | PRN
  Administered 2016-02-20 – 2016-02-21 (×2): 650 mg via ORAL
  Filled 2016-02-19 (×2): qty 2

## 2016-02-19 MED ORDER — OXYCODONE HCL 5 MG PO TABS: 5.0000 mg | ORAL_TABLET | ORAL | Status: DC | PRN

## 2016-02-19 MED ORDER — SERTRALINE HCL 50 MG PO TABS
25.0000 mg | ORAL_TABLET | Freq: Every day | ORAL | Status: DC
  Administered 2016-02-20 – 2016-02-22 (×3): 25 mg via ORAL
  Filled 2016-02-19 (×4): qty 1

## 2016-02-19 MED ORDER — ONDANSETRON HCL 4 MG/2ML IJ SOLN: 4.0000 mg | Freq: Four times a day (QID) | INTRAMUSCULAR | Status: DC | PRN

## 2016-02-19 MED ORDER — SODIUM CHLORIDE 0.9% FLUSH
3.0000 mL | Freq: Two times a day (BID) | INTRAVENOUS | Status: DC
  Administered 2016-02-19 – 2016-02-21 (×4): 3 mL via INTRAVENOUS

## 2016-02-19 MED ORDER — VITAMIN D 1000 UNITS PO TABS
1000.0000 [IU] | ORAL_TABLET | Freq: Every day | ORAL | Status: DC
  Administered 2016-02-20 – 2016-02-22 (×3): 1000 [IU] via ORAL
  Filled 2016-02-19 (×3): qty 1

## 2016-02-19 MED ORDER — ASPIRIN EC 81 MG PO TBEC
81.0000 mg | DELAYED_RELEASE_TABLET | Freq: Every day | ORAL | Status: DC
  Administered 2016-02-19 – 2016-02-22 (×4): 81 mg via ORAL
  Filled 2016-02-19 (×4): qty 1

## 2016-02-19 MED ORDER — SODIUM CHLORIDE 0.9 % IV BOLUS (SEPSIS)
500.0000 mL | Freq: Once | INTRAVENOUS | Status: AC
  Administered 2016-02-19: 500 mL via INTRAVENOUS

## 2016-02-19 MED ORDER — SIMVASTATIN 10 MG PO TABS
10.0000 mg | ORAL_TABLET | Freq: Every day | ORAL | Status: DC
  Administered 2016-02-19 – 2016-02-21 (×3): 10 mg via ORAL
  Filled 2016-02-19 (×3): qty 1

## 2016-02-19 MED ORDER — POTASSIUM CHLORIDE CRYS ER 20 MEQ PO TBCR
40.0000 meq | EXTENDED_RELEASE_TABLET | Freq: Once | ORAL | Status: AC
  Administered 2016-02-19: 40 meq via ORAL
  Filled 2016-02-19: qty 2

## 2016-02-19 MED ORDER — CLOPIDOGREL BISULFATE 75 MG PO TABS
75.0000 mg | ORAL_TABLET | Freq: Every day | ORAL | Status: DC
  Administered 2016-02-20 – 2016-02-22 (×3): 75 mg via ORAL
  Filled 2016-02-19 (×3): qty 1

## 2016-02-19 MED ORDER — MAGNESIUM OXIDE 400 (241.3 MG) MG PO TABS
200.0000 mg | ORAL_TABLET | Freq: Every day | ORAL | Status: DC
  Administered 2016-02-19 – 2016-02-22 (×4): 200 mg via ORAL
  Filled 2016-02-19: qty 1
  Filled 2016-02-19: qty 2
  Filled 2016-02-19 (×2): qty 1

## 2016-02-19 MED ORDER — HYDRALAZINE HCL 50 MG PO TABS
50.0000 mg | ORAL_TABLET | Freq: Four times a day (QID) | ORAL | Status: DC
  Administered 2016-02-19 – 2016-02-22 (×11): 50 mg via ORAL
  Filled 2016-02-19 (×11): qty 1

## 2016-02-19 MED ORDER — MAGNESIUM 100 MG PO TABS: 100.0000 mg | ORAL_TABLET | Freq: Two times a day (BID) | ORAL | Status: DC

## 2016-02-19 MED ORDER — ALPRAZOLAM 0.5 MG PO TABS
0.5000 mg | ORAL_TABLET | Freq: Three times a day (TID) | ORAL | Status: DC | PRN
  Administered 2016-02-20 – 2016-02-21 (×4): 0.5 mg via ORAL
  Filled 2016-02-19 (×4): qty 1

## 2016-02-19 MED ORDER — CALCIUM CARBONATE-VITAMIN D 500-200 MG-UNIT PO TABS
1.0000 | ORAL_TABLET | Freq: Two times a day (BID) | ORAL | Status: DC
  Administered 2016-02-19 – 2016-02-22 (×6): 1 via ORAL
  Filled 2016-02-19 (×6): qty 1

## 2016-02-19 MED ORDER — VITAMIN B-12 1000 MCG PO TABS
1000.0000 ug | ORAL_TABLET | Freq: Every day | ORAL | Status: DC
  Administered 2016-02-19 – 2016-02-22 (×4): 1000 ug via ORAL
  Filled 2016-02-19 (×4): qty 1

## 2016-02-19 MED ORDER — CLONIDINE HCL 0.1 MG PO TABS
0.2000 mg | ORAL_TABLET | Freq: Two times a day (BID) | ORAL | Status: DC
  Administered 2016-02-19 – 2016-02-21 (×5): 0.2 mg via ORAL
  Filled 2016-02-19 (×5): qty 2

## 2016-02-19 NOTE — ED Notes (Signed)
Pt presents to ED via EMS c/o generalized weakness. EMS reports they were called out initially for chest pain. Pt found to be hypotensive when EMS arrived, SBP 70s. Pt states she took x1 SL Nitro this morning because "something did not feel right." C/o heaviness to chest but pt denies pain. Hx HTN, on multiple antihypertensives. Took BP meds this morning. No c/o dizziness, n/v, shortness of breath.

## 2016-02-19 NOTE — ED Notes (Signed)
Floor notified patient is on her way up to the floor

## 2016-02-19 NOTE — ED Provider Notes (Signed)
[facility] Emergency Department Provider Note    First MD Initiated Contact with Patient 02/19/16 1159     (approximate)  I have reviewed the triage vital signs and the nursing notes.   HISTORY  Chief Complaint Weakness    HPI Anita Ward is a 82 y.o. female history of congestive heart without failure as well as CAD status post CABG presents with sudden onset diffuse weakness roughly 2 hours after taking her home antihypertensive medications. Patient denies any chest pain or shortness of breath but felt lightheaded and dizzy. She checked her blood pressure and it was 79/50 reportedly. She then took one nitroglycerin and called EMS. She did take aspirin. Denies any fevers, nausea, vomiting, diarrhea, dysuria. No productive cough. After EMS arrived patient states that she felt better and her repeat blood pressure was normotensive. She currently denies any complaints at this time.   Past Medical History:  Diagnosis Date  . Congestive heart failure (CHF) (HCC)   . Coronary artery disease   . Glaucoma   . HTN (hypertension)   . Osteoporosis     Patient Active Problem List   Diagnosis Date Noted  . MI (myocardial infarction) (HCC) 05/27/2015  . CHF (congestive heart failure) (HCC)   . UTI (urinary tract infection) 03/12/2015  . UTI (lower urinary tract infection) 03/12/2015    Past Surgical History:  Procedure Laterality Date  . CARDIAC SURGERY    . CARDIAC SURGERY    . CORONARY ARTERY BYPASS GRAFT      Prior to Admission medications   Medication Sig Start Date End Date Taking? Authorizing Provider  ALPRAZolam Prudy Feeler) 0.5 MG tablet Take 1 tablet (0.5 mg total) by mouth 3 (three) times daily as needed for sleep or anxiety. 05/17/15 05/16/16  Emily Filbert, MD  aspirin EC 81 MG EC tablet Take 1 tablet (81 mg total) by mouth daily. 05/29/15   Altamese Dilling, MD  Calcium Carbonate-Vitamin D (CALCIUM 600+D) 600-400 MG-UNIT tablet Take 1  tablet by mouth 2 (two) times daily.    Historical Provider, MD  cholecalciferol (VITAMIN D) 1000 UNITS tablet Take 1,000 Units by mouth daily.    Historical Provider, MD  cloNIDine (CATAPRES) 0.2 MG tablet Take 0.2 mg by mouth 2 (two) times daily.    Historical Provider, MD  clopidogrel (PLAVIX) 75 MG tablet Take 1 tablet (75 mg total) by mouth daily with breakfast. 05/29/15   Altamese Dilling, MD  CRANBERRY PO Take 1 capsule by mouth daily.    Historical Provider, MD  furosemide (LASIX) 40 MG tablet Take 1 tablet (40 mg total) by mouth at bedtime. 02/03/16 02/02/17  Myrna Blazer, MD  hydrALAZINE (APRESOLINE) 50 MG tablet Take 1 tablet (50 mg total) by mouth every 6 (six) hours. 05/29/15   Altamese Dilling, MD  levothyroxine (SYNTHROID, LEVOTHROID) 25 MCG tablet Take 25 mcg by mouth daily.     Historical Provider, MD  losartan (COZAAR) 50 MG tablet Take 50 mg by mouth daily.    Historical Provider, MD  Magnesium 100 MG TABS Take 100 mg by mouth 2 (two) times daily.    Historical Provider, MD  nitroGLYCERIN (NITROSTAT) 0.4 MG SL tablet Place 0.4 mg under the tongue every 5 (five) minutes as needed for chest pain.    Historical Provider, MD  potassium chloride (K-DUR) 10 MEQ tablet Take 10 mEq by mouth daily.    Historical Provider, MD  sertraline (ZOLOFT) 25 MG tablet Take 25 mg by mouth daily.  Historical Provider, MD  simvastatin (ZOCOR) 10 MG tablet Take 1 tablet (10 mg total) by mouth daily at 6 PM. 05/29/15   Altamese Dilling, MD  traMADol (ULTRAM) 50 MG tablet Take 50 mg by mouth 3 (three) times daily as needed for moderate pain.    Historical Provider, MD  vitamin B-12 (CYANOCOBALAMIN) 1000 MCG tablet Take 1,000 mcg by mouth daily.    Historical Provider, MD    Allergies Benicar [olmesartan]; Cardura [doxazosin mesylate]; Ciprofloxacin; Fosamax [alendronate sodium]; Lisinopril; Norvasc [amlodipine besylate]; Penicillins; Sulfa antibiotics; and Torsemide  Family  History  Problem Relation Age of Onset  . Congestive Heart Failure Father   . CAD Brother     Social History Social History  Substance Use Topics  . Smoking status: Never Smoker  . Smokeless tobacco: Not on file  . Alcohol use No    Review of Systems Patient denies headaches, rhinorrhea, blurry vision, numbness, shortness of breath, chest pain, edema, cough, abdominal pain, nausea, vomiting, diarrhea, dysuria, fevers, rashes or hallucinations unless otherwise stated above in HPI. ____________________________________________   PHYSICAL EXAM:  VITAL SIGNS: There were no vitals filed for this visit.  Constitutional: Alert and oriented. Elderly and frail appearing Eyes: Conjunctivae are normal. PERRL. EOMI. Head: Atraumatic. Nose: No congestion/rhinnorhea. Mouth/Throat: Mucous membranes are moist.  Oropharynx non-erythematous. Neck: No stridor. Painless ROM. No cervical spine tenderness to palpation Hematological/Lymphatic/Immunilogical: No cervical lymphadenopathy. Cardiovascular: Normal rate, regular rhythm. Grossly normal heart sounds.  Good peripheral circulation. Respiratory: Normal respiratory effort.  No retractions. Lungs CTAB. Gastrointestinal: Soft and nontender. No distention. No abdominal bruits. No CVA tenderness. Musculoskeletal: No lower extremity tenderness nor edema.  No joint effusions. Neurologic:  Normal speech and language. No gross focal neurologic deficits are appreciated. No gait instability. Skin:  Skin is warm, dry and intact. No rash noted. Psychiatric: Mood and affect are normal. Speech and behavior are normal. _________________________________________   LABS (all labs ordered are listed, but only abnormal results are displayed)  Results for orders placed or performed during the hospital encounter of 02/19/16 (from the past 24 hour(s))  CBC     Status: Abnormal   Collection Time: 02/19/16 12:05 PM  Result Value Ref Range   WBC 13.1 (H) 3.6 - 11.0  K/uL   RBC 4.56 3.80 - 5.20 MIL/uL   Hemoglobin 12.2 12.0 - 16.0 g/dL   HCT 16.1 (L) 09.6 - 04.5 %   MCV 76.1 (L) 80.0 - 100.0 fL   MCH 26.7 26.0 - 34.0 pg   MCHC 35.1 32.0 - 36.0 g/dL   RDW 40.9 (H) 81.1 - 91.4 %   Platelets 273 150 - 440 K/uL  Comprehensive metabolic panel     Status: Abnormal   Collection Time: 02/19/16 12:05 PM  Result Value Ref Range   Sodium 122 (L) 135 - 145 mmol/L   Potassium 2.6 (LL) 3.5 - 5.1 mmol/L   Chloride 76 (L) 101 - 111 mmol/L   CO2 37 (H) 22 - 32 mmol/L   Glucose, Bld 121 (H) 65 - 99 mg/dL   BUN 32 (H) 6 - 20 mg/dL   Creatinine, Ser 7.82 (H) 0.44 - 1.00 mg/dL   Calcium 9.3 8.9 - 95.6 mg/dL   Total Protein 7.0 6.5 - 8.1 g/dL   Albumin 3.5 3.5 - 5.0 g/dL   AST 22 15 - 41 U/L   ALT 12 (L) 14 - 54 U/L   Alkaline Phosphatase 65 38 - 126 U/L   Total Bilirubin 1.0 0.3 - 1.2 mg/dL  GFR calc non Af Amer 42 (L) >60 mL/min   GFR calc Af Amer 49 (L) >60 mL/min   Anion gap 9 5 - 15  Troponin I     Status: None   Collection Time: 02/19/16 12:05 PM  Result Value Ref Range   Troponin I <0.03 <0.03 ng/mL  Urinalysis complete, with microscopic ([facility] only)     Status: Abnormal   Collection Time: 02/19/16 12:05 PM  Result Value Ref Range   Color, Urine YELLOW (A) YELLOW   APPearance HAZY (A) CLEAR   Glucose, UA NEGATIVE NEGATIVE mg/dL   Bilirubin Urine NEGATIVE NEGATIVE   Ketones, ur NEGATIVE NEGATIVE mg/dL   Specific Gravity, Urine 1.008 1.005 - 1.030   Hgb urine dipstick NEGATIVE NEGATIVE   pH 7.0 5.0 - 8.0   Protein, ur NEGATIVE NEGATIVE mg/dL   Nitrite NEGATIVE NEGATIVE   Leukocytes, UA 1+ (A) NEGATIVE   RBC / HPF 0-5 0 - 5 RBC/hpf   WBC, UA 6-30 0 - 5 WBC/hpf   Bacteria, UA RARE (A) NONE SEEN   Squamous Epithelial / LPF 0-5 (A) NONE SEEN  Magnesium     Status: None   Collection Time: 02/19/16 12:05 PM  Result Value Ref Range   Magnesium 2.3 1.7 - 2.4 mg/dL  Glucose, capillary     Status: Abnormal   Collection Time: 02/19/16 12:06 PM    Result Value Ref Range   Glucose-Capillary 133 (H) 65 - 99 mg/dL   ____________________________________________  EKG  Time: 11:58  Indication: weakness  Rate:  51  Rhythm: sinus with borderline first degree block,  Axis: normal Other: LBBB unchanged from previous, no sgarbossa criteria, nonspecific changes ____________________________________________  RADIOLOGY  CXR IMPRESSION: 1. No convincing acute cardiopulmonary disease. 2. Chronic prominence of the bronchovascular interstitial markings. Stable mild cardiomegaly and chronic changes from prior CABG surgery.  ____________________________________________   PROCEDURES  Procedure(s) performed: none    Critical Care performed: no ____________________________________________   INITIAL IMPRESSION / ASSESSMENT AND PLAN / ED COURSE  Pertinent labs & imaging results that were available during my care of the patient were reviewed by me and considered in my medical decision making (see chart for details).  DDX: UTI, sepsis, pneumonia, heart failure, ACS, dehydration, medication side effect  Anita Ward is a 82 y.o. who presents to the ED with episode of weakness as well as low blood pressure this morning. Patient arrives to the ED hemodynamically stable and well-appearing. EKG is without evidence of acute ischemia. Patient does have significant heart disease previously documented. Based on her presentation will check labs as well as x-ray and urine due to concern for low blood pressure and possible infection.   Clinical Course  Comment By Time  Patient with acute hyponatremia as well as hypokalemia. Mild leukocytosis with positive leuk esterase on urine. No evidence of pneumonia. I suspect that her hypotensive episode secondary to dehydration and she is currently normotensive. Based on her history of heart disease and frailty factors do feel the patient will require admission for gentle hydration and correction of her  electrolyte abnormalities prior to discharge home.  Have discussed with the patient and available family all diagnostics and treatments performed thus far and all questions were answered to the best of my ability. The patient demonstrates understanding and agreement with plan. Willy Eddy, MD 08/12 1406     ____________________________________________   FINAL CLINICAL IMPRESSION(S) / ED DIAGNOSES  Final diagnoses:  Hypokalemia  Hyponatremia      NEW  MEDICATIONS STARTED DURING THIS VISIT:  New Prescriptions   No medications on file     Note:  This document was prepared using Dragon voice recognition software and may include unintentional dictation errors.    Willy Eddy, MD 02/19/16 1623

## 2016-02-19 NOTE — Progress Notes (Signed)
Elderly lady enters with hypertension. No n/v no c/o chest pain. bp on admission 190/71. She is a/o. Lives alone with some home health and over sight by grandson and friends. Walks well with cane. Pt oriented to room. Food ordered.

## 2016-02-19 NOTE — ED Notes (Signed)
Pt's son [phone], stays in group home and has hx MR. Pt's grandson [phone].

## 2016-02-19 NOTE — Care Management Important Message (Signed)
Important Message  Patient Details  Name: Anita Ward MRN: [id number] Date of Birth: [birth year]   Medicare Important Message Given:  Yes    [name] A, RN 02/19/2016, 3:47 PM

## 2016-02-19 NOTE — H&P (Signed)
[employer] [facility] [facility] [place] [facility]   PATIENT NAME: Anita Ward    MR#:  [id number]  DATE OF BIRTH:  [birth year]   DATE OF ADMISSION:  02/19/2016  PRIMARY CARE PHYSICIAN: Barbette Reichmann, MD  Cardiology Gwen Pounds  REQUESTING/REFERRING PHYSICIAN: Roxan Hockey  CHIEF COMPLAINT:   Chief Complaint  Patient presents with  . Weakness    HISTORY OF PRESENT ILLNESS:  Anita Ward  is a 82 y.o. female with a known history of Essential hypertension, diastolic congestive heart failure who is presenting after an episode of chest tightness. She states 1 day duration chest tightness retrosternal in location and nonradiating intensity 4-5/10. She states she just felt "funny" took a nitroglycerin and then to your blood pressure was noted to be in the 70s thus called EMS. On arrival her blood pressure has improved through routine workup found to have a low sodium of 122.  PAST MEDICAL HISTORY:   Past Medical History:  Diagnosis Date  . Congestive heart failure (CHF) (HCC)   . Coronary artery disease   . Glaucoma   . HTN (hypertension)   . Osteoporosis     PAST SURGICAL HISTORY:   Past Surgical History:  Procedure Laterality Date  . CARDIAC SURGERY    . CARDIAC SURGERY    . CORONARY ARTERY BYPASS GRAFT      SOCIAL HISTORY:   Social History  Substance Use Topics  . Smoking status: Never Smoker  . Smokeless tobacco: Never Used  . Alcohol use No    FAMILY HISTORY:   Family History  Problem Relation Age of Onset  . Congestive Heart Failure Father   . CAD Brother     DRUG ALLERGIES:   Allergies  Allergen Reactions  . Benicar [Olmesartan] Other (See Comments)    Reaction:  Unknown   . Cardura [Doxazosin Mesylate] Other (See Comments)    Reaction:  Unknown   . Ciprofloxacin Other (See Comments)    Reaction:  Unknown  . Fosamax [Alendronate Sodium] Other (See Comments)    Reaction:  Unknown  . Lisinopril Other (See Comments)    Reaction:  Unknown   .  Norvasc [Amlodipine Besylate] Other (See Comments)    Reaction:  Unknown   . Penicillins Other (See Comments)    Reaction:  Unknown   . Sulfa Antibiotics Other (See Comments)    Reaction:  Unknown   . Torsemide Anxiety    REVIEW OF SYSTEMS:  REVIEW OF SYSTEMS:  CONSTITUTIONAL: Denies fevers, chills, fatigue, weakness.  EYES: Denies blurred vision, double vision, or eye pain.  EARS, NOSE, THROAT: Denies tinnitus, ear pain, hearing loss.  RESPIRATORY: denies cough, shortness of breath, wheezing  CARDIOVASCULAR: Denies chest pain, palpitations, edema. Positive chest tightness GASTROINTESTINAL: Denies nausea, vomiting, diarrhea, abdominal pain.  GENITOURINARY: Denies dysuria, hematuria.  ENDOCRINE: Denies nocturia or thyroid problems. HEMATOLOGIC AND LYMPHATIC: Denies easy bruising or bleeding.  SKIN: Denies rash or lesions.  MUSCULOSKELETAL: Denies pain in neck, back, shoulder, knees, hips, or further arthritic symptoms.  NEUROLOGIC: Denies paralysis, paresthesias.  PSYCHIATRIC: Denies anxiety or depressive symptoms. Otherwise full review of systems performed by me is negative.   MEDICATIONS AT HOME:   Prior to Admission medications   Medication Sig Start Date End Date Taking? Authorizing Provider  ALPRAZolam Prudy Feeler) 0.5 MG tablet Take 1 tablet (0.5 mg total) by mouth 3 (three) times daily as needed for sleep or anxiety. 05/17/15 05/16/16  Emily Filbert, MD  aspirin EC 81 MG EC tablet Take 1 tablet (81 mg  total) by mouth daily. 05/29/15   Altamese Dilling, MD  Calcium Carbonate-Vitamin D (CALCIUM 600+D) 600-400 MG-UNIT tablet Take 1 tablet by mouth 2 (two) times daily.    Historical Provider, MD  cholecalciferol (VITAMIN D) 1000 UNITS tablet Take 1,000 Units by mouth daily.    Historical Provider, MD  cloNIDine (CATAPRES) 0.2 MG tablet Take 0.2 mg by mouth 2 (two) times daily.    Historical Provider, MD  clopidogrel (PLAVIX) 75 MG tablet Take 1 tablet (75 mg total) by mouth  daily with breakfast. 05/29/15   Altamese Dilling, MD  CRANBERRY PO Take 1 capsule by mouth daily.    Historical Provider, MD  furosemide (LASIX) 40 MG tablet Take 1 tablet (40 mg total) by mouth at bedtime. 02/03/16 02/02/17  Myrna Blazer, MD  hydrALAZINE (APRESOLINE) 50 MG tablet Take 1 tablet (50 mg total) by mouth every 6 (six) hours. 05/29/15   Altamese Dilling, MD  levothyroxine (SYNTHROID, LEVOTHROID) 25 MCG tablet Take 25 mcg by mouth daily.     Historical Provider, MD  losartan (COZAAR) 50 MG tablet Take 50 mg by mouth daily.    Historical Provider, MD  Magnesium 100 MG TABS Take 100 mg by mouth 2 (two) times daily.    Historical Provider, MD  nitroGLYCERIN (NITROSTAT) 0.4 MG SL tablet Place 0.4 mg under the tongue every 5 (five) minutes as needed for chest pain.    Historical Provider, MD  potassium chloride (K-DUR) 10 MEQ tablet Take 10 mEq by mouth daily.    Historical Provider, MD  sertraline (ZOLOFT) 25 MG tablet Take 25 mg by mouth daily.    Historical Provider, MD  simvastatin (ZOCOR) 10 MG tablet Take 1 tablet (10 mg total) by mouth daily at 6 PM. 05/29/15   Altamese Dilling, MD  traMADol (ULTRAM) 50 MG tablet Take 50 mg by mouth 3 (three) times daily as needed for moderate pain.    Historical Provider, MD  vitamin B-12 (CYANOCOBALAMIN) 1000 MCG tablet Take 1,000 mcg by mouth daily.    Historical Provider, MD      VITAL SIGNS:  Blood pressure (!) 153/69, pulse (!) 59, temperature 98.1 F (36.7 C), temperature source Oral, resp. rate 16, height 5\' 1"  (1.549 m), weight 56.7 kg (125 lb), SpO2 100 %.  PHYSICAL EXAMINATION:  VITAL SIGNS: Vitals:   02/19/16 1330 02/19/16 1400  BP: (!) 146/53 (!) 153/69  Pulse: (!) 54 (!) 59  Resp: 15 16  Temp:     GENERAL:82 y.o.female currently in no acute distress.  HEAD: Normocephalic, atraumatic.  EYES: Pupils equal, round, reactive to light. Extraocular muscles intact. No scleral icterus.  MOUTH: Moist mucosal  membrane. Dentition intact. No abscess noted.  EAR, NOSE, THROAT: Clear without exudates. No external lesions.  NECK: Supple. No thyromegaly. No nodules. No JVD.  PULMONARY: Clear to ascultation, without wheeze rails or rhonci. No use of accessory muscles, Good respiratory effort. good air entry bilaterally CHEST: Nontender to palpation.  CARDIOVASCULAR: S1 and S2. Regular rate and rhythm. No murmurs, rubs, or gallops. No edema. Pedal pulses 2+ bilaterally.  GASTROINTESTINAL: Soft, nontender, nondistended. No masses. Positive bowel sounds. No hepatosplenomegaly.  MUSCULOSKELETAL: No swelling, clubbing, or edema. Range of motion full in all extremities.  NEUROLOGIC: Cranial nerves II through XII are intact. No gross focal neurological deficits. Sensation intact. Reflexes intact.  SKIN: No ulceration, lesions, rashes, or cyanosis. Skin warm and dry. Turgor intact.  PSYCHIATRIC: Mood, affect within normal limits. The patient is awake, alert and oriented x  3. Insight, judgment intact.    LABORATORY PANEL:   CBC  Recent Labs Lab 02/19/16 1205  WBC 13.1*  HGB 12.2  HCT 34.7*  PLT 273   ------------------------------------------------------------------------------------------------------------------  Chemistries   Recent Labs Lab 02/19/16 1205  NA 122*  K 2.6*  CL 76*  CO2 37*  GLUCOSE 121*  BUN 32*  CREATININE 1.09*  CALCIUM 9.3  AST 22  ALT 12*  ALKPHOS 65  BILITOT 1.0   ------------------------------------------------------------------------------------------------------------------  Cardiac Enzymes  Recent Labs Lab 02/19/16 1205  TROPONINI <0.03   ------------------------------------------------------------------------------------------------------------------  RADIOLOGY:  Dg Chest 2 View  Result Date: 02/19/2016 CLINICAL DATA:  Pt presents to ED via EMS c/o generalized weakness. EMS reports they were called out initially for chest pain. Pt found to be  hypotensive when EMS arrived, SBP 70s. Pt states she took x1 SL Nitro this morning because "something did not feel right." C/o heaviness to chest but pt denies pain. Hx HTN, on multiple antihypertensives. Took BP meds this morning. No c/o dizziness, shortness of breath. EXAM: CHEST  2 VIEW COMPARISON:  02/03/2016 FINDINGS: Changes from CABG surgery are stable. Cardiac silhouette is mildly enlarged. No mediastinal or hilar masses or evidence of adenopathy. Lungs are mildly hyperexpanded. There are prominent bronchovascular and interstitial markings, decreased in thickness when compared to the prior exam. No lung consolidation. No pleural effusion or pneumothorax. Skeletal structures are demineralized. There is a chronic compression fracture at the thoracolumbar junction. IMPRESSION: 1. No convincing acute cardiopulmonary disease. 2. Chronic prominence of the bronchovascular interstitial markings. Stable mild cardiomegaly and chronic changes from prior CABG surgery. Electronically Signed   By: Amie Portland M.D.   On: 02/19/2016 12:53    EKG:   Orders placed or performed during the hospital encounter of 02/19/16  . EKG 12-Lead  . EKG 12-Lead    IMPRESSION AND PLAN:   82 year old Caucasian female history of essential hypertension, diastolic congestive heart failure who is presenting with chest tightness and hyponatremia  1. Chest pain, central: Initiate aspirin and statin therapy, admitted to telemetry, trend cardiac enzymes 3,  if continued elevation will initiate heparin drip ,nitroglycerin when necessary, morphine when necessary, consult cardiology follows with [facility]  2. Hyponatremic: Euvolemic versus hypovolemic on exam hold Lasix, at IV fluid hydration normal saline follow sodium level III. Essential hypertension hold Lasix continue with other medications 4. Coronary artery disease continue aspirin and Plavix 5. Venous thromboembolism prophylactic: Lovenox    All the records are reviewed and  case discussed with ED provider. Management plans discussed with the patient, family and they are in agreement.  CODE STATUS:  DO NOT RESUSCITATE  TOTAL TIME TAKING CARE OF THIS PATIENT:  33 minutes.    Hower,  Mardi Mainland.D on 02/19/2016 at 2:28 PM  Between 7am to 6pm - Pager - [phone]  After 6pm: House Pager: - [phone]  [facility]  Office  [phone]  CC: Primary care physician; Barbette Reichmann, MD

## 2016-02-20 LAB — BASIC METABOLIC PANEL
Anion gap: 6 (ref 5–15)
BUN: 24 mg/dL — ABNORMAL HIGH (ref 6–20)
CALCIUM: 9 mg/dL (ref 8.9–10.3)
CHLORIDE: 86 mmol/L — AB (ref 101–111)
CO2: 34 mmol/L — AB (ref 22–32)
CREATININE: 0.84 mg/dL (ref 0.44–1.00)
GFR calc Af Amer: >60 mL/min (ref >60)
GFR calc non Af Amer: 58 mL/min — ABNORMAL LOW (ref >60)
GLUCOSE: 108 mg/dL — AB (ref 65–99)
Potassium: 3.2 mmol/L — ABNORMAL LOW (ref 3.5–5.1)
Sodium: 126 mmol/L — ABNORMAL LOW (ref 135–145)

## 2016-02-20 LAB — TROPONIN I: Troponin I: <0.03 ng/mL (ref <0.03)

## 2016-02-20 MED ORDER — SIMETHICONE 80 MG PO CHEW
80.0000 mg | CHEWABLE_TABLET | Freq: Four times a day (QID) | ORAL | Status: DC
  Administered 2016-02-20 – 2016-02-22 (×6): 80 mg via ORAL
  Filled 2016-02-20 (×5): qty 1

## 2016-02-20 MED ORDER — ENOXAPARIN SODIUM 40 MG/0.4ML ~~LOC~~ SOLN
40.0000 mg | SUBCUTANEOUS | Status: DC
  Administered 2016-02-20 – 2016-02-21 (×2): 40 mg via SUBCUTANEOUS
  Filled 2016-02-20 (×2): qty 0.4

## 2016-02-20 MED ORDER — LABETALOL HCL 5 MG/ML IV SOLN
10.0000 mg | Freq: Four times a day (QID) | INTRAVENOUS | Status: DC | PRN
  Administered 2016-02-20 – 2016-02-21 (×2): 10 mg via INTRAVENOUS
  Filled 2016-02-20 (×2): qty 4

## 2016-02-20 NOTE — Progress Notes (Signed)
[employer] [facility] [place] [facility]   PATIENT NAME: Anita Ward    MR#:  [id number]  DATE OF BIRTH:  11/06/1919  SUBJECTIVE:   Patient here due to atypical chest pain, noted to be hyponatremic. Sodium is improved. No chest pain today. Was hypotensive yesterday but that's also improved.  REVIEW OF SYSTEMS:    Review of Systems  Constitutional: Negative for chills and fever.  HENT: Negative for congestion and tinnitus.   Eyes: Negative for blurred vision and double vision.  Respiratory: Negative for cough, shortness of breath and wheezing.   Cardiovascular: Negative for chest pain, orthopnea and PND.  Gastrointestinal: Negative for abdominal pain, diarrhea, nausea and vomiting.  Genitourinary: Negative for dysuria and hematuria.  Neurological: Negative for dizziness, sensory change and focal weakness.  All other systems reviewed and are negative.   Nutrition: Heart Healthy Tolerating Diet: Yes Tolerating PT: Await Eval.    DRUG ALLERGIES:   Allergies  Allergen Reactions  . Benicar [Olmesartan] Other (See Comments)    Reaction:  Unknown   . Cardura [Doxazosin Mesylate] Other (See Comments)    Reaction:  Unknown   . Ciprofloxacin Other (See Comments)    Reaction:  Unknown  . Fosamax [Alendronate Sodium] Other (See Comments)    Reaction:  Unknown  . Lisinopril Other (See Comments)    Reaction:  Unknown   . Norvasc [Amlodipine Besylate] Other (See Comments)    Reaction:  Unknown   . Penicillins Other (See Comments)    Reaction:  Unknown   . Sulfa Antibiotics Other (See Comments)    Reaction:  Unknown   . Torsemide Anxiety    VITALS:  Blood pressure (!) 163/52, pulse 61, temperature 97.7 F (36.5 C), temperature source Axillary, resp. rate 16, height  (1.575 m), weight 55.9 kg (123 lb 4.8 oz), SpO2 97 %.  PHYSICAL EXAMINATION:   Physical Exam  GENERAL:  82 y.o.-year-old patient lying in the bed in no acute distress.  EYES: Pupils equal,  round, reactive to light and accommodation. No scleral icterus. Extraocular muscles intact.  HEENT: Head atraumatic, normocephalic. Oropharynx and nasopharynx clear.  NECK:  Supple, no jugular venous distention. No thyroid enlargement, no tenderness.  LUNGS: Normal breath sounds bilaterally, no wheezing, rales, rhonchi. No use of accessory muscles of respiration.  CARDIOVASCULAR: S1, S2 normal. No murmurs, rubs, or gallops.  ABDOMEN: Soft, nontender, nondistended. Bowel sounds present. No organomegaly or mass.  EXTREMITIES: No cyanosis, clubbing or edema b/l.    NEUROLOGIC: Cranial nerves II through XII are intact. No focal Motor or sensory deficits b/l. Globally weak.  PSYCHIATRIC: The patient is alert and oriented x 3.  SKIN: No obvious rash, lesion, or ulcer.    LABORATORY PANEL:   CBC  Recent Labs Lab 02/19/16 1205  WBC 13.1*  HGB 12.2  HCT 34.7*  PLT 273   ------------------------------------------------------------------------------------------------------------------  Chemistries   Recent Labs Lab 02/19/16 1205 02/20/16 0105  NA 122* 126*  K 2.6* 3.2*  CL 76* 86*  CO2 37* 34*  GLUCOSE 121* 108*  BUN 32* 24*  CREATININE 1.09* 0.84  CALCIUM 9.3 9.0  MG 2.3  --   AST 22  --   ALT 12*  --   ALKPHOS 65  --   BILITOT 1.0  --    ------------------------------------------------------------------------------------------------------------------  Cardiac Enzymes  Recent Labs Lab 02/20/16 1301  TROPONINI <0.03   ------------------------------------------------------------------------------------------------------------------  RADIOLOGY:  Dg Chest 2 View  Result Date: 02/19/2016 CLINICAL DATA:  Pt presents to  ED via EMS c/o generalized weakness. EMS reports they were called out initially for chest pain. Pt found to be hypotensive when EMS arrived, SBP 70s. Pt states she took x1 SL Nitro this morning because "something did not feel right." C/o heaviness to chest  but pt denies pain. Hx HTN, on multiple antihypertensives. Took BP meds this morning. No c/o dizziness, shortness of breath. EXAM: CHEST  2 VIEW COMPARISON:  02/03/2016 FINDINGS: Changes from CABG surgery are stable. Cardiac silhouette is mildly enlarged. No mediastinal or hilar masses or evidence of adenopathy. Lungs are mildly hyperexpanded. There are prominent bronchovascular and interstitial markings, decreased in thickness when compared to the prior exam. No lung consolidation. No pleural effusion or pneumothorax. Skeletal structures are demineralized. There is a chronic compression fracture at the thoracolumbar junction. IMPRESSION: 1. No convincing acute cardiopulmonary disease. 2. Chronic prominence of the bronchovascular interstitial markings. Stable mild cardiomegaly and chronic changes from prior CABG surgery. Electronically Signed   By: Amie Portland M.D.   On: 02/19/2016 12:53     ASSESSMENT AND PLAN:   82 year old female with past medical history of osteoporosis, hypertension, glaucoma, history of CHF, coronary artery disease presents to the hospital due to weakness and noted to be hyponatremic.  1. Hyponatremia-hypovolemic hypotonic nature. -Continue IV fluids and sodium is improving.  2. Atypical chest pain-likely musculoskeletal in nature. Cardiac markers 3 have been negative. -We'll DC telemetry.  3. Essential hypertension-continue clonidine, hydralazine, losartan.  4. Hyperlipidemia - cont. Simvastatin  5. Hypothyroidism - cont. Synthroid.   6. Depression - cont. Zoloft.   Await PT eval and likely d/c home tomorrow.   All the records are reviewed and case discussed with Care Management/Social Workerr. Management plans discussed with the patient, family and they are in agreement.  CODE STATUS: DNR  DVT Prophylaxis: Lovenox  TOTAL TIME TAKING CARE OF THIS PATIENT: 25 minutes.   POSSIBLE D/C IN 1-2 DAYS, DEPENDING ON CLINICAL CONDITION.   Houston Siren M.D on  02/20/2016 at 1:35 PM  Between 7am to 6pm - Pager - [phone]  After 6pm go to [email or web address] - password [email or web address] [facility]  [place] [facility]  Office  [phone]  CC: Primary care physician; Barbette Reichmann, MD

## 2016-02-20 NOTE — Progress Notes (Signed)
Pt ambulated in hallway. tol well. Gait fairly steady with cane. Tele d/c'd as ordered.pt c/o gas. Simethicone ordered. Otherwise she has been painfree. Continent and of good appetite. Family in to visit.

## 2016-02-20 NOTE — Progress Notes (Signed)
Summoned to pt room. She felt her bp was high. "it happens this time most days." bp 200/71. Dr Geralynn Rile notified. 10 mg labetalol given iv. bp rechecked and was 180/51. Will continue to monitor frequently

## 2016-02-21 LAB — BASIC METABOLIC PANEL
ANION GAP: 7 (ref 5–15)
BUN: 17 mg/dL (ref 6–20)
CHLORIDE: 88 mmol/L — AB (ref 101–111)
CO2: 33 mmol/L — AB (ref 22–32)
CREATININE: 0.8 mg/dL (ref 0.44–1.00)
Calcium: 8.9 mg/dL (ref 8.9–10.3)
GFR calc non Af Amer: >60 mL/min (ref >60)
Glucose, Bld: 98 mg/dL (ref 65–99)
Potassium: 2.7 mmol/L — CL (ref 3.5–5.1)
Sodium: 128 mmol/L — ABNORMAL LOW (ref 135–145)

## 2016-02-21 LAB — MAGNESIUM: Magnesium: 1.9 mg/dL (ref 1.7–2.4)

## 2016-02-21 MED ORDER — HYDRALAZINE HCL 20 MG/ML IJ SOLN
10.0000 mg | Freq: Four times a day (QID) | INTRAMUSCULAR | Status: DC | PRN
  Administered 2016-02-21: 15:00:00 10 mg via INTRAVENOUS
  Filled 2016-02-21: qty 1

## 2016-02-21 MED ORDER — POTASSIUM CHLORIDE CRYS ER 20 MEQ PO TBCR
20.0000 meq | EXTENDED_RELEASE_TABLET | Freq: Two times a day (BID) | ORAL | Status: AC
  Administered 2016-02-21: 21:00:00 20 meq via ORAL
  Filled 2016-02-21 (×2): qty 1

## 2016-02-21 MED ORDER — CLONIDINE HCL 0.1 MG PO TABS
0.3000 mg | ORAL_TABLET | Freq: Two times a day (BID) | ORAL | Status: DC
  Administered 2016-02-21 – 2016-02-22 (×2): 0.3 mg via ORAL
  Filled 2016-02-21 (×2): qty 3

## 2016-02-21 MED ORDER — POTASSIUM CHLORIDE 20 MEQ/15ML (10%) PO SOLN: 20.0000 meq | Freq: Once | ORAL | Status: DC

## 2016-02-21 MED ORDER — LOSARTAN POTASSIUM 50 MG PO TABS
50.0000 mg | ORAL_TABLET | Freq: Once | ORAL | Status: AC
  Administered 2016-02-21: 50 mg via ORAL
  Filled 2016-02-21: qty 1

## 2016-02-21 MED ORDER — LOSARTAN POTASSIUM 50 MG PO TABS
100.0000 mg | ORAL_TABLET | Freq: Every day | ORAL | Status: DC
  Administered 2016-02-22: 09:00:00 100 mg via ORAL
  Filled 2016-02-21: qty 2

## 2016-02-21 MED ORDER — POTASSIUM CHLORIDE 10 MEQ/100ML IV SOLN
10.0000 meq | INTRAVENOUS | Status: AC
  Administered 2016-02-21 (×4): 10 meq via INTRAVENOUS
  Filled 2016-02-21 (×4): qty 100

## 2016-02-21 MED ORDER — POTASSIUM CHLORIDE 20 MEQ/15ML (10%) PO SOLN
40.0000 meq | Freq: Once | ORAL | Status: AC
  Administered 2016-02-21: 40 meq via ORAL
  Filled 2016-02-21: qty 30

## 2016-02-21 MED ORDER — OXYCODONE HCL 5 MG PO TABS: 5.0000 mg | ORAL_TABLET | ORAL | Status: DC | PRN

## 2016-02-21 NOTE — Progress Notes (Signed)
Pt c/o of tightness in chest and difficulty breathing.  Repositioned patient.  Checked VS.

## 2016-02-21 NOTE — Progress Notes (Signed)
PT Cancellation Note  Patient Details Name: PATRYCJA BAGSHAW MRN: [id number] DOB: 02/06/1920   Cancelled Treatment:    Reason Eval/Treat Not Completed: Medical issues which prohibited therapy;Other (comment) Consult received, chart reviewed. PT attempted evaluation, pt BP at rest: 205/176, RN notified who said she notified MD, pt not medically appropriate for PT at this time. Pt requesting BP meds, per RN unable to give any more at this time. PT will re-attempt at next date when medically stable.   Thereasa Parkin 02/21/2016, 2:06 PM Thereasa Parkin, SPT [phone]

## 2016-02-21 NOTE — Progress Notes (Signed)
Critical potassium value reported to MD. Acknowledged. New orders to be placed. Will continue to monitor.

## 2016-02-21 NOTE — Care Management (Signed)
Patient admitted from home for chest pain.  found to have low sodium.  This day her potassium is 2.7 and to receive three runs of IV potassium.  Having to infuse slowly due to burning.  Patient ives at home alone with the support of a grandson.  She has an adult child in a group home that comes to stay on weekends.  Patient says she is is independent in her adls, no issues accessing medical care , transportation or obtaining medications.  does not have any dme in the home. Her grandson will transport home.

## 2016-02-21 NOTE — Care Management Important Message (Signed)
Important Message  Patient Details  Name: Anita Ward MRN: [id number] Date of Birth: [birth year]   Medicare Important Message Given:  Yes    Eber Hong, RN 02/21/2016, 10:00 AM

## 2016-02-21 NOTE — Progress Notes (Signed)
[employer] - [place] at [facility]   PATIENT NAME: Anita Ward    MR#:  [id number]  DATE OF BIRTH:  04-21-1920  SUBJECTIVE:   Patient here due to atypical chest pain, noted to be hyponatremic. Sodium improving. No chest pain today.   Remains Hypertensive.  BP labile.    REVIEW OF SYSTEMS:    Review of Systems  Constitutional: Negative for chills and fever.  HENT: Negative for congestion and tinnitus.   Eyes: Negative for blurred vision and double vision.  Respiratory: Negative for cough, shortness of breath and wheezing.   Cardiovascular: Negative for chest pain, orthopnea and PND.  Gastrointestinal: Negative for abdominal pain, diarrhea, nausea and vomiting.  Genitourinary: Negative for dysuria and hematuria.  Neurological: Negative for dizziness, sensory change and focal weakness.  All other systems reviewed and are negative.   Nutrition: Heart Healthy Tolerating Diet: Yes Tolerating PT: Await Eval.    DRUG ALLERGIES:   Allergies  Allergen Reactions  . Benicar [Olmesartan] Other (See Comments)    Reaction:  Unknown   . Cardura [Doxazosin Mesylate] Other (See Comments)    Reaction:  Unknown   . Ciprofloxacin Other (See Comments)    Reaction:  Unknown  . Fosamax [Alendronate Sodium] Other (See Comments)    Reaction:  Unknown  . Lisinopril Other (See Comments)    Reaction:  Unknown   . Norvasc [Amlodipine Besylate] Other (See Comments)    Reaction:  Unknown   . Penicillins Other (See Comments)    Reaction:  Unknown   . Sulfa Antibiotics Other (See Comments)    Reaction:  Unknown   . Torsemide Anxiety    VITALS:  Blood pressure (!) 196/65, pulse 65, temperature 97.9 F (36.6 C), temperature source Oral, resp. rate 20, height 5\' 2"  (1.575 m), weight 55.9 kg (123 lb 4.8 oz), SpO2 100 %.  PHYSICAL EXAMINATION:   Physical Exam  GENERAL:  82 y.o.-year-old patient lying in the bed in no acute distress.  EYES: Pupils equal, round, reactive to  light and accommodation. No scleral icterus. Extraocular muscles intact.  HEENT: Head atraumatic, normocephalic. Oropharynx and nasopharynx clear.  NECK:  Supple, no jugular venous distention. No thyroid enlargement, no tenderness.  LUNGS: Normal breath sounds bilaterally, no wheezing, rales, rhonchi. No use of accessory muscles of respiration.  CARDIOVASCULAR: S1, S2 normal. II/VI SEM at LSB, No rubs, or gallops.  ABDOMEN: Soft, nontender, nondistended. Bowel sounds present. No organomegaly or mass.  EXTREMITIES: No cyanosis, clubbing or edema b/l.    NEUROLOGIC: Cranial nerves II through XII are intact. No focal Motor or sensory deficits b/l.  PSYCHIATRIC: The patient is alert and oriented x 3.  SKIN: No obvious rash, lesion, or ulcer.    LABORATORY PANEL:   CBC  Recent Labs Lab 02/19/16 1205  WBC 13.1*  HGB 12.2  HCT 34.7*  PLT 273   ------------------------------------------------------------------------------------------------------------------  Chemistries   Recent Labs Lab 02/19/16 1205  02/21/16 0435  NA 122*  < > 128*  K 2.6*  < > 2.7*  CL 76*  < > 88*  CO2 37*  < > 33*  GLUCOSE 121*  < > 98  BUN 32*  < > 17  CREATININE 1.09*  < > 0.80  CALCIUM 9.3  < > 8.9  MG 2.3  --  1.9  AST 22  --   --   ALT 12*  --   --   ALKPHOS 65  --   --   BILITOT 1.0  --   --   < > =  values in this interval not displayed. ------------------------------------------------------------------------------------------------------------------  Cardiac Enzymes  Recent Labs Lab 02/20/16 1301  TROPONINI <0.03   ------------------------------------------------------------------------------------------------------------------  RADIOLOGY:  No results found.   ASSESSMENT AND PLAN:   82 year old female with past medical history of osteoporosis, hypertension, glaucoma, history of CHF, coronary artery disease presents to the hospital due to weakness and noted to be hyponatremic.  1.  Hyponatremia-hypovolemic hypotonic nature. - improved w/ IV fluids and will monitor.    2. Atypical chest pain-likely musculoskeletal in nature. Cardiac markers 3 have been negative. - resolved.   3. Accelerated HTN - BP quite labile.  Will advance Clonidine, cont. Hydralazine.  Resume Losartan.  - cont. PRN labetolol and will monitor.   4. Hypokalemia - cont. To replace and repeat in a.m.  - check Mg. Level.   5. Hyperlipidemia - cont. Simvastatin  6. Hypothyroidism - cont. Synthroid.   7. Depression - cont. Zoloft.   Await PT Eval.  Possible d/c home tomorrow if potassium improved and BP improved.   All the records are reviewed and case discussed with Care Management/Social Workerr. Management plans discussed with the patient, family and they are in agreement.  CODE STATUS: DNR  DVT Prophylaxis: Lovenox  TOTAL TIME TAKING CARE OF THIS PATIENT: 30 minutes.   POSSIBLE D/C IN 1-2 DAYS, DEPENDING ON CLINICAL CONDITION.   Houston SirenSAINANI,VIVEK J M.D on 02/21/2016 at 1:08 PM  Between 7am to 6pm - Pager - [phone]  After 6pm go to [email or web address] - password [email or web address] [facility]  [place] [facility]  Office  [phone]  CC: Primary care physician; Barbette ReichmannHANDE,VISHWANATH, MD

## 2016-02-21 NOTE — Progress Notes (Signed)
BP elevated all morning. 200/ 60 or 70.  Got labetelol at 0700. Got hydralizine, catapres at 0900. Orders for Cozaar for now.  Transferring to 1C as patient is not a telemetry patient and beds are needed.

## 2016-02-21 NOTE — Progress Notes (Signed)
Dr  Cherlynn Kaiser notified of increase in BP 193/56  HR 70.  Unable to give Labetalol  Due to HR.  Hydralazine orderd

## 2016-02-22 LAB — BASIC METABOLIC PANEL
ANION GAP: 12 (ref 5–15)
BUN: 12 mg/dL (ref 6–20)
CALCIUM: 8.7 mg/dL — AB (ref 8.9–10.3)
CO2: 23 mmol/L (ref 22–32)
Chloride: 92 mmol/L — ABNORMAL LOW (ref 101–111)
Creatinine, Ser: 0.74 mg/dL (ref 0.44–1.00)
GFR calc non Af Amer: >60 mL/min (ref >60)
Glucose, Bld: 144 mg/dL — ABNORMAL HIGH (ref 65–99)
POTASSIUM: 3.7 mmol/L (ref 3.5–5.1)
Sodium: 127 mmol/L — ABNORMAL LOW (ref 135–145)

## 2016-02-22 MED ORDER — LOSARTAN POTASSIUM 100 MG PO TABS: 100.0000 mg | ORAL_TABLET | Freq: Every day | ORAL | 1 refills | Status: DC

## 2016-02-22 MED ORDER — CLONIDINE HCL 0.3 MG PO TABS: 0.3000 mg | ORAL_TABLET | Freq: Two times a day (BID) | ORAL | 11 refills | Status: AC

## 2016-02-22 NOTE — Progress Notes (Signed)
Physical Therapy Evaluation Patient Details Name: Anita SalinesDorothy H Koroma MRN: [id number] DOB: 11/08/1919 Today's Date: 02/22/2016   History of Present Illness  Pt is a 82 y/o female admitted for chest pains. Per pt notes, she felt sudden weakness 2 hours after taking antihypertension medications. She also felt light headed and dizzy, had a drop in BP (pt reports 79/50). She then took one nitroglycerin and called EMS. PMH includes HTN, CHF, CAD s/p CABG, osteoporosis, and hx of MI.   Clinical Impression  Pt is a pleasant and motivated 82 y/o female who presents with generalized weakness. PLOF: Pt was independent with household ambulation, uses SPC sometimes in house and always in the community. Pt is independent with ADLs, is able to go out into community with grandson to get groceries. Pt is modified independent with bed mobility, min guard with transfers, and min assist with ambulation (8315ft with SPC, 6315ft with RW). Pt demonstrates slightly unsteady gait with SPC, improved stability with RW. One instance of LOB, min assist to regain balance. Educated on importance of using RW until pt feels stable enough to use SPC. Pt requires min assist/guard for toilet transfer due to low height of toilet. Pt reports she has used HHPT in the past and it was very beneficial for her. Pt will benefit from skilled PT services in order to improve strength, balance, endurance, functional mobility, and safety with DME use. At this time pt is appropriate for HHPT to address deficits.     Follow Up Recommendations Home health PT    Equipment Recommendations  None recommended by PT (Pt reports she has RW at home)    Recommendations for Other Services       Precautions / Restrictions Precautions Precautions: Fall Restrictions Weight Bearing Restrictions: No      Mobility  Bed Mobility Overal bed mobility: Modified Independent             General bed mobility comments: Pt able to move from supine to sit with  minimal verbal cueing from PT. Able to scoot to EOB using B UE.  Transfers Overall transfer level: Needs assistance Equipment used: Straight cane Transfers: Sit to/from Stand Sit to Stand: Min guard         General transfer comment: Pt is min guard with sit/stand, no unsteadiess upon standing. Pt demonstrates proper technique.  Ambulation/Gait Ambulation/Gait assistance: Min assist Ambulation Distance (Feet): 15 Feet Assistive device: Straight cane Gait Pattern/deviations: WFL(Within Functional Limits);Trunk flexed   Gait velocity interpretation: Below normal speed for age/gender General Gait Details: Pt demonstrates slightly unsteady gait with SPC, increased trunk flexion. One instance of slight LOB, pt able to regain balance with min assist from PT.   Stairs            Wheelchair Mobility    Modified Rankin (Stroke Patients Only)       Balance Overall balance assessment: Needs assistance Sitting-balance support: No upper extremity supported;Feet supported Sitting balance-Leahy Scale: Normal     Standing balance support: Single extremity supported Standing balance-Leahy Scale: Good Standing balance comment: Pt able to maintain standing balance with single UE support on SPC.                             Pertinent Vitals/Pain Pain Assessment: No/denies pain    Home Living Family/patient expects to be discharged to:: Private residence Living Arrangements: Alone Available Help at Discharge: Family;Friend(s);Available PRN/intermittently Type of Home: House Home Access: Stairs to enter  Entrance Stairs-Rails:  (Pt had handle on both sides that she can hold on to) Entrance Stairs-Number of Steps: 1 Home Layout: One level Home Equipment: Walker - 2 wheels;Cane - single point;Grab bars - tub/shower Additional Comments: Pt's grandson and wife come to check on pt consistently. Pt's adult son lives in a group home but comes home on the weekends.     Prior  Function Level of Independence: Independent with assistive device(s)         Comments: Pt reports she is independent with household ambulation. Uses SPC sometimes in the house and always in the community. Pt is independent with ADLs, is able to go out into community with grandson to get groceries and such.      Hand Dominance        Extremity/Trunk Assessment   Upper Extremity Assessment: Overall WFL for tasks assessed (UE grossly 4+/5)           Lower Extremity Assessment: Overall WFL for tasks assessed (LE grossly 4/5)         Communication   Communication: HOH  Cognition Arousal/Alertness: Awake/alert Behavior During Therapy: WFL for tasks assessed/performed Overall Cognitive Status: Within Functional Limits for tasks assessed                      General Comments      Exercises Other Exercises Other Exercises: Gait training x 15 ft with RW. Verbal and tactile cueing to decrease neck and trunk flexion and to keep RW close to body for safety. Pt demonstrates improvement with stability with use of RW. Other Exercises: Pt taken to bathroom with SPC, slightly unsteady gait. Requires min assist for toilet transfer and for hygiene.       Assessment/Plan    PT Assessment Patient needs continued PT services  PT Diagnosis Abnormality of gait;Generalized weakness   PT Problem List Decreased strength;Decreased activity tolerance;Decreased balance;Decreased mobility;Decreased coordination;Decreased knowledge of use of DME  PT Treatment Interventions DME instruction;Gait training;Stair training;Functional mobility training;Therapeutic activities;Therapeutic exercise;Balance training;Patient/family education   PT Goals (Current goals can be found in the Care Plan section) Acute Rehab PT Goals Patient Stated Goal: To return home PT Goal Formulation: With patient Time For Goal Achievement: 03/07/16 Potential to Achieve Goals: Good    Frequency Min 2X/week    Barriers to discharge        Co-evaluation               End of Session Equipment Utilized During Treatment: Gait belt Activity Tolerance: Patient tolerated treatment well Patient left: in chair;with call bell/phone within reach;with chair alarm set;with family/visitor present Nurse Communication: Mobility status         Time: 7026-3785 PT Time Calculation (min) (ACUTE ONLY): 35 min   Charges:         PT G CodesThereasa Parkin 03/09/16, 4:27 PM  Thereasa Parkin, SPT [phone]

## 2016-02-22 NOTE — Discharge Summary (Signed)
[facility] [place] at [facility]   PATIENT NAME: Anita Ward    MR#:  [id number]  DATE OF BIRTH:  [birth year]  DATE OF ADMISSION:  02/19/2016 ADMITTING PHYSICIAN: Wyatt Haste, MD  DATE OF DISCHARGE: 02/22/2016  2:35 PM  PRIMARY CARE PHYSICIAN: Barbette Reichmann, MD    ADMISSION DIAGNOSIS:  Hypokalemia [E87.6] Hyponatremia [E87.1]  DISCHARGE DIAGNOSIS:  Active Problems:   Chest pain   Hyponatremia   SECONDARY DIAGNOSIS:   Past Medical History:  Diagnosis Date  . Congestive heart failure (CHF) (HCC)   . Coronary artery disease   . Glaucoma   . HTN (hypertension)   . Osteoporosis     HOSPITAL COURSE:   82 year old female with past medical history of osteoporosis, hypertension, glaucoma, history of CHF, coronary artery disease presents to the hospital due to weakness and noted to be hyponatremic.  1. Hyponatremia-This was probably need hypotonic in nature. Patient was given some gentle IV fluids and his sodium is improved to 128 on the day of discharge. She is clinically asymptomatic now. -She is being discharged home.  2. Atypical chest pain-likely musculoskeletal in nature. Cardiac markers 3 were negative. - this has resolved now  3. Accelerated HTN - wishes blood pressure was quite labile on the hospital. -She was placed on as needed hydralazine and labetalol and also maintaining on her antihypertensives. This has improved with adjustment of her blood pressure meds. At this point patient is being discharged on clonidine, hydralazine, Imdur, losartan. -Further titrations to her antihypertensive regimen can be done outpatient by a cardiologist which remitted follow up with.  4. Hypokalemia - this has been replaced and now resolved.  5. Hyperlipidemia - pt. Will cont. Simvastatin  6. Hypothyroidism - pt. Will cont. Synthroid.   7. Depression - pt. Will cont. Zoloft.   She is being discharged home with home health physical therapy and  nursing services.  DISCHARGE CONDITIONS:   Stable  CONSULTS OBTAINED:  Treatment Team:  Wyatt Haste, MD  DRUG ALLERGIES:   Allergies  Allergen Reactions  . Benicar [Olmesartan] Other (See Comments)    Reaction:  Unknown   . Cardura [Doxazosin Mesylate] Other (See Comments)    Reaction:  Unknown   . Ciprofloxacin Other (See Comments)    Reaction:  Unknown  . Fosamax [Alendronate Sodium] Other (See Comments)    Reaction:  Unknown  . Lisinopril Other (See Comments)    Reaction:  Unknown   . Norvasc [Amlodipine Besylate] Other (See Comments)    Reaction:  Unknown   . Penicillins Other (See Comments)    Reaction:  Unknown   . Sulfa Antibiotics Other (See Comments)    Reaction:  Unknown   . Torsemide Anxiety    DISCHARGE MEDICATIONS:     Medication List    STOP taking these medications   chlorthalidone 25 MG tablet Commonly known as:  HYGROTON   irbesartan 300 MG tablet Commonly known as:  AVAPRO   metoprolol succinate 50 MG 24 hr tablet Commonly known as:  TOPROL-XL     TAKE these medications   ALPRAZolam 0.5 MG tablet Commonly known as:  XANAX Take 1 tablet (0.5 mg total) by mouth 3 (three) times daily as needed for sleep or anxiety. What changed:  how much to take   aspirin 81 MG EC tablet Take 1 tablet (81 mg total) by mouth daily.   CALCIUM 600+D 600-400 MG-UNIT tablet Generic drug:  Calcium Carbonate-Vitamin D Take 1 tablet by mouth 2 (two) times  daily.   cholecalciferol 1000 units tablet Commonly known as:  VITAMIN D Take 1,000 Units by mouth daily.   cloNIDine 0.3 MG tablet Commonly known as:  CATAPRES Take 1 tablet (0.3 mg total) by mouth 2 (two) times daily. What changed:  medication strength  how much to take  when to take this   clopidogrel 75 MG tablet Commonly known as:  PLAVIX Take 1 tablet (75 mg total) by mouth daily with breakfast.   clotrimazole-betamethasone cream Commonly known as:  LOTRISONE Apply 1 application  topically 2 (two) times daily.   CRANBERRY PO Take 1 capsule by mouth daily.   furosemide 40 MG tablet Commonly known as:  LASIX Take 1 tablet (40 mg total) by mouth at bedtime.   hydrALAZINE 50 MG tablet Commonly known as:  APRESOLINE Take 1 tablet (50 mg total) by mouth every 6 (six) hours.   isosorbide mononitrate 30 MG 24 hr tablet Commonly known as:  IMDUR Take 30 mg by mouth 2 (two) times daily.   levothyroxine 25 MCG tablet Commonly known as:  SYNTHROID, LEVOTHROID Take 25 mcg by mouth daily.   losartan 100 MG tablet Commonly known as:  COZAAR Take 1 tablet (100 mg total) by mouth daily. What changed:  medication strength  how much to take   Magnesium 100 MG Tabs Take 100 mg by mouth 2 (two) times daily.   nitroGLYCERIN 0.4 MG SL tablet Commonly known as:  NITROSTAT Place 0.4 mg under the tongue every 5 (five) minutes as needed for chest pain.   pantoprazole 40 MG tablet Commonly known as:  PROTONIX Take 40 mg by mouth daily.   Potassium Chloride ER 20 MEQ Tbcr Take 20 mEq by mouth daily.   sertraline 25 MG tablet Commonly known as:  ZOLOFT Take 25 mg by mouth daily.   simvastatin 10 MG tablet Commonly known as:  ZOCOR Take 1 tablet (10 mg total) by mouth daily at 6 PM.   traMADol 50 MG tablet Commonly known as:  ULTRAM Take 50 mg by mouth 3 (three) times daily as needed for moderate pain.   vitamin B-12 1000 MCG tablet Commonly known as:  CYANOCOBALAMIN Take 1,000 mcg by mouth daily.         DISCHARGE INSTRUCTIONS:   DIET:  Cardiac diet  DISCHARGE CONDITION:  Stable  ACTIVITY:  Activity as tolerated  OXYGEN:  Home Oxygen: No.   Oxygen Delivery: room air  DISCHARGE LOCATION:  Home with home health physical therapy and nursing.   If you experience worsening of your admission symptoms, develop shortness of breath, life threatening emergency, suicidal or homicidal thoughts you must seek medical attention immediately by calling  911 or calling your MD immediately  if symptoms less severe.  You Must read complete instructions/literature along with all the possible adverse reactions/side effects for all the Medicines you take and that have been prescribed to you. Take any new Medicines after you have completely understood and accpet all the possible adverse reactions/side effects.   Please note  You were cared for by a hospitalist during your hospital stay. If you have any questions about your discharge medications or the care you received while you were in the hospital after you are discharged, you can call the unit and asked to speak with the hospitalist on call if the hospitalist that took care of you is not available. Once you are discharged, your primary care physician will handle any further medical issues. Please note that NO REFILLS for any  discharge medications will be authorized once you are discharged, as it is imperative that you return to your primary care physician (or establish a relationship with a primary care physician if you do not have one) for your aftercare needs so that they can reassess your need for medications and monitor your lab values.     Today   Blood pressure improved. No chest pain, nausea, vomiting or dizziness.  VITAL SIGNS:  Blood pressure (!) 184/59, pulse 68, temperature 98.6 F (37 C), temperature source Oral, resp. rate 16, height 5\' 2"  (1.575 m), weight 55.9 kg (123 lb 4.8 oz), SpO2 99 %.  I/O:  No intake or output data in the 24 hours ending 02/22/16 1653  PHYSICAL EXAMINATION:  GENERAL:  82 y.o.-year-old patient lying in the bed in no acute distress.  EYES: Pupils equal, round, reactive to light and accommodation. No scleral icterus. Extraocular muscles intact.  HEENT: Head atraumatic, normocephalic. Oropharynx and nasopharynx clear.  NECK:  Supple, no jugular venous distention. No thyroid enlargement, no tenderness.  LUNGS: Normal breath sounds bilaterally, no wheezing,  rales,rhonchi. No use of accessory muscles of respiration.  CARDIOVASCULAR: S1, S2 normal. II/VI SEM at LSB, No rubs, or gallops.  ABDOMEN: Soft, non-tender, non-distended. Bowel sounds present. No organomegaly or mass.  EXTREMITIES: No pedal edema, cyanosis, or clubbing.  NEUROLOGIC: Cranial nerves II through XII are intact. No focal motor or sensory defecits b/l.  PSYCHIATRIC: The patient is alert and oriented x 3. Good affect.  SKIN: No obvious rash, lesion, or ulcer.   DATA REVIEW:   CBC  Recent Labs Lab 02/19/16 1205  WBC 13.1*  HGB 12.2  HCT 34.7*  PLT 273    Chemistries   Recent Labs Lab 02/19/16 1205  02/21/16 0435 02/22/16 1016  NA 122*  < > 128* 127*  K 2.6*  < > 2.7* 3.7  CL 76*  < > 88* 92*  CO2 37*  < > 33* 23  GLUCOSE 121*  < > 98 144*  BUN 32*  < > 17 12  CREATININE 1.09*  < > 0.80 0.74  CALCIUM 9.3  < > 8.9 8.7*  MG 2.3  --  1.9  --   AST 22  --   --   --   ALT 12*  --   --   --   ALKPHOS 65  --   --   --   BILITOT 1.0  --   --   --   < > = values in this interval not displayed.  Cardiac Enzymes  Recent Labs Lab 02/20/16 1301  TROPONINI <0.03    RADIOLOGY:  No results found.    Management plans discussed with the patient, family and they are in agreement.  CODE STATUS:     Code Status Orders        Start     Ordered   02/19/16 1414  Do not attempt resuscitation (DNR)  Continuous    Question Answer Comment  In the event of cardiac or respiratory ARREST Do not call a "code blue"   In the event of cardiac or respiratory ARREST Do not perform Intubation, CPR, defibrillation or ACLS   In the event of cardiac or respiratory ARREST Use medication by any route, position, wound care, and other measures to relive pain and suffering. May use oxygen, suction and manual treatment of airway obstruction as needed for comfort.      02/19/16 1413    Code Status History  Date Active Date Inactive Code Status Order ID Comments User Context    02/19/2016  2:13 PM 02/19/2016  2:13 PM Full Code [id number]  Wyatt Haste, MD ED   05/27/2015  2:17 AM 05/29/2015  3:14 PM Full Code [id number]  Arnaldo Natal, MD Inpatient   03/15/2015 11:12 AM 03/19/2015  3:05 PM DNR [id number]  Suan Halter, MD Inpatient   03/12/2015  8:16 PM 03/15/2015 11:11 AM Full Code [id number]  Altamese Dilling, MD Inpatient    Advance Directive Documentation   Flowsheet Row Most Recent Value  Type of Advance Directive  Living will  Pre-existing out of facility DNR order (yellow form or pink MOST form)  No data  "MOST" Form in Place?  No data      TOTAL TIME TAKING CARE OF THIS PATIENT: 40 minutes.    Houston Siren M.D on 02/22/2016 at 4:53 PM  Between 7am to 6pm - Pager - [phone]  After 6pm go to [email or web address] - password [email or web address] [facility]  [place]  Office  [phone]  CC: Primary care physician; Barbette Reichmann, MD

## 2016-02-22 NOTE — Care Management (Addendum)
Admitted to this facility with the diagnosis of chest pain. Lives alone. Son lives in [place]. Anita Ward, Anita Ward comes to check on her [phone]). Last seen Dr. Marcello Fennel a week ago. Home Health in the past, can't remember name of agency. Skilled facility for a few days, can't remember name of facility.  No home oxygen. Uses a cane to aid in ambulation. No falls. Fine appetite. Takes care of all basic activities of daily living herself, never learned how to drive. Prescriptions are filled at [facility]. [facility] will transport. Physical therapy evaluation completed. Recommends home with home health/therapy. Discussed with [name]. Telephone call to Feliberto Gottron, [facility] representative. States they have followed [name] in the past. Discharge to home today per Dr. Gearldine Bienenstock RN MSN CCM Care Management [phone]

## 2016-03-19 ENCOUNTER — Emergency Department: Payer: Medicare Other

## 2016-03-19 ENCOUNTER — Inpatient Hospital Stay
Admission: EM | Admit: 2016-03-19 | Discharge: 2016-03-22 | DRG: 689 | Disposition: A | Discharge status: Home / Self Care | Payer: Medicare Other | Attending: Internal Medicine | Admitting: Internal Medicine

## 2016-03-19 DIAGNOSIS — Z951 Presence of aortocoronary bypass graft: Secondary | ICD-10-CM

## 2016-03-19 DIAGNOSIS — Z66 Do not resuscitate: Secondary | ICD-10-CM | POA: Diagnosis present

## 2016-03-19 DIAGNOSIS — G9341 Metabolic encephalopathy: Secondary | ICD-10-CM | POA: Diagnosis present

## 2016-03-19 DIAGNOSIS — Z888 Allergy status to other drugs, medicaments and biological substances status: Secondary | ICD-10-CM

## 2016-03-19 DIAGNOSIS — E871 Hypo-osmolality and hyponatremia: Secondary | ICD-10-CM | POA: Diagnosis present

## 2016-03-19 DIAGNOSIS — K219 Gastro-esophageal reflux disease without esophagitis: Secondary | ICD-10-CM | POA: Diagnosis present

## 2016-03-19 DIAGNOSIS — Z79899 Other long term (current) drug therapy: Secondary | ICD-10-CM

## 2016-03-19 DIAGNOSIS — K449 Diaphragmatic hernia without obstruction or gangrene: Secondary | ICD-10-CM | POA: Diagnosis present

## 2016-03-19 DIAGNOSIS — R911 Solitary pulmonary nodule: Secondary | ICD-10-CM | POA: Diagnosis present

## 2016-03-19 DIAGNOSIS — I2510 Atherosclerotic heart disease of native coronary artery without angina pectoris: Secondary | ICD-10-CM | POA: Diagnosis present

## 2016-03-19 DIAGNOSIS — E860 Dehydration: Secondary | ICD-10-CM | POA: Diagnosis present

## 2016-03-19 DIAGNOSIS — Z7982 Long term (current) use of aspirin: Secondary | ICD-10-CM | POA: Diagnosis not present

## 2016-03-19 DIAGNOSIS — I5032 Chronic diastolic (congestive) heart failure: Secondary | ICD-10-CM | POA: Diagnosis present

## 2016-03-19 DIAGNOSIS — H409 Unspecified glaucoma: Secondary | ICD-10-CM | POA: Diagnosis present

## 2016-03-19 DIAGNOSIS — Z880 Allergy status to penicillin: Secondary | ICD-10-CM | POA: Diagnosis not present

## 2016-03-19 DIAGNOSIS — Z882 Allergy status to sulfonamides status: Secondary | ICD-10-CM | POA: Diagnosis not present

## 2016-03-19 DIAGNOSIS — Z8249 Family history of ischemic heart disease and other diseases of the circulatory system: Secondary | ICD-10-CM | POA: Diagnosis not present

## 2016-03-19 DIAGNOSIS — N3000 Acute cystitis without hematuria: Principal | ICD-10-CM | POA: Diagnosis present

## 2016-03-19 DIAGNOSIS — N390 Urinary tract infection, site not specified: Secondary | ICD-10-CM

## 2016-03-19 DIAGNOSIS — R631 Polydipsia: Secondary | ICD-10-CM | POA: Diagnosis present

## 2016-03-19 DIAGNOSIS — M810 Age-related osteoporosis without current pathological fracture: Secondary | ICD-10-CM | POA: Diagnosis present

## 2016-03-19 DIAGNOSIS — I110 Hypertensive heart disease with heart failure: Secondary | ICD-10-CM | POA: Diagnosis present

## 2016-03-19 DIAGNOSIS — R079 Chest pain, unspecified: Secondary | ICD-10-CM

## 2016-03-19 DIAGNOSIS — R410 Disorientation, unspecified: Secondary | ICD-10-CM | POA: Diagnosis not present

## 2016-03-19 LAB — COMPREHENSIVE METABOLIC PANEL
ALT: 14 U/L (ref 14–54)
AST: 21 U/L (ref 15–41)
Albumin: 3.8 g/dL (ref 3.5–5.0)
Alkaline Phosphatase: 59 U/L (ref 38–126)
Anion gap: 8 (ref 5–15)
BUN: 21 mg/dL — ABNORMAL HIGH (ref 6–20)
CO2: 31 mmol/L (ref 22–32)
Calcium: 9.3 mg/dL (ref 8.9–10.3)
Chloride: 76 mmol/L — ABNORMAL LOW (ref 101–111)
Creatinine, Ser: 0.72 mg/dL (ref 0.44–1.00)
GFR calc Af Amer: >60 mL/min (ref >60)
GFR calc non Af Amer: >60 mL/min (ref >60)
Glucose, Bld: 109 mg/dL — ABNORMAL HIGH (ref 65–99)
Potassium: 3.5 mmol/L (ref 3.5–5.1)
Sodium: 115 mmol/L — CL (ref 135–145)
Total Bilirubin: 0.9 mg/dL (ref 0.3–1.2)
Total Protein: 7.4 g/dL (ref 6.5–8.1)

## 2016-03-19 LAB — CBC WITH DIFFERENTIAL/PLATELET
Basophils Absolute: 0.1 10*3/uL (ref 0–0.1)
Basophils Relative: 1 %
Eosinophils Absolute: 0 10*3/uL (ref 0–0.7)
Eosinophils Relative: 0 %
HCT: 36.5 % (ref 35.0–47.0)
Hemoglobin: 12.9 g/dL (ref 12.0–16.0)
Lymphocytes Relative: 11 %
Lymphs Abs: 1.8 10*3/uL (ref 1.0–3.6)
MCH: 27.2 pg (ref 26.0–34.0)
MCHC: 35.3 g/dL (ref 32.0–36.0)
MCV: 76.9 fL — ABNORMAL LOW (ref 80.0–100.0)
Monocytes Absolute: 0.9 10*3/uL (ref 0.2–0.9)
Monocytes Relative: 6 %
Neutro Abs: 14.1 10*3/uL — ABNORMAL HIGH (ref 1.4–6.5)
Neutrophils Relative %: 82 %
Platelets: 380 10*3/uL (ref 150–440)
RBC: 4.75 MIL/uL (ref 3.80–5.20)
RDW: 14.6 % — ABNORMAL HIGH (ref 11.5–14.5)
WBC: 17 10*3/uL — ABNORMAL HIGH (ref 3.6–11.0)

## 2016-03-19 LAB — URINALYSIS COMPLETE WITH MICROSCOPIC (ARMC ONLY)
Bilirubin Urine: NEGATIVE
Glucose, UA: NEGATIVE mg/dL
Ketones, ur: NEGATIVE mg/dL
Nitrite: NEGATIVE
Protein, ur: NEGATIVE mg/dL
Specific Gravity, Urine: 1.006 (ref 1.005–1.030)
Trans Epithel, UA: 1
pH: 8 (ref 5.0–8.0)

## 2016-03-19 LAB — PHOSPHORUS: Phosphorus: 3.4 mg/dL (ref 2.5–4.6)

## 2016-03-19 LAB — PROTIME-INR
INR: 0.99
PROTHROMBIN TIME: 13.1 s (ref 11.4–15.2)

## 2016-03-19 LAB — MAGNESIUM: Magnesium: 2.1 mg/dL (ref 1.7–2.4)

## 2016-03-19 LAB — APTT: aPTT: 27 seconds (ref 24–36)

## 2016-03-19 LAB — TROPONIN I
Troponin I: 0.03 ng/mL (ref <0.03)
Troponin I: 0.03 ng/mL (ref <0.03)

## 2016-03-19 LAB — BRAIN NATRIURETIC PEPTIDE: B NATRIURETIC PEPTIDE 5: 184 pg/mL — AB (ref 0.0–100.0)

## 2016-03-19 MED ORDER — ASPIRIN 81 MG PO CHEW
324.0000 mg | CHEWABLE_TABLET | Freq: Once | ORAL | Status: AC
  Administered 2016-03-19: 324 mg via ORAL

## 2016-03-19 MED ORDER — MAGNESIUM OXIDE 400 (241.3 MG) MG PO TABS
200.0000 mg | ORAL_TABLET | Freq: Two times a day (BID) | ORAL | Status: DC
  Administered 2016-03-19 – 2016-03-22 (×6): 200 mg via ORAL
  Filled 2016-03-19 (×3): qty 1
  Filled 2016-03-19: qty 2
  Filled 2016-03-19 (×3): qty 1

## 2016-03-19 MED ORDER — ONDANSETRON HCL 4 MG PO TABS: 4.0000 mg | ORAL_TABLET | Freq: Four times a day (QID) | ORAL | Status: DC | PRN

## 2016-03-19 MED ORDER — ALPRAZOLAM 0.25 MG PO TABS
0.2500 mg | ORAL_TABLET | Freq: Three times a day (TID) | ORAL | Status: DC | PRN
  Administered 2016-03-19 – 2016-03-22 (×4): 0.25 mg via ORAL
  Filled 2016-03-19 (×4): qty 1

## 2016-03-19 MED ORDER — ALBUTEROL SULFATE (2.5 MG/3ML) 0.083% IN NEBU: 2.5000 mg | INHALATION_SOLUTION | RESPIRATORY_TRACT | Status: DC | PRN

## 2016-03-19 MED ORDER — LEVOTHYROXINE SODIUM 25 MCG PO TABS
25.0000 ug | ORAL_TABLET | Freq: Every day | ORAL | Status: DC
  Administered 2016-03-20 – 2016-03-22 (×3): 25 ug via ORAL
  Filled 2016-03-19 (×3): qty 1

## 2016-03-19 MED ORDER — ONDANSETRON HCL 4 MG/2ML IJ SOLN: 4.0000 mg | Freq: Four times a day (QID) | INTRAMUSCULAR | Status: DC | PRN

## 2016-03-19 MED ORDER — NITROGLYCERIN 0.4 MG SL SUBL
0.4000 mg | SUBLINGUAL_TABLET | Freq: Once | SUBLINGUAL | Status: AC
  Administered 2016-03-19: 0.4 mg via SUBLINGUAL
  Filled 2016-03-19: qty 1

## 2016-03-19 MED ORDER — POTASSIUM CHLORIDE IN NACL 20-0.9 MEQ/L-% IV SOLN
INTRAVENOUS | Status: DC
  Administered 2016-03-19 – 2016-03-21 (×5): via INTRAVENOUS
  Filled 2016-03-19 (×7): qty 1000

## 2016-03-19 MED ORDER — ASPIRIN 81 MG PO TBEC: 81.0000 mg | DELAYED_RELEASE_TABLET | ORAL | Status: DC

## 2016-03-19 MED ORDER — ISOSORBIDE MONONITRATE ER 30 MG PO TB24
30.0000 mg | ORAL_TABLET | Freq: Two times a day (BID) | ORAL | Status: DC
  Administered 2016-03-19 – 2016-03-22 (×6): 30 mg via ORAL
  Filled 2016-03-19 (×6): qty 1

## 2016-03-19 MED ORDER — CLONIDINE HCL 0.1 MG PO TABS
0.3000 mg | ORAL_TABLET | Freq: Two times a day (BID) | ORAL | Status: DC
  Administered 2016-03-19 – 2016-03-22 (×6): 0.3 mg via ORAL
  Filled 2016-03-19 (×6): qty 3

## 2016-03-19 MED ORDER — ASPIRIN 81 MG PO CHEW
CHEWABLE_TABLET | ORAL | Status: AC
  Administered 2016-03-19: 324 mg via ORAL
  Filled 2016-03-19: qty 4

## 2016-03-19 MED ORDER — NITROGLYCERIN 0.4 MG SL SUBL
0.4000 mg | SUBLINGUAL_TABLET | SUBLINGUAL | Status: DC | PRN
  Administered 2016-03-19 (×3): 0.4 mg via SUBLINGUAL
  Filled 2016-03-19: qty 3

## 2016-03-19 MED ORDER — LOSARTAN POTASSIUM 50 MG PO TABS
100.0000 mg | ORAL_TABLET | Freq: Every day | ORAL | Status: DC
  Administered 2016-03-20 – 2016-03-22 (×3): 100 mg via ORAL
  Filled 2016-03-19 (×3): qty 2

## 2016-03-19 MED ORDER — SIMVASTATIN 20 MG PO TABS
10.0000 mg | ORAL_TABLET | Freq: Every day | ORAL | Status: DC
  Administered 2016-03-19 – 2016-03-21 (×3): 10 mg via ORAL
  Filled 2016-03-19 (×3): qty 1

## 2016-03-19 MED ORDER — PANTOPRAZOLE SODIUM 40 MG PO TBEC: 40.0000 mg | DELAYED_RELEASE_TABLET | Freq: Two times a day (BID) | ORAL | Status: DC

## 2016-03-19 MED ORDER — ACETAMINOPHEN 650 MG RE SUPP: 650.0000 mg | Freq: Four times a day (QID) | RECTAL | Status: DC | PRN

## 2016-03-19 MED ORDER — ACETAMINOPHEN 325 MG PO TABS
650.0000 mg | ORAL_TABLET | Freq: Four times a day (QID) | ORAL | Status: DC | PRN
  Administered 2016-03-20: 650 mg via ORAL
  Filled 2016-03-19: qty 2

## 2016-03-19 MED ORDER — SODIUM CHLORIDE 0.9 % IV BOLUS (SEPSIS)
1000.0000 mL | Freq: Once | INTRAVENOUS | Status: AC
  Administered 2016-03-19: 1000 mL via INTRAVENOUS

## 2016-03-19 MED ORDER — PANTOPRAZOLE SODIUM 40 MG PO TBEC: 40.0000 mg | DELAYED_RELEASE_TABLET | Freq: Every day | ORAL | Status: DC

## 2016-03-19 MED ORDER — POTASSIUM CHLORIDE 20 MEQ PO PACK
20.0000 meq | PACK | Freq: Every day | ORAL | Status: DC
  Administered 2016-03-20 – 2016-03-22 (×3): 20 meq via ORAL
  Filled 2016-03-19 (×3): qty 1

## 2016-03-19 MED ORDER — PANTOPRAZOLE SODIUM 40 MG PO TBEC
40.0000 mg | DELAYED_RELEASE_TABLET | Freq: Two times a day (BID) | ORAL | Status: DC
  Administered 2016-03-19 – 2016-03-22 (×6): 40 mg via ORAL
  Filled 2016-03-19 (×7): qty 1

## 2016-03-19 MED ORDER — SENNOSIDES-DOCUSATE SODIUM 8.6-50 MG PO TABS: 1.0000 | ORAL_TABLET | Freq: Every evening | ORAL | Status: DC | PRN

## 2016-03-19 MED ORDER — ASPIRIN 81 MG PO CHEW
324.0000 mg | CHEWABLE_TABLET | Freq: Every day | ORAL | Status: DC
  Administered 2016-03-20 – 2016-03-22 (×3): 324 mg via ORAL
  Filled 2016-03-19 (×4): qty 4

## 2016-03-19 MED ORDER — ENOXAPARIN SODIUM 40 MG/0.4ML ~~LOC~~ SOLN
40.0000 mg | SUBCUTANEOUS | Status: DC
  Administered 2016-03-19 – 2016-03-21 (×3): 40 mg via SUBCUTANEOUS
  Filled 2016-03-19 (×3): qty 0.4

## 2016-03-19 MED ORDER — CLOPIDOGREL BISULFATE 75 MG PO TABS
75.0000 mg | ORAL_TABLET | Freq: Every day | ORAL | Status: DC
  Administered 2016-03-20 – 2016-03-22 (×3): 75 mg via ORAL
  Filled 2016-03-19 (×3): qty 1

## 2016-03-19 MED ORDER — DEXTROSE 5 % IV SOLN
1.0000 g | INTRAVENOUS | Status: DC
  Administered 2016-03-20 – 2016-03-21 (×2): 1 g via INTRAVENOUS
  Filled 2016-03-19 (×2): qty 10

## 2016-03-19 MED ORDER — TRAMADOL HCL 50 MG PO TABS
50.0000 mg | ORAL_TABLET | Freq: Three times a day (TID) | ORAL | Status: DC | PRN
  Administered 2016-03-19 – 2016-03-20 (×2): 50 mg via ORAL
  Filled 2016-03-19 (×2): qty 1

## 2016-03-19 MED ORDER — LEVOFLOXACIN IN D5W 750 MG/150ML IV SOLN
750.0000 mg | Freq: Once | INTRAVENOUS | Status: AC
  Administered 2016-03-19: 750 mg via INTRAVENOUS
  Filled 2016-03-19: qty 150

## 2016-03-19 NOTE — ED Notes (Signed)
Dr Inocencio Homes notified of troponin 0.03 and NA 115

## 2016-03-19 NOTE — ED Notes (Signed)
Blood cx X 2 drawn

## 2016-03-19 NOTE — ED Triage Notes (Signed)
Chest pressure that began at 230AM. Pt called EMS this AM because pressure still in chest and BP high. Pt diaphoretic PTA. 20 G to L AC by [facility]. CBG 170. Pt alert and oriented X4, active, cooperative, pt in NAD. RR even and unlabored, color WNL.

## 2016-03-19 NOTE — ED Notes (Signed)
Admitting md at bedside

## 2016-03-19 NOTE — H&P (Signed)
[facility] [place] at [facility]   PATIENT NAME: Anita Ward    MR#:  [id number]  DATE OF BIRTH:  Jun 02, 1920  DATE OF ADMISSION:  03/19/2016  PRIMARY CARE PHYSICIAN: Barbette Reichmann, MD   REQUESTING/REFERRING PHYSICIAN: Gayla Doss, MD  CHIEF COMPLAINT:   Chief Complaint  Patient presents with  . Chest Pain    HISTORY OF PRESENT ILLNESS:  Anita Ward  is a 82 y.o. female with a known history of CAD, CHF, hypertension and osteoporosis. The patient was sent to the ED due to chest pain and the increased her confusion for the past few days. The patient is confused and she isn't not good historian. She complains of generalized weakness and the chest pain with gas feeling. According to caregiver, the patient has fever,  Chills, dysuria, urinary urgency and frequency. The patient was found to have a low sodium in the 115 and UTI. She is treated with Rocephin in the ED. Her troponin level is 0.03.  PAST MEDICAL HISTORY:   Past Medical History:  Diagnosis Date  . Congestive heart failure (CHF) (HCC)   . Coronary artery disease   . Glaucoma   . HTN (hypertension)   . Osteoporosis     PAST SURGICAL HISTORY:   Past Surgical History:  Procedure Laterality Date  . CARDIAC SURGERY    . CARDIAC SURGERY    . CORONARY ARTERY BYPASS GRAFT      SOCIAL HISTORY:   Social History  Substance Use Topics  . Smoking status: Never Smoker  . Smokeless tobacco: Never Used  . Alcohol use No    FAMILY HISTORY:   Family History  Problem Relation Age of Onset  . Congestive Heart Failure Father   . CAD Brother     DRUG ALLERGIES:   Allergies  Allergen Reactions  . Benicar [Olmesartan] Other (See Comments)    Reaction:  Unknown   . Cardura [Doxazosin Mesylate] Other (See Comments)    Reaction:  Unknown   . Ciprofloxacin Other (See Comments)    Reaction:  Unknown  . Fosamax [Alendronate Sodium] Other (See Comments)    Reaction:  Unknown  . Lisinopril Other  (See Comments)    Reaction:  Unknown   . Norvasc [Amlodipine Besylate] Other (See Comments)    Reaction:  Unknown   . Penicillins Other (See Comments)    Reaction:  Unknown   . Sulfa Antibiotics Other (See Comments)    Reaction:  Unknown   . Torsemide Anxiety    REVIEW OF SYSTEMS:   Review of Systems  Constitutional: Positive for chills and fever.  Eyes: Negative for blurred vision and double vision.  Respiratory: Negative for cough, shortness of breath and wheezing.   Cardiovascular: Positive for chest pain. Negative for palpitations and leg swelling.  Gastrointestinal: Negative for abdominal pain, blood in stool, diarrhea, melena, nausea and vomiting.  Genitourinary: Positive for dysuria, frequency and urgency. Negative for flank pain and hematuria.  Musculoskeletal: Negative for joint pain.  Skin: Negative for itching and rash.  Neurological: Positive for weakness. Negative for dizziness, focal weakness and loss of consciousness.  Psychiatric/Behavioral: Negative for depression.    MEDICATIONS AT HOME:   Prior to Admission medications   Medication Sig Start Date End Date Taking? Authorizing Provider  ALPRAZolam Prudy Feeler) 0.5 MG tablet Take 1 tablet (0.5 mg total) by mouth 3 (three) times daily as needed for sleep or anxiety. Patient taking differently: Take 0.25 mg by mouth 3 (three) times daily as needed  for sleep or anxiety.  05/17/15 05/16/16 Yes Emily FilbertJonathan E Williams, MD  aspirin EC 81 MG EC tablet Take 1 tablet (81 mg total) by mouth daily. Patient taking differently: Take 81 mg by mouth every morning.  05/29/15  Yes Altamese DillingVaibhavkumar Vachhani, MD  Calcium Carbonate-Vitamin D (CALCIUM 600+D) 600-400 MG-UNIT tablet Take 1 tablet by mouth 2 (two) times daily.   Yes Historical Provider, MD  cholecalciferol (VITAMIN D) 1000 UNITS tablet Take 1,000 Units by mouth every morning.    Yes Historical Provider, MD  cloNIDine (CATAPRES) 0.3 MG tablet Take 1 tablet (0.3 mg total) by mouth 2 (two)  times daily. 02/22/16  Yes Houston SirenVivek J Sainani, MD  clopidogrel (PLAVIX) 75 MG tablet Take 1 tablet (75 mg total) by mouth daily with breakfast. 05/29/15  Yes Altamese DillingVaibhavkumar Vachhani, MD  clotrimazole-betamethasone (LOTRISONE) cream Apply 1 application topically 2 (two) times daily as needed.    Yes Historical Provider, MD  CRANBERRY PO Take 1 capsule by mouth every morning.    Yes Historical Provider, MD  furosemide (LASIX) 40 MG tablet Take 1 tablet (40 mg total) by mouth at bedtime. 02/03/16 02/02/17 Yes Myrna Blazeravid Matthew Schaevitz, MD  hydrALAZINE (APRESOLINE) 50 MG tablet Take 1 tablet (50 mg total) by mouth every 6 (six) hours. Patient taking differently: Take 50 mg by mouth every 6 (six) hours as needed.  05/29/15  Yes Altamese DillingVaibhavkumar Vachhani, MD  isosorbide mononitrate (IMDUR) 30 MG 24 hr tablet Take 30 mg by mouth 2 (two) times daily.   Yes Historical Provider, MD  levothyroxine (SYNTHROID, LEVOTHROID) 25 MCG tablet Take 25 mcg by mouth every morning.    Yes Historical Provider, MD  losartan (COZAAR) 100 MG tablet Take 1 tablet (100 mg total) by mouth daily. Patient taking differently: Take 100 mg by mouth every morning.  02/22/16  Yes Houston SirenVivek J Sainani, MD  Magnesium 200 MG TABS Take 200 mg by mouth 2 (two) times daily.   Yes Historical Provider, MD  nitroGLYCERIN (NITROSTAT) 0.4 MG SL tablet Place 0.4 mg under the tongue every 5 (five) minutes x 3 doses as needed for chest pain.    Yes Historical Provider, MD  pantoprazole (PROTONIX) 40 MG tablet Take 40 mg by mouth every morning.    Yes Historical Provider, MD  Potassium Chloride ER 20 MEQ TBCR Take 20 mEq by mouth every morning.    Yes Historical Provider, MD  simvastatin (ZOCOR) 10 MG tablet Take 1 tablet (10 mg total) by mouth daily at 6 PM. 05/29/15  Yes Altamese DillingVaibhavkumar Vachhani, MD  traMADol (ULTRAM) 50 MG tablet Take 50 mg by mouth 3 (three) times daily as needed for moderate pain.   Yes Historical Provider, MD  vitamin B-12 (CYANOCOBALAMIN) 1000 MCG  tablet Take 1,000 mcg by mouth every morning.    Yes Historical Provider, MD      VITAL SIGNS:  Blood pressure (!) 170/77, pulse 94, temperature 98.2 F (36.8 C), temperature source Oral, resp. rate (!) 24, height 5\' 2"  (1.575 m), weight 120 lb (54.4 kg), SpO2 98 %.  PHYSICAL EXAMINATION:  Physical Exam  Constitutional: She is oriented to person, place, and time and well-developed, well-nourished, and in no distress. No distress.  HENT:  Head: Normocephalic.   dry oral mucosa.  Eyes: Conjunctivae and EOM are normal. Pupils are equal, round, and reactive to light. No scleral icterus.  Neck: Normal range of motion. Neck supple. No JVD present.  Cardiovascular: Normal rate, regular rhythm and normal heart sounds.  Exam reveals no gallop.  No murmur heard. Pulmonary/Chest: Effort normal and breath sounds normal. No respiratory distress. She has no wheezes. She has no rales.  Abdominal: Soft. Bowel sounds are normal. She exhibits no distension. There is no tenderness.  Musculoskeletal: Normal range of motion. She exhibits no edema.  Lymphadenopathy:    She has no cervical adenopathy.  Neurological: She is alert and oriented to person, place, and time. No cranial nerve deficit.  looks confused.  Skin: Skin is dry.  Psychiatric: Affect normal.   LABORATORY PANEL:   CBC  Recent Labs Lab 03/19/16 1017  WBC 17.0*  HGB 12.9  HCT 36.5  PLT 380   ------------------------------------------------------------------------------------------------------------------  Chemistries   Recent Labs Lab 03/19/16 1017  NA 115*  K 3.5  CL 76*  CO2 31  GLUCOSE 109*  BUN 21*  CREATININE 0.72  CALCIUM 9.3  AST 21  ALT 14  ALKPHOS 59  BILITOT 0.9   ------------------------------------------------------------------------------------------------------------------  Cardiac Enzymes  Recent Labs Lab 03/19/16 1017  TROPONINI 0.03*    ------------------------------------------------------------------------------------------------------------------  RADIOLOGY:  Dg Chest Portable 1 View  Result Date: 03/19/2016 CLINICAL DATA:  Chest pain. EXAM: PORTABLE CHEST 1 VIEW COMPARISON:  Radiographs of February 19, 2016. FINDINGS: The heart size and mediastinal contours are within normal limits. Both lungs are clear. Status post coronary artery bypass graft. Atherosclerosis of thoracic aorta is noted. No pneumothorax or pleural effusion is noted. The visualized skeletal structures are unremarkable. IMPRESSION: No acute cardiopulmonary abnormality seen. Electronically Signed   By: Lupita Raider, M.D.   On: 03/19/2016 10:51      IMPRESSION AND PLAN:   Hyponatremia. Hold the Lasix, start normal saline IV and follow-up BMP.  UTI with leukocytosis. Continue Rocephin and a follow-up CBC and urine culture.  Dehydration. Continue IV fluid support and follow-up BMP.  Acute med tablet encephalopathy. Due to above. Fall and aspiration precaution.  Chest pain.  Atypical. Normal troponin. Continue aspirin and Plavix, follow-up troponin level.  Generalized weakness. PT evaluation when patient is stable.  Hypertension. Continue home hypertension medication.  All the records are reviewed and case discussed with ED provider. Management plans discussed with the patient, her son and other family members and they are in agreement.  CODE STATUS: DO NOT RESUSCITATE.  TOTAL TIME TAKING CARE OF THIS PATIENT: 58 minutes.    Shaune Pollack M.D on 03/19/2016 at 1:54 PM  Between 7am to 6pm - Pager - [phone]  After 6pm go to [email or web address] - Social research officer, government  [facility] [place] [facility]  Office  [phone]  CC: Primary care physician; Barbette Reichmann, MD   Note: This dictation was prepared with Dragon dictation along with smaller phrase technology. Any transcriptional errors that result from this process are  unintentional.

## 2016-03-19 NOTE — ED Provider Notes (Signed)
[facility] Emergency Department Provider Note   ____________________________________________   First MD Initiated Contact with Patient 03/19/16 1015     (approximate)  I have reviewed the triage vital signs and the nursing notes.   HISTORY  Chief Complaint Chest Pain  Caveat-history of present illness review of systems Limited due to the patient's confusion, information is obtained partially from her, EMS as well as her nephew.  HPI Anita Ward is a 82 y.o. female history of CHF, coronary artery disease, hypertension who presents for evaluation of chest pain today as well as increasing confusion over the past several days, gradual onset, constant, severe, no modifying factors. History is obtained from the patient as well as her nephew/caregiver at bedside. No fevers or chills, no vomiting or diarrhea. Per EMS she was quite diaphoretic on their arrival and complaining of pain in the right chest.   Past Medical History:  Diagnosis Date  . Congestive heart failure (CHF) (HCC)   . Coronary artery disease   . Glaucoma   . HTN (hypertension)   . Osteoporosis     Patient Active Problem List   Diagnosis Date Noted  . Chest pain 02/19/2016  . Hyponatremia 02/19/2016  . MI (myocardial infarction) (HCC) 05/27/2015    Past Surgical History:  Procedure Laterality Date  . CARDIAC SURGERY    . CARDIAC SURGERY    . CORONARY ARTERY BYPASS GRAFT      Prior to Admission medications   Medication Sig Start Date End Date Taking? Authorizing Provider  ALPRAZolam Prudy Feeler) 0.5 MG tablet Take 1 tablet (0.5 mg total) by mouth 3 (three) times daily as needed for sleep or anxiety. Patient taking differently: Take 0.25 mg by mouth 3 (three) times daily as needed for sleep or anxiety.  05/17/15 05/16/16  Emily Filbert, MD  aspirin EC 81 MG EC tablet Take 1 tablet (81 mg total) by mouth daily. 05/29/15   Altamese Dilling, MD  Calcium Carbonate-Vitamin D  (CALCIUM 600+D) 600-400 MG-UNIT tablet Take 1 tablet by mouth 2 (two) times daily.    Historical Provider, MD  cholecalciferol (VITAMIN D) 1000 UNITS tablet Take 1,000 Units by mouth daily.    Historical Provider, MD  cloNIDine (CATAPRES) 0.3 MG tablet Take 1 tablet (0.3 mg total) by mouth 2 (two) times daily. 02/22/16   Houston Siren, MD  clopidogrel (PLAVIX) 75 MG tablet Take 1 tablet (75 mg total) by mouth daily with breakfast. 05/29/15   Altamese Dilling, MD  clotrimazole-betamethasone (LOTRISONE) cream Apply 1 application topically 2 (two) times daily.    Historical Provider, MD  CRANBERRY PO Take 1 capsule by mouth daily.    Historical Provider, MD  furosemide (LASIX) 40 MG tablet Take 1 tablet (40 mg total) by mouth at bedtime. 02/03/16 02/02/17  Myrna Blazer, MD  hydrALAZINE (APRESOLINE) 50 MG tablet Take 1 tablet (50 mg total) by mouth every 6 (six) hours. 05/29/15   Altamese Dilling, MD  isosorbide mononitrate (IMDUR) 30 MG 24 hr tablet Take 30 mg by mouth 2 (two) times daily.    Historical Provider, MD  levothyroxine (SYNTHROID, LEVOTHROID) 25 MCG tablet Take 25 mcg by mouth daily.     Historical Provider, MD  losartan (COZAAR) 100 MG tablet Take 1 tablet (100 mg total) by mouth daily. 02/22/16   Houston Siren, MD  Magnesium 100 MG TABS Take 100 mg by mouth 2 (two) times daily.    Historical Provider, MD  nitroGLYCERIN (NITROSTAT) 0.4 MG SL  tablet Place 0.4 mg under the tongue every 5 (five) minutes as needed for chest pain.    Historical Provider, MD  pantoprazole (PROTONIX) 40 MG tablet Take 40 mg by mouth daily.    Historical Provider, MD  Potassium Chloride ER 20 MEQ TBCR Take 20 mEq by mouth daily.     Historical Provider, MD  sertraline (ZOLOFT) 25 MG tablet Take 25 mg by mouth daily.    Historical Provider, MD  simvastatin (ZOCOR) 10 MG tablet Take 1 tablet (10 mg total) by mouth daily at 6 PM. 05/29/15   Altamese DillingVaibhavkumar Vachhani, MD  traMADol (ULTRAM) 50 MG  tablet Take 50 mg by mouth 3 (three) times daily as needed for moderate pain.    Historical Provider, MD  vitamin B-12 (CYANOCOBALAMIN) 1000 MCG tablet Take 1,000 mcg by mouth daily.    Historical Provider, MD    Allergies Benicar [olmesartan]; Cardura [doxazosin mesylate]; Ciprofloxacin; Fosamax [alendronate sodium]; Lisinopril; Norvasc [amlodipine besylate]; Penicillins; Sulfa antibiotics; and Torsemide  Family History  Problem Relation Age of Onset  . Congestive Heart Failure Father   . CAD Brother     Social History Social History  Substance Use Topics  . Smoking status: Never Smoker  . Smokeless tobacco: Never Used  . Alcohol use No    Review of Systems Constitutional: No fever/chills Eyes: No visual changes. ENT: No sore throat. Cardiovascular: + chest pain. Respiratory: Denies shortness of breath. Gastrointestinal: No abdominal pain.  No nausea, no vomiting.  No diarrhea.  No constipation. Genitourinary: Negative for dysuria. Musculoskeletal: Negative for back pain. Skin: Negative for rash. Neurological: Negative for headaches, focal weakness or numbness.  10-point ROS otherwise negative.   Caveat-history of present illness review of systems Limited due to the patient's confusion, information is obtained partially from her, EMS as well as her nephew.  __________________________________________   PHYSICAL EXAM:  Vitals:   03/19/16 1017 03/19/16 1100  BP: (!) 176/86 (!) 155/74  Pulse: 75 75  Resp: 19 15  Temp: 98.2 F (36.8 C)   TempSrc: Oral   SpO2: 100% 100%  Weight: 120 lb (54.4 kg)   Height: 5\' 2"  (1.575 m)     VITAL SIGNS: ED Triage Vitals [03/19/16 1017]  Enc Vitals Group     BP (!) 176/86     Pulse Rate 75     Resp 19     Temp 98.2 F (36.8 C)     Temp Source Oral     SpO2 100 %     Weight      Height      Head Circumference      Peak Flow      Pain Score      Pain Loc      Pain Edu?      Excl. in GC?     Constitutional: Alert  and oriented to self, appears confused and fatigued but follows commands. Nontoxic-appearing and in no acute distress. Eyes: Conjunctivae are normal. PERRL. EOMI. Head: Atraumatic. Nose: No congestion/rhinnorhea. Mouth/Throat: Mucous membranes are moist.  Oropharynx non-erythematous. Neck: No stridor.  Supple without meningismus. Cardiovascular: Normal rate, regular rhythm. Grossly normal heart sounds.  Good peripheral circulation. Respiratory: Normal respiratory effort.  No retractions. Lungs CTAB. Gastrointestinal: Soft and nontender. No distention. No CVA tenderness. Genitourinary: deferred Musculoskeletal: No lower extremity tenderness nor edema.  No joint effusions. Neurologic:  Normal speech and language. No gross focal neurologic deficits are appreciated.  Skin:  Skin is warm, dry and intact. No rash noted.  Psychiatric: Mood and affect are normal. Speech and behavior are normal.  ____________________________________________   LABS (all labs ordered are listed, but only abnormal results are displayed)  Labs Reviewed  CBC WITH DIFFERENTIAL/PLATELET - Abnormal; Notable for the following:       Result Value   WBC 17.0 (*)    MCV 76.9 (*)    RDW 14.6 (*)    Neutro Abs 14.1 (*)    All other components within normal limits  COMPREHENSIVE METABOLIC PANEL - Abnormal; Notable for the following:    Sodium 115 (*)    Chloride 76 (*)    Glucose, Bld 109 (*)    BUN 21 (*)    All other components within normal limits  TROPONIN I - Abnormal; Notable for the following:    Troponin I 0.03 (*)    All other components within normal limits  BRAIN NATRIURETIC PEPTIDE - Abnormal; Notable for the following:    B Natriuretic Peptide 184.0 (*)    All other components within normal limits  URINALYSIS COMPLETEWITH MICROSCOPIC ([facility] ONLY) - Abnormal; Notable for the following:    Color, Urine YELLOW (*)    APPearance TURBID (*)    Hgb urine dipstick 2+ (*)    Leukocytes, UA 2+ (*)     Bacteria, UA MANY (*)    Squamous Epithelial / LPF 6-30 (*)    All other components within normal limits  CULTURE, BLOOD (ROUTINE X 2)  CULTURE, BLOOD (ROUTINE X 2)  URINE CULTURE  PROTIME-INR  APTT   ____________________________________________  EKG  ED ECG REPORT I, Gayla Doss, the attending physician, personally viewed and interpreted this ECG.   Date: 03/19/2016  EKG Time: 10:15  Rate: 77  Rhythm: normal sinus rhythm  Axis: normal  Intervals: Nonspecific intraventricular conduction delay.  ST&T Change: No acute ST elevation or acute ST depression.  ____________________________________________  RADIOLOGY  CXR IMPRESSION:  No acute cardiopulmonary abnormality seen.      ____________________________________________   PROCEDURES  Procedure(s) performed: None  Procedures  Critical Care performed: Yes, see critical care note(s)   CRITICAL CARE Performed by: Toney Rakes A   Total critical care time: 35 minutes  Critical care time was exclusive of separately billable procedures and treating other patients.  Critical care was necessary to treat or prevent imminent or life-threatening deterioration.  Critical care was time spent personally by me on the following activities: development of treatment plan with patient and/or surrogate as well as nursing, discussions with consultants, evaluation of patient's response to treatment, examination of patient, obtaining history from patient or surrogate, ordering and performing treatments and interventions, ordering and review of laboratory studies, ordering and review of radiographic studies, pulse oximetry and re-evaluation of patient's condition.  ____________________________________________   INITIAL IMPRESSION / ASSESSMENT AND PLAN / ED COURSE  Pertinent labs & imaging results that were available during my care of the patient were reviewed by me and considered in my medical decision making (see chart for  details).  Anita Ward is a 82 y.o. female history of CHF, coronary artery disease, hypertension who presents for evaluation of chest pain today as well as increasing confusion over the past several days. On exam, she is nontoxic but fatigued appearing. Her vital signs are stable And she is afebrile. Her EKG is not consistent with acute ischemia. Pain has improved after aspirin as well as a sublingual nitroglycerin. Her workup was concerning for severe hyponatremia, sodium of 115, will start IV normal saline. Urinalysis is  also concerning for urinary tract infection. She is allergic to multiple antibiotics however family reports that she has only had a mild rash in the setting of ciprofloxacin use so we'll give Levaquin for possible UTI. Troponin is 0.03. BNP is mildly elevated, chest x-ray clear. Case discussed with hospitalist for admission at 12 PM.  Clinical Course     ____________________________________________   FINAL CLINICAL IMPRESSION(S) / ED DIAGNOSES  Final diagnoses:  Chest pain, unspecified chest pain type  Confusion  Hyponatremia  UTI (lower urinary tract infection)      NEW MEDICATIONS STARTED DURING THIS VISIT:  New Prescriptions   No medications on file     Note:  This document was prepared using Dragon voice recognition software and may include unintentional dictation errors.    Gayla Doss, MD 03/19/16 [phone]

## 2016-03-19 NOTE — Progress Notes (Signed)
Patient admitted today from home alone. Grandson comes and checks on the patient every day and does her medications. Per patient she has a nurse that comes to her house a few times a week. Admitted with some chest pressure complaints and hyponatremia. Fluids currently infusing. History of CHF, but lungs are clear at this time. Family has been at bedside. NSR on tele.  Will continue to monitor.

## 2016-03-19 NOTE — Progress Notes (Signed)
Pharmacy Antibiotic Note  Anita Ward is a 82 y.o. female admitted on 03/19/2016 with UTI.  Pharmacy has been consulted for ceftriaxone dosing.  Plan: Ceftriaxone 1 g  IV q 24 hours starting 9/11. Levaquin dose given today.   Height: 5\' 2"  (157.5 cm) Weight: 122 lb (55.3 kg) IBW/kg (Calculated) : 50.1  Temp (24hrs), Avg:98.2 F (36.8 C), Min:98.1 F (36.7 C), Max:98.2 F (36.8 C)   Recent Labs Lab 03/19/16 1017  WBC 17.0*  CREATININE 0.72    Estimated Creatinine Clearance: 34 mL/min (by C-G formula based on SCr of 0.8 mg/dL).    Allergies  Allergen Reactions  . Benicar [Olmesartan] Other (See Comments)    Reaction:  Unknown   . Cardura [Doxazosin Mesylate] Other (See Comments)    Reaction:  Unknown   . Ciprofloxacin Other (See Comments)    Reaction:  Unknown  . Fosamax [Alendronate Sodium] Other (See Comments)    Reaction:  Unknown  . Lisinopril Other (See Comments)    Reaction:  Unknown   . Norvasc [Amlodipine Besylate] Other (See Comments)    Reaction:  Unknown   . Penicillins Other (See Comments)    Reaction:  Unknown   . Sulfa Antibiotics Other (See Comments)    Reaction:  Unknown   . Torsemide Anxiety    Antimicrobials this admission: Levaquin 9/10 >> x 1 Ceftriaxone 9/11 >>   Dose adjustments this admission:   Microbiology results: 9/10 BCx: pending 9/10 UCx: pending   Thank you for allowing pharmacy to be a part of this patient's care.  Valentina Gu 03/19/2016 2:39 PM

## 2016-03-19 NOTE — Progress Notes (Signed)
Patient is still having chest pressure. Given nitro x3 with no relief and also given tramadol. After the tramadol patient went to sleep for a while. Now she has eaten dinner and states the discomfort is back and she feels it may be her acid reflux. Dr. Imogene Burn paged and MD stated he will place new orders for maalox.

## 2016-03-20 ENCOUNTER — Inpatient Hospital Stay: Payer: Medicare Other

## 2016-03-20 LAB — CBC
HCT: 34.6 % — ABNORMAL LOW (ref 35.0–47.0)
Hemoglobin: 12.4 g/dL (ref 12.0–16.0)
MCH: 27.7 pg (ref 26.0–34.0)
MCHC: 35.9 g/dL (ref 32.0–36.0)
MCV: 77.1 fL — AB (ref 80.0–100.0)
PLATELETS: 358 10*3/uL (ref 150–440)
RBC: 4.48 MIL/uL (ref 3.80–5.20)
RDW: 14.5 % (ref 11.5–14.5)
WBC: 13.4 10*3/uL — ABNORMAL HIGH (ref 3.6–11.0)

## 2016-03-20 LAB — BASIC METABOLIC PANEL
Anion gap: 7 (ref 5–15)
BUN: 17 mg/dL (ref 6–20)
CHLORIDE: 91 mmol/L — AB (ref 101–111)
CO2: 26 mmol/L (ref 22–32)
CREATININE: 0.86 mg/dL (ref 0.44–1.00)
Calcium: 8.6 mg/dL — ABNORMAL LOW (ref 8.9–10.3)
GFR calc Af Amer: >60 mL/min (ref >60)
GFR calc non Af Amer: 56 mL/min — ABNORMAL LOW (ref >60)
GLUCOSE: 118 mg/dL — AB (ref 65–99)
Potassium: 3.1 mmol/L — ABNORMAL LOW (ref 3.5–5.1)
Sodium: 124 mmol/L — ABNORMAL LOW (ref 135–145)

## 2016-03-20 LAB — URINE CULTURE

## 2016-03-20 LAB — SODIUM: Sodium: 124 mmol/L — ABNORMAL LOW (ref 135–145)

## 2016-03-20 LAB — TROPONIN I: Troponin I: 0.03 ng/mL (ref <0.03)

## 2016-03-20 MED ORDER — HYDRALAZINE HCL 20 MG/ML IJ SOLN
10.0000 mg | Freq: Once | INTRAMUSCULAR | Status: AC
  Administered 2016-03-20: 10 mg via INTRAVENOUS
  Filled 2016-03-20: qty 1

## 2016-03-20 MED ORDER — ALUM & MAG HYDROXIDE-SIMETH 200-200-20 MG/5ML PO SUSP
30.0000 mL | Freq: Four times a day (QID) | ORAL | Status: DC
  Administered 2016-03-20 – 2016-03-22 (×8): 30 mL via ORAL
  Filled 2016-03-20 (×9): qty 30

## 2016-03-20 MED ORDER — CALCIUM CARBONATE ANTACID 500 MG PO CHEW
1.0000 | CHEWABLE_TABLET | ORAL | Status: DC | PRN
  Administered 2016-03-20 – 2016-03-21 (×4): 200 mg via ORAL
  Filled 2016-03-20 (×4): qty 1

## 2016-03-20 MED ORDER — HYDRALAZINE HCL 25 MG PO TABS
25.0000 mg | ORAL_TABLET | Freq: Three times a day (TID) | ORAL | Status: DC
  Administered 2016-03-21: 25 mg via ORAL
  Filled 2016-03-20 (×3): qty 1

## 2016-03-20 NOTE — Progress Notes (Signed)
[employer] - [place] at [facility]   PATIENT NAME: Anita Ward    MR#:  [id number]  DATE OF BIRTH:  June 30, 1920  SUBJECTIVE:  CHIEF COMPLAINT:   Chief Complaint  Patient presents with  . Chest Pain   - patient complains of epigastric pain and constant belching. She keeps on repeating the same sentence again.  REVIEW OF SYSTEMS:  Review of Systems  Constitutional: Negative for chills and fever.  HENT: Negative for ear discharge, ear pain and nosebleeds.   Eyes: Negative for blurred vision.  Respiratory: Negative for cough, shortness of breath and wheezing.   Cardiovascular: Negative for chest pain, palpitations and leg swelling.  Gastrointestinal: Positive for abdominal pain and heartburn. Negative for constipation, diarrhea, nausea and vomiting.  Genitourinary: Negative for dysuria and urgency.  Musculoskeletal: Positive for myalgias.  Neurological: Positive for weakness. Negative for dizziness, sensory change, speech change, focal weakness, seizures and headaches.  Psychiatric/Behavioral: Positive for memory loss.    DRUG ALLERGIES:   Allergies  Allergen Reactions  . Benicar [Olmesartan] Other (See Comments)    Reaction:  Unknown   . Cardura [Doxazosin Mesylate] Other (See Comments)    Reaction:  Unknown   . Ciprofloxacin Other (See Comments)    Reaction:  Unknown  . Fosamax [Alendronate Sodium] Other (See Comments)    Reaction:  Unknown  . Lisinopril Other (See Comments)    Reaction:  Unknown   . Norvasc [Amlodipine Besylate] Other (See Comments)    Reaction:  Unknown   . Penicillins Other (See Comments)    Reaction:  Unknown   . Sulfa Antibiotics Other (See Comments)    Reaction:  Unknown   . Torsemide Anxiety    VITALS:  Blood pressure (!) 162/70, pulse 79, temperature 98 F (36.7 C), temperature source Oral, resp. rate 16, height 5\' 2"  (1.575 m), weight 55.3 kg (122 lb), SpO2 95 %.  PHYSICAL EXAMINATION:  Physical Exam  GENERAL:  82  y.o.-year-old patient lying in the bed with no acute distress. Repeating the same question again and again. EYES: Pupils equal, round, reactive to light and accommodation. No scleral icterus. Extraocular muscles intact.  HEENT: Head atraumatic, normocephalic. Oropharynx and nasopharynx clear.  NECK:  Supple, no jugular venous distention. No thyroid enlargement, no tenderness.  LUNGS: Normal breath sounds bilaterally, no wheezing, rales,rhonchi or crepitation. No use of accessory muscles of respiration.  CARDIOVASCULAR: S1, S2 normal. No  rubs, or gallops. 3/6 systolic murmur is present ABDOMEN: Soft, nontender, nondistended. Bowel sounds present. No organomegaly or mass.  EXTREMITIES: No pedal edema, cyanosis, or clubbing.  NEUROLOGIC: Cranial nerves II through XII are intact. Muscle strength 5/5 in all extremities. Sensation intact. Gait not checked. Global Weakness. PSYCHIATRIC: The patient is alert and oriented x 2.  SKIN: No obvious rash, lesion, or ulcer.    LABORATORY PANEL:   CBC  Recent Labs Lab 03/20/16 0418  WBC 13.4*  HGB 12.4  HCT 34.6*  PLT 358   ------------------------------------------------------------------------------------------------------------------  Chemistries   Recent Labs Lab 03/19/16 1017 03/19/16 1532 03/20/16 0418 03/20/16 1053  NA 115*  --  124* 124*  K 3.5  --  3.1*  --   CL 76*  --  91*  --   CO2 31  --  26  --   GLUCOSE 109*  --  118*  --   BUN 21*  --  17  --   CREATININE 0.72  --  0.86  --   CALCIUM 9.3  --  8.6*  --   MG  --  2.1  --   --   AST 21  --   --   --   ALT 14  --   --   --   ALKPHOS 59  --   --   --   BILITOT 0.9  --   --   --    ------------------------------------------------------------------------------------------------------------------  Cardiac Enzymes  Recent Labs Lab 03/20/16 0812  TROPONINI 0.03*    ------------------------------------------------------------------------------------------------------------------  RADIOLOGY:  Ct Head Wo Contrast  Result Date: 03/20/2016 CLINICAL DATA:  Confusion.  Generalized weakness. EXAM: CT HEAD WITHOUT CONTRAST TECHNIQUE: Contiguous axial images were obtained from the base of the skull through the vertex without intravenous contrast. COMPARISON:  CT scan of January 27, 2014. FINDINGS: Bony calvarium appears intact. Paranasal sinuses are unremarkable. Minimal chronic ischemic white matter disease is noted. Mild diffuse cortical atrophy is noted. No mass effect or midline shift is noted. Ventricular size is within normal limits. There is no evidence of mass lesion, hemorrhage or acute infarction. IMPRESSION: Minimal chronic ischemic white matter disease. Mild diffuse cortical atrophy. No acute intracranial abnormality seen. Electronically Signed   By: Lupita Raider, M.D.   On: 03/20/2016 09:27   Ct Chest Wo Contrast  Result Date: 03/20/2016 CLINICAL DATA:  Inpatient.  Chest pain.  Fever. EXAM: CT CHEST WITHOUT CONTRAST TECHNIQUE: Multidetector CT imaging of the chest was performed following the standard protocol without IV contrast. COMPARISON:  03/19/2016 chest radiograph and 09/13/2011 chest CT. FINDINGS: Cardiovascular: Mild cardiomegaly. No significant pericardial fluid/thickening. Left main, left anterior descending, left circumflex and right coronary atherosclerosis status post CABG. Atherosclerotic nonaneurysmal thoracic aorta. Normal caliber pulmonary arteries. Mediastinum/Nodes: No discrete thyroid nodules. Unremarkable esophagus. No pathologically enlarged axillary, mediastinal or gross hilar lymph nodes, noting limited sensitivity for the detection of hilar adenopathy on this noncontrast study. Coarsely calcified subcarinal and right paratracheal nodes from prior granulomatous disease, unchanged. Lungs/Pleura: No pneumothorax. No pleural effusion.  Ground-glass 0.9 x 0.8 cm right upper lobe pulmonary nodule (series 3/image 40), previously 0.8 x 0.7 cm on 09/13/2011, minimally increased. Scattered tiny calcified granulomas in the lower lobes. No acute consolidative airspace disease or lung masses. Patchy subpleural reticulation throughout both lungs is not appreciably changed, with no significant regions of traction bronchiectasis. No frank honeycombing. Upper abdomen: Tiny hiatal hernia. Musculoskeletal: No aggressive appearing focal osseous lesions. Marked thoracic spondylosis. Sternotomy wires appear aligned and intact. IMPRESSION: 1. No acute consolidative airspace disease to suggest a pneumonia. 2. Right upper lobe ground-glass 0.9 cm pulmonary nodule, minimally increased from 2013. Repeat CT is recommended every 2 years until 5 years of stability has been established. This recommendation follows the consensus statement: Guidelines for Management of Incidental Pulmonary Nodules Detected on CT Images:From the Fleischner Society 2017; published online before print (10.1148/radiol.1610960454). 3. Stable mild patchy subpleural reticulation throughout both lungs, suggesting a chronic mild interstitial lung disease such as nonspecific interstitial pneumonia (NSIP). 4. Stable mild cardiomegaly. 5. Aortic atherosclerosis. Coronary atherosclerosis status post CABG . 6. Tiny hiatal hernia. Electronically Signed   By: Delbert Phenix M.D.   On: 03/20/2016 09:33   Dg Chest Portable 1 View  Result Date: 03/19/2016 CLINICAL DATA:  Chest pain. EXAM: PORTABLE CHEST 1 VIEW COMPARISON:  Radiographs of February 19, 2016. FINDINGS: The heart size and mediastinal contours are within normal limits. Both lungs are clear. Status post coronary artery bypass graft. Atherosclerosis of thoracic aorta is noted. No pneumothorax or pleural effusion is noted. The visualized  skeletal structures are unremarkable. IMPRESSION: No acute cardiopulmonary abnormality seen. Electronically Signed    By: Lupita RaiderJames  Green Jr, M.D.   On: 03/19/2016 10:51    EKG:   Orders placed or performed during the hospital encounter of 03/19/16  . ED EKG  . ED EKG  . EKG 12-Lead  . EKG 12-Lead  . EKG 12-Lead  . EKG 12-Lead  . EKG 12-Lead  . EKG 12-Lead    ASSESSMENT AND PLAN:   82 y/o F with PMH of CAD, CHF, hypertension, osteoporosis admitted with weakness, confusion nad noted to have sodium of 115 and UTI  #1 Metabolic encephalopathy-secondary to UTI and low sodium. -Sodium improving and at 124. -On antibiotics for her infection. -CT of the head with atrophy and no acute abnormalities  #2 hyponatremia-likely from polydipsia-free water restriction. -Continue normal saline for today. CT of the chest ordered to follow up her lung nodule which did not change -Continue to monitor sodium.  #3 acute cystitis-cultures are pending. -Continue Rocephin  #4 hypertension-already on clonidine twice a day, losartan, Imdur. Added hydralazine.  #5 GERD-on Protonix twice a day. Also added Maalox.  #6 DVT prophylaxis-patient on Lovenox.  Physical therapy consulted. Discussed with friend at bedside. Son to be present tomorrow   All the records are reviewed and case discussed with Care Management/Social Workerr. Management plans discussed with the patient, family and they are in agreement.  CODE STATUS: DO NOT RESUSCITATE  TOTAL TIME TAKING CARE OF THIS PATIENT: 36 minutes.   POSSIBLE D/C IN 1-2 DAYS, DEPENDING ON CLINICAL CONDITION.   Enid BaasKALISETTI,Layann Bluett M.D on 03/20/2016 at 5:40 PM  Between 7am to 6pm - Pager - [phone]  After 6pm go to [email or web address] - Social research officer, [email or web address] [email or web address]  [facility]  Office  [phone]  CC: Primary care physician; Barbette ReichmannHANDE,VISHWANATH, MD

## 2016-03-20 NOTE — Evaluation (Signed)
Physical Therapy Evaluation Patient Details Name: Anita Ward MRN: [id number] DOB: October 17, 1919 Today's Date: June 30, 202017   History of Present Illness  presented to ER secondary to chest pain, AMS; admitted with UTI, hyponatremia (upon admission 115; currently 125).  Clinical Impression  Upon evaluation, patient alert and oriented to basic information; follows simple commands and demonstrates fair insight/safety awareness.  Slightly impulsive at times; eager for OOB activities.  Bilat UE/LE strength and ROM grossly symmetrical and WFL for basic transfers and mobility; no focal weakness or pain appreciated.  Able to complete bed mobility with mod indep; sit/stand, basic transfers and gait (210') with RW, cga.  Slow, but fairly steady, without buckling or LOB (10' walk time, 14-15 seconds).  Limited functional reach outside immediate BOS (suggesting increased fall risk), but patient with fair/good awareness of deficit and use of compensatory strategies.  Do recommend continued use of RW for all mobility at this time; patient voices understanding (has access to RW at home already). Would benefit from skilled PT to address above deficits and promote optimal return to PLOF; Recommend transition to HHPT upon discharge from acute hospitalization.     Follow Up Recommendations Home health PT    Equipment Recommendations  Rolling walker with 5" wheels    Recommendations for Other Services       Precautions / Restrictions Precautions Precautions: Fall Restrictions Weight Bearing Restrictions: No      Mobility  Bed Mobility Overal bed mobility: Modified Independent                Transfers Overall transfer level: Needs assistance   Transfers: Sit to/from Stand Sit to Stand: Min guard;Min assist         General transfer comment: maintains UE support throughout sit/stand and SPT transfers  Ambulation/Gait Ambulation/Gait assistance: Min guard Ambulation Distance (Feet): 210  Feet Assistive device: Rolling walker (2 wheeled)   Gait velocity: 10' walk time, 14-15 seconds.   General Gait Details: reciprocal stepping pattern with fair step height/length; slow, but fairly steady, cadence and overall gait speed.  No buckling or LOB; no reports of chest pain.  Stairs            Wheelchair Mobility    Modified Rankin (Stroke Patients Only)       Balance Overall balance assessment: Needs assistance Sitting-balance support: No upper extremity supported;Feet supported Sitting balance-Leahy Scale: Good     Standing balance support: Bilateral upper extremity supported Standing balance-Leahy Scale: Fair                               Pertinent Vitals/Pain Pain Assessment: No/denies pain    Home Living Family/patient expects to be discharged to:: Private residence Living Arrangements: Alone Available Help at Discharge: Family;Friend(s);Available PRN/intermittently Type of Home: House Home Access: Stairs to enter Entrance Stairs-Rails:  (has handle at doorway) Entrance Stairs-Number of Steps: 1 Home Layout: One level Home Equipment: Walker - 2 wheels;Cane - single point;Grab bars - tub/shower Additional Comments: Pt's grandson and wife come to check on pt consistently. Pt's adult son lives in a group home but comes home on the weekends.     Prior Function Level of Independence: Independent with assistive device(s)         Comments: Pt reports she is independent with household ambulation. Uses SPC sometimes in the house and always in the community. Pt is independent with ADLs, is able to go out into community with grandson to  get groceries and such.   Denies fall history.     Hand Dominance        Extremity/Trunk Assessment   Upper Extremity Assessment: Overall WFL for tasks assessed           Lower Extremity Assessment: Overall WFL for tasks assessed (grossly 4/5 throughout)      Cervical / Trunk Assessment: Kyphotic   Communication   Communication: No difficulties  Cognition Arousal/Alertness: Awake/alert Behavior During Therapy: WFL for tasks assessed/performed Overall Cognitive Status: Within Functional Limits for tasks assessed       Memory: Decreased short-term memory              General Comments      Exercises Other Exercises Other Exercises: Toilet transfer, SPT without assist device, cga/min assist.  Patient constantly reaching/grabbing for armrests for external stabilization; poor functional reach (<2-3" outside immediate BOS) evident with functional activities.  [facility] device, cga/min assist.      Assessment/Plan    PT Assessment Patient needs continued PT services  PT Diagnosis Difficulty walking;Generalized weakness   PT Problem List Decreased balance;Decreased mobility;Decreased knowledge of use of DME;Decreased safety awareness;Decreased knowledge of precautions;Cardiopulmonary status limiting activity  PT Treatment Interventions DME instruction;Gait training;Stair training;Functional mobility training;Therapeutic activities;Therapeutic exercise;Balance training;Patient/family education   PT Goals (Current goals can be found in the Care Plan section) Acute Rehab PT Goals Patient Stated Goal: to move around a little bit PT Goal Formulation: With patient/family Time For Goal Achievement: 04/03/16 Potential to Achieve Goals: Good    Frequency Min 2X/week   Barriers to discharge Decreased caregiver support      Co-evaluation               End of Session Equipment Utilized During Treatment: Gait belt Activity Tolerance: Patient tolerated treatment well Patient left: in chair;with call bell/phone within reach;with chair alarm set Nurse Communication: Mobility status         Time: [id number] PT Time Calculation (min) (ACUTE ONLY): 22 min   Charges:   PT Evaluation $PT Eval Low Complexity: 1 Procedure PT Treatments $Therapeutic  Activity: 8-22 mins   PT G Codes:        Jerrod Damiano H. Manson PasseyBrown, PT, DPT, NCS 03/20/16, 11:33 AM [phone]

## 2016-03-20 NOTE — Progress Notes (Signed)
Patient  noted with increase in confusion this morning,  MD at bedside, new order for CT chest and head. Care provided throughout the shift, medicated for c/o indigestion, will continue to monitor patient. Patient on fluid restriction 1635ml/day.

## 2016-03-20 NOTE — Care Management (Signed)
Patient presents from home with chest pain.  Troponins are negative so far  She is currently open to [facility] nursing and physical therapy.  Has a grandson that checks on her and her son lives in [place].   Will request physical therapy consult

## 2016-03-20 NOTE — Progress Notes (Signed)
Patient c/o indigestion DR Hugelmer made aware, new order to give scheduled med (maloox) now. Given as ordered.

## 2016-03-20 NOTE — Progress Notes (Signed)
Pt complaining of chest tightness. Initially pt thought it was indigestion but pain is unrelieved by antacids. MD Sheryle Hail notified. Verbal order given for ekg. RN to place order. Will continue to monitor.   Mayra Neer M

## 2016-03-20 NOTE — Progress Notes (Signed)
Patient's daughter called for update, password provided, updated on plan of care and patient condition. Family member states they are arriving to visit with patient soon.

## 2016-03-20 NOTE — Progress Notes (Signed)
[facility]  Patient Status: Active  [facility] is providing the following services: SN/PT  If patient discharges after hours, please call [phone].   Anita Ward 04/09/2016, 8:33 AM

## 2016-03-21 LAB — BASIC METABOLIC PANEL
Anion gap: 6 (ref 5–15)
BUN: 16 mg/dL (ref 6–20)
CHLORIDE: 99 mmol/L — AB (ref 101–111)
CO2: 25 mmol/L (ref 22–32)
Calcium: 8.6 mg/dL — ABNORMAL LOW (ref 8.9–10.3)
Creatinine, Ser: 0.78 mg/dL (ref 0.44–1.00)
GFR calc Af Amer: >60 mL/min (ref >60)
GFR calc non Af Amer: >60 mL/min (ref >60)
Glucose, Bld: 95 mg/dL (ref 65–99)
POTASSIUM: 3.8 mmol/L (ref 3.5–5.1)
SODIUM: 130 mmol/L — AB (ref 135–145)

## 2016-03-21 LAB — CBC
HEMATOCRIT: 33.2 % — AB (ref 35.0–47.0)
Hemoglobin: 11.8 g/dL — ABNORMAL LOW (ref 12.0–16.0)
MCH: 27.7 pg (ref 26.0–34.0)
MCHC: 35.6 g/dL (ref 32.0–36.0)
MCV: 77.8 fL — AB (ref 80.0–100.0)
Platelets: 372 10*3/uL (ref 150–440)
RBC: 4.26 MIL/uL (ref 3.80–5.20)
RDW: 15.1 % — AB (ref 11.5–14.5)
WBC: 12.7 10*3/uL — AB (ref 3.6–11.0)

## 2016-03-21 MED ORDER — HYDRALAZINE HCL 50 MG PO TABS
50.0000 mg | ORAL_TABLET | Freq: Three times a day (TID) | ORAL | Status: DC
  Administered 2016-03-21 – 2016-03-22 (×2): 50 mg via ORAL
  Filled 2016-03-21 (×2): qty 1

## 2016-03-21 MED ORDER — METOPROLOL TARTRATE 25 MG PO TABS
25.0000 mg | ORAL_TABLET | Freq: Two times a day (BID) | ORAL | Status: DC
  Administered 2016-03-21 – 2016-03-22 (×2): 25 mg via ORAL
  Filled 2016-03-21 (×2): qty 1

## 2016-03-21 MED ORDER — METOPROLOL TARTRATE 25 MG PO TABS
25.0000 mg | ORAL_TABLET | Freq: Three times a day (TID) | ORAL | Status: DC
  Administered 2016-03-21: 25 mg via ORAL
  Filled 2016-03-21: qty 1

## 2016-03-21 MED ORDER — FAMOTIDINE 20 MG PO TABS
20.0000 mg | ORAL_TABLET | Freq: Two times a day (BID) | ORAL | Status: DC
  Administered 2016-03-21 – 2016-03-22 (×3): 20 mg via ORAL
  Filled 2016-03-21 (×3): qty 1

## 2016-03-21 MED ORDER — CEPHALEXIN 250 MG PO CAPS
250.0000 mg | ORAL_CAPSULE | Freq: Three times a day (TID) | ORAL | Status: DC
  Administered 2016-03-21 – 2016-03-22 (×3): 250 mg via ORAL
  Filled 2016-03-21 (×4): qty 1

## 2016-03-21 NOTE — Progress Notes (Signed)
[facility] [place] [facility]   PATIENT NAME: Anita Ward    MR#:  [id number]  DATE OF BIRTH:  07/29/1919  SUBJECTIVE:  CHIEF COMPLAINT:   Chief Complaint  Patient presents with  . Chest Pain   - Patient appears much better than yesterday. Still having indigestion complaints, they are chronic. They're better than yesterday. Has hiatal hernia too. -Sodium is improving. Very adamant about not going to rehabilitation.  REVIEW OF SYSTEMS:  Review of Systems  Constitutional: Negative for chills and fever.  HENT: Negative for ear discharge, ear pain and nosebleeds.   Eyes: Negative for blurred vision.  Respiratory: Negative for cough, shortness of breath and wheezing.   Cardiovascular: Negative for chest pain, palpitations and leg swelling.  Gastrointestinal: Positive for heartburn. Negative for abdominal pain, constipation, diarrhea, nausea and vomiting.  Genitourinary: Negative for dysuria and urgency.  Musculoskeletal: Positive for myalgias.  Neurological: Positive for weakness. Negative for dizziness, sensory change, speech change, focal weakness, seizures and headaches.  Psychiatric/Behavioral: Positive for memory loss.    DRUG ALLERGIES:   Allergies  Allergen Reactions  . Benicar [Olmesartan] Other (See Comments)    Reaction:  Unknown   . Cardura [Doxazosin Mesylate] Other (See Comments)    Reaction:  Unknown   . Ciprofloxacin Other (See Comments)    Reaction:  Unknown  . Fosamax [Alendronate Sodium] Other (See Comments)    Reaction:  Unknown  . Lisinopril Other (See Comments)    Reaction:  Unknown   . Norvasc [Amlodipine Besylate] Other (See Comments)    Reaction:  Unknown   . Penicillins Other (See Comments)    Reaction:  Unknown   . Sulfa Antibiotics Other (See Comments)    Reaction:  Unknown   . Torsemide Anxiety    VITALS:  Blood pressure (!) 170/65, pulse 94, temperature 98.3 F (36.8 C), temperature source Oral, resp. rate 17,  height 5\' 2"  (1.575 m), weight 55.3 kg (122 lb), SpO2 99 %.  PHYSICAL EXAMINATION:  Physical Exam  GENERAL:  82 y.o.-year-old patient lying in the bed with no acute distress.  EYES: Pupils equal, round, reactive to light and accommodation. No scleral icterus. Extraocular muscles intact.  HEENT: Head atraumatic, normocephalic. Oropharynx and nasopharynx clear.  NECK:  Supple, no jugular venous distention. No thyroid enlargement, no tenderness.  LUNGS: Normal breath sounds bilaterally, no wheezing, rales,rhonchi or crepitation. No use of accessory muscles of respiration.  CARDIOVASCULAR: S1, S2 normal. No  rubs, or gallops. 3/6 systolic murmur is present ABDOMEN: Soft, nontender, nondistended. Bowel sounds present. No organomegaly or mass.  EXTREMITIES: No pedal edema, cyanosis, or clubbing.  NEUROLOGIC: Cranial nerves II through XII are intact. Muscle strength 5/5 in all extremities. Sensation intact. Gait not checked. Global Weakness. PSYCHIATRIC: The patient is alert and oriented x 3.  SKIN: No obvious rash, lesion, or ulcer.    LABORATORY PANEL:   CBC  Recent Labs Lab 03/21/16 0335  WBC 12.7*  HGB 11.8*  HCT 33.2*  PLT 372   ------------------------------------------------------------------------------------------------------------------  Chemistries   Recent Labs Lab 03/19/16 1017 03/19/16 1532  03/21/16 0335  NA 115*  --   < > 130*  K 3.5  --   < > 3.8  CL 76*  --   < > 99*  CO2 31  --   < > 25  GLUCOSE 109*  --   < > 95  BUN 21*  --   < > 16  CREATININE 0.72  --   < >  0.78  CALCIUM 9.3  --   < > 8.6*  MG  --  2.1  --   --   AST 21  --   --   --   ALT 14  --   --   --   ALKPHOS 59  --   --   --   BILITOT 0.9  --   --   --   < > = values in this interval not displayed. ------------------------------------------------------------------------------------------------------------------  Cardiac Enzymes  Recent Labs Lab 03/20/16 0812  TROPONINI 0.03*    ------------------------------------------------------------------------------------------------------------------  RADIOLOGY:  Ct Head Wo Contrast  Result Date: 17-Apr-202017 CLINICAL DATA:  Confusion.  Generalized weakness. EXAM: CT HEAD WITHOUT CONTRAST TECHNIQUE: Contiguous axial images were obtained from the base of the skull through the vertex without intravenous contrast. COMPARISON:  CT scan of January 27, 2014. FINDINGS: Bony calvarium appears intact. Paranasal sinuses are unremarkable. Minimal chronic ischemic white matter disease is noted. Mild diffuse cortical atrophy is noted. No mass effect or midline shift is noted. Ventricular size is within normal limits. There is no evidence of mass lesion, hemorrhage or acute infarction. IMPRESSION: Minimal chronic ischemic white matter disease. Mild diffuse cortical atrophy. No acute intracranial abnormality seen. Electronically Signed   By: Lupita Raider, M.D.   On: 017-Apr-202017 09:27   Ct Chest Wo Contrast  Result Date: 17-Apr-202017 CLINICAL DATA:  Inpatient.  Chest pain.  Fever. EXAM: CT CHEST WITHOUT CONTRAST TECHNIQUE: Multidetector CT imaging of the chest was performed following the standard protocol without IV contrast. COMPARISON:  03/19/2016 chest radiograph and 09/13/2011 chest CT. FINDINGS: Cardiovascular: Mild cardiomegaly. No significant pericardial fluid/thickening. Left main, left anterior descending, left circumflex and right coronary atherosclerosis status post CABG. Atherosclerotic nonaneurysmal thoracic aorta. Normal caliber pulmonary arteries. Mediastinum/Nodes: No discrete thyroid nodules. Unremarkable esophagus. No pathologically enlarged axillary, mediastinal or gross hilar lymph nodes, noting limited sensitivity for the detection of hilar adenopathy on this noncontrast study. Coarsely calcified subcarinal and right paratracheal nodes from prior granulomatous disease, unchanged. Lungs/Pleura: No pneumothorax. No pleural effusion.  Ground-glass 0.9 x 0.8 cm right upper lobe pulmonary nodule (series 3/image 40), previously 0.8 x 0.7 cm on 09/13/2011, minimally increased. Scattered tiny calcified granulomas in the lower lobes. No acute consolidative airspace disease or lung masses. Patchy subpleural reticulation throughout both lungs is not appreciably changed, with no significant regions of traction bronchiectasis. No frank honeycombing. Upper abdomen: Tiny hiatal hernia. Musculoskeletal: No aggressive appearing focal osseous lesions. Marked thoracic spondylosis. Sternotomy wires appear aligned and intact. IMPRESSION: 1. No acute consolidative airspace disease to suggest a pneumonia. 2. Right upper lobe ground-glass 0.9 cm pulmonary nodule, minimally increased from 2013. Repeat CT is recommended every 2 years until 5 years of stability has been established. This recommendation follows the consensus statement: Guidelines for Management of Incidental Pulmonary Nodules Detected on CT Images:From the Fleischner Society 2017; published online before print (10.1148/radiol.[id number]). 3. Stable mild patchy subpleural reticulation throughout both lungs, suggesting a chronic mild interstitial lung disease such as nonspecific interstitial pneumonia (NSIP). 4. Stable mild cardiomegaly. 5. Aortic atherosclerosis. Coronary atherosclerosis status post CABG . 6. Tiny hiatal hernia. Electronically Signed   By: Delbert Phenix M.D.   On: 017-Apr-202017 09:33    EKG:   Orders placed or performed during the hospital encounter of 03/19/16  . ED EKG  . ED EKG  . EKG 12-Lead  . EKG 12-Lead  . EKG 12-Lead  . EKG 12-Lead  . EKG 12-Lead  . EKG 12-Lead  ASSESSMENT AND PLAN:   82 y/o F with PMH of CAD, CHF, hypertension, osteoporosis admitted with weakness, confusion nad noted to have sodium of 115 and UTI  #1 Metabolic encephalopathy-secondary to UTI and low sodium. -Sodium improving and at 130. Mental status is back to her baseline -On antibiotics for  her infection. -CT of the head with atrophy and no acute abnormalities  #2 hyponatremia-likely from polydipsia-free water restriction. -Continue normal saline. CT of the chest ordered to follow up her lung nodule which did not change -Continue to monitor sodium. Improving  #3 acute cystitis-cultures are growing multiple species. -on Rocephin- change to keflex today  #4 hypertension-already on clonidine twice a day, losartan, Imdur. Added hydralazine and oral metoprolol.  #5 GERD-on Protonix twice a day. Also added Maalox and pepcid.  #6 DVT prophylaxis-patient on Lovenox.  Physical therapy consulted. Discussed with grandson Possible discharge tomorrow   All the records are reviewed and case discussed with Care Management/Social Workerr. Management plans discussed with the patient, family and they are in agreement.  CODE STATUS: DO NOT RESUSCITATE  TOTAL TIME TAKING CARE OF THIS PATIENT: 36 minutes.   POSSIBLE D/C TOMORROW, DEPENDING ON CLINICAL CONDITION.   Enid Baas M.D on 03/21/2016 at 2:11 PM  Between 7am to 6pm - Pager - [phone]  After 6pm go to [email or web address] - Social research officer, government  [facility]  Office  [phone]  CC: Primary care physician; Barbette Reichmann, MD

## 2016-03-21 NOTE — Care Management (Signed)
Received approval for H[facility].  Notified [facility] 

## 2016-03-21 NOTE — Progress Notes (Signed)
Physical Therapy Treatment Patient Details Name: Anita Ward MRN: [id number] DOB: 09/11/1919 Today's Date: 03/21/2016    History of Present Illness presented to ER secondary to chest pain, AMS; admitted with UTI, hyponatremia (upon admission 115; currently 125).    PT Comments    Pt agreeable to PT; voices complaints of acid reflux since early this morning. No other voiced complaints. Pt demonstrates increased ambulation distance with less assist required. Pt also demonstrates ability to negotiate up/down 3 steps without issue. Pt does require mild safety cues for chair approach and controlled sit, as pt abandons rolling walker for turn to chair and allows self to "plop" in chair versus reaching back and controlling descent. Pt does have an increase in heart rate post ambulation/steps to 145 beats per minute with resolve to 109 within 4 minutes. Nursing aware. Continue PT to progress activity tolerance and encourage safety for optimal, safe return home post acute care stay.   Follow Up Recommendations  Home health PT     Equipment Recommendations  Rolling walker with 5" wheels    Recommendations for Other Services       Precautions / Restrictions Precautions Precautions: Fall Restrictions Weight Bearing Restrictions: No    Mobility  Bed Mobility               General bed mobility comments: Not tested; up in chair  Transfers Overall transfer level: Needs assistance Equipment used: Rolling walker (2 wheeled) Transfers: Sit to/from Stand Sit to Stand: Supervision         General transfer comment: Cues for safe hand placement with sit and controlled descent. Pt does not lose balance, but rather allows self to "plop" down in chair.   Ambulation/Gait Ambulation/Gait assistance: Min guard Ambulation Distance (Feet): 300 Feet Assistive device: Rolling walker (2 wheeled) Gait Pattern/deviations: Step-through pattern;WFL(Within Functional Limits);Trunk flexed Gait  velocity: 10 ft walk in 6 seconds Gait velocity interpretation: at or above normal speed for age/gender General Gait Details: Pt feels mildly below baseline with mild weakness and slower speed, but no unssteadiness, dizziness or loss of balance noted or observed.    Stairs Stairs: Yes Stairs assistance: Min guard Stair Management: One rail Left;Alternating pattern;Step to pattern;Forwards;Sideways Number of Stairs: 3 General stair comments: Reciprocal ascending; step to sideways with BUEs on 1 rail descending  Wheelchair Mobility    Modified Rankin (Stroke Patients Only)       Balance Overall balance assessment: Needs assistance Sitting-balance support: Feet supported Sitting balance-Leahy Scale: Good     Standing balance support: Bilateral upper extremity supported Standing balance-Leahy Scale: Fair Standing balance comment: Can demonstrate static stand without BUE support, but not with mod challenge                    Cognition Arousal/Alertness: Awake/alert Behavior During Therapy: WFL for tasks assessed/performed Overall Cognitive Status: Within Functional Limits for tasks assessed       Memory: Decreased short-term memory              Exercises      General Comments        Pertinent Vitals/Pain Pain Assessment:  (C.o acid reflux pain in chest; medicated during session)    Home Living                      Prior Function            PT Goals (current goals can now be found in the care plan section)  Progress towards PT goals: Progressing toward goals    Frequency  Min 2X/week    PT Plan Current plan remains appropriate    Co-evaluation             End of Session Equipment Utilized During Treatment: Gait belt Activity Tolerance: Patient tolerated treatment well;Other (comment) (HR increased to 145 post ambulation/steps; down to 109 4 min) Patient left: in chair;with call bell/phone within reach;with chair alarm set;with  family/visitor present     Time: 6967-8938 PT Time Calculation (min) (ACUTE ONLY): 27 min  Charges:  $Gait Training: 23-37 mins                    G Codes:      Kristeen Miss, PTA 03/21/2016, 11:25 AM

## 2016-03-21 NOTE — Progress Notes (Signed)
Continues to complain of indigestion and "acid reflux". Constant intentional burping noted Prn meds given as ordered.

## 2016-03-21 NOTE — Progress Notes (Signed)
No complaints of pain.  Right groin with minimal bruising, no hematoma noted.

## 2016-03-21 NOTE — Progress Notes (Signed)
Patient HR on monitor observed to be elevated 150 during activities/ walking with physical therapist, at rest HR went down to 88. MD made aware, new order for metoprolol 25mg  p.o; given as ordered. Will continue to monitor. C/o indigestion new med order for pepcid.

## 2016-03-21 NOTE — Care Management (Signed)
Anita Ward is concern about patient returning home.  He questions whether patient can go to skilled nursing.  Patient at proesent does not meet criteria for skilled nursing.  she openly states in front of grandson and grandson's wife that she wants to go home. Says she can pay for someone to stay with her.  Provided grandson with list of agencies that can provide in home continuous care and discussed adding social work to the home health referral to assist with long range care planning. Anticipate discharge within the next 24 hours.  Will request HRI

## 2016-03-22 LAB — BASIC METABOLIC PANEL
ANION GAP: 4 — AB (ref 5–15)
BUN: 14 mg/dL (ref 6–20)
CALCIUM: 8.5 mg/dL — AB (ref 8.9–10.3)
CO2: 27 mmol/L (ref 22–32)
Chloride: 104 mmol/L (ref 101–111)
Creatinine, Ser: 0.76 mg/dL (ref 0.44–1.00)
GFR calc Af Amer: >60 mL/min (ref >60)
GFR calc non Af Amer: >60 mL/min (ref >60)
GLUCOSE: 99 mg/dL (ref 65–99)
POTASSIUM: 4.2 mmol/L (ref 3.5–5.1)
Sodium: 135 mmol/L (ref 135–145)

## 2016-03-22 MED ORDER — PANTOPRAZOLE SODIUM 40 MG PO TBEC: 40.0000 mg | DELAYED_RELEASE_TABLET | Freq: Two times a day (BID) | ORAL | 2 refills | Status: DC

## 2016-03-22 MED ORDER — FAMOTIDINE 20 MG PO TABS: 20.0000 mg | ORAL_TABLET | Freq: Two times a day (BID) | ORAL | 2 refills | Status: DC

## 2016-03-22 MED ORDER — METOPROLOL TARTRATE 25 MG PO TABS: 25.0000 mg | ORAL_TABLET | Freq: Two times a day (BID) | ORAL | 2 refills | Status: DC

## 2016-03-22 MED ORDER — ALPRAZOLAM 0.5 MG PO TABS: 0.2500 mg | ORAL_TABLET | Freq: Three times a day (TID) | ORAL | 0 refills | Status: AC | PRN

## 2016-03-22 MED ORDER — HYDRALAZINE HCL 50 MG PO TABS: 50.0000 mg | ORAL_TABLET | Freq: Four times a day (QID) | ORAL | 0 refills | Status: AC

## 2016-03-22 MED ORDER — CEPHALEXIN 250 MG PO CAPS: 250.0000 mg | ORAL_CAPSULE | Freq: Three times a day (TID) | ORAL | 0 refills | Status: DC

## 2016-03-22 NOTE — Discharge Instructions (Signed)
Fluid restriction to less than 1800cc/day

## 2016-03-22 NOTE — Care Management (Signed)
Notified [facility] of discharge home.  Have requested SN PT Aide and SW.

## 2016-03-22 NOTE — Progress Notes (Signed)
Patient assisted with care, well tolerated. Discharged home with home health care. Discharged instructions given and explained to patient and grandson at bedside. Script given to grandson as well. IV removed, site clean, dry and intact. Patient was wheeled out, no distress noted.

## 2016-03-22 NOTE — Discharge Summary (Signed)
[facility] [place] [facility]   PATIENT NAME: Anita Ward    MR#:  [id number]  DATE OF BIRTH:  12/13/1919  DATE OF ADMISSION:  03/19/2016   ADMITTING PHYSICIAN: Shaune PollackQing Chen, MD  DATE OF DISCHARGE: 03/22/2016  PRIMARY CARE PHYSICIAN: Barbette ReichmannHANDE,VISHWANATH, MD   ADMISSION DIAGNOSIS:   Confusion [R41.0] Hyponatremia [E87.1] UTI (lower urinary tract infection) [N39.0] Chest pain, unspecified chest pain type [R07.9]  DISCHARGE DIAGNOSIS:   Active Problems:   Hyponatremia   SECONDARY DIAGNOSIS:   Past Medical History:  Diagnosis Date  . Congestive heart failure (CHF) (HCC)   . Coronary artery disease   . Glaucoma   . HTN (hypertension)   . Osteoporosis     HOSPITAL COURSE:   82 y/o F with PMH of CAD, CHF, hypertension, osteoporosis admitted with weakness, confusion nad noted to have sodium of 115 and UTI  #1 Metabolic encephalopathy-secondary to UTI and low sodium. -Resolved. Sodium normalized with fluid restriction and normal saline initially. - Mental status is back to her baseline -On antibiotics for her infection. -CT of the head with atrophy and no acute abnormalities  #2 hyponatremia-likely from polydipsia-free water restriction as patient drinks plenty of water. -CT of the chest ordered to follow up her lung nodule which did not change -Continue to monitor sodium.   #3 acute cystitis-cultures are growing multiple species. -Was on Rocephin- changed to keflex  #4 hypertension-known history of uncontrolled high blood pressure. already on clonidine twice a day, losartan, Imdur. Added oral hydralazine and oral metoprolol. PCP follow-up in 1-2 weeks  #5 GERD- increased Protonix to twice a day. Also added Maalox and pepcid. Known history of hiatal hernia.  Worked well with physical therapy. Will be discharged home with home health. Patient has refused to go to any rehabilitation.  DISCHARGE CONDITIONS:   Guarded CONSULTS OBTAINED:     none  DRUG ALLERGIES:   Allergies  Allergen Reactions  . Benicar [Olmesartan] Other (See Comments)    Reaction:  Unknown   . Cardura [Doxazosin Mesylate] Other (See Comments)    Reaction:  Unknown   . Ciprofloxacin Other (See Comments)    Reaction:  Unknown  . Fosamax [Alendronate Sodium] Other (See Comments)    Reaction:  Unknown  . Lisinopril Other (See Comments)    Reaction:  Unknown   . Norvasc [Amlodipine Besylate] Other (See Comments)    Reaction:  Unknown   . Penicillins Other (See Comments)    Reaction:  Unknown   . Sulfa Antibiotics Other (See Comments)    Reaction:  Unknown   . Torsemide Anxiety   DISCHARGE MEDICATIONS:     Medication List    TAKE these medications   ALPRAZolam 0.5 MG tablet Commonly known as:  XANAX Take 0.5 tablets (0.25 mg total) by mouth 3 (three) times daily as needed for sleep or anxiety.   aspirin 81 MG EC tablet Take 1 tablet (81 mg total) by mouth daily. What changed:  when to take this   CALCIUM 600+D 600-400 MG-UNIT tablet Generic drug:  Calcium Carbonate-Vitamin D Take 1 tablet by mouth 2 (two) times daily.   cephALEXin 250 MG capsule Commonly known as:  KEFLEX Take 1 capsule (250 mg total) by mouth every 8 (eight) hours. X 4 more days Notes to patient:  Take at 6am, 2pm and 10pm for 4 days.   cholecalciferol 1000 units tablet Commonly known as:  VITAMIN D Take 1,000 Units by mouth every morning.   cloNIDine 0.3 MG  tablet Commonly known as:  CATAPRES Take 1 tablet (0.3 mg total) by mouth 2 (two) times daily.   clopidogrel 75 MG tablet Commonly known as:  PLAVIX Take 1 tablet (75 mg total) by mouth daily with breakfast.   clotrimazole-betamethasone cream Commonly known as:  LOTRISONE Apply 1 application topically 2 (two) times daily as needed.   CRANBERRY PO Take 1 capsule by mouth every morning.   famotidine 20 MG tablet Commonly known as:  PEPCID Take 1 tablet (20 mg total) by mouth 2 (two) times daily.    furosemide 40 MG tablet Commonly known as:  LASIX Take 1 tablet (40 mg total) by mouth at bedtime.   hydrALAZINE 50 MG tablet Commonly known as:  APRESOLINE Take 1 tablet (50 mg total) by mouth every 6 (six) hours. What changed:  when to take this  reasons to take this   isosorbide mononitrate 30 MG 24 hr tablet Commonly known as:  IMDUR Take 30 mg by mouth 2 (two) times daily.   levothyroxine 25 MCG tablet Commonly known as:  SYNTHROID, LEVOTHROID Take 25 mcg by mouth every morning.   losartan 100 MG tablet Commonly known as:  COZAAR Take 1 tablet (100 mg total) by mouth daily. What changed:  when to take this   Magnesium 200 MG Tabs Take 200 mg by mouth 2 (two) times daily.   metoprolol tartrate 25 MG tablet Commonly known as:  LOPRESSOR Take 1 tablet (25 mg total) by mouth 2 (two) times daily.   nitroGLYCERIN 0.4 MG SL tablet Commonly known as:  NITROSTAT Place 0.4 mg under the tongue every 5 (five) minutes x 3 doses as needed for chest pain.   pantoprazole 40 MG tablet Commonly known as:  PROTONIX Take 1 tablet (40 mg total) by mouth 2 (two) times daily. What changed:  when to take this   Potassium Chloride ER 20 MEQ Tbcr Take 20 mEq by mouth every morning.   simvastatin 10 MG tablet Commonly known as:  ZOCOR Take 1 tablet (10 mg total) by mouth daily at 6 PM.   traMADol 50 MG tablet Commonly known as:  ULTRAM Take 50 mg by mouth 3 (three) times daily as needed for moderate pain.   vitamin B-12 1000 MCG tablet Commonly known as:  CYANOCOBALAMIN Take 1,000 mcg by mouth every morning.        DISCHARGE INSTRUCTIONS:   1. Home health 2. PCP follow-up in 1 week for blood pressure monitoring  DIET:   Cardiac diet  ACTIVITY:   Activity as tolerated  OXYGEN:   Home Oxygen: No.  Oxygen Delivery: room air  DISCHARGE LOCATION:   home   If you experience worsening of your admission symptoms, develop shortness of breath, life threatening  emergency, suicidal or homicidal thoughts you must seek medical attention immediately by calling 911 or calling your MD immediately  if symptoms less severe.  You Must read complete instructions/literature along with all the possible adverse reactions/side effects for all the Medicines you take and that have been prescribed to you. Take any new Medicines after you have completely understood and accpet all the possible adverse reactions/side effects.   Please note  You were cared for by a hospitalist during your hospital stay. If you have any questions about your discharge medications or the care you received while you were in the hospital after you are discharged, you can call the unit and asked to speak with the hospitalist on call if the hospitalist that took care  of you is not available. Once you are discharged, your primary care physician will handle any further medical issues. Please note that NO REFILLS for any discharge medications will be authorized once you are discharged, as it is imperative that you return to your primary care physician (or establish a relationship with a primary care physician if you do not have one) for your aftercare needs so that they can reassess your need for medications and monitor your lab values.    On the day of Discharge:  VITAL SIGNS:   Blood pressure (!) 179/71, pulse 88, temperature 98.3 F (36.8 C), temperature source Oral, resp. rate 18, height 5\' 2"  (1.575 m), weight 55.3 kg (122 lb), SpO2 98 %.  PHYSICAL EXAMINATION:   GENERAL:  82 y.o.-year-old patient lying in the bed with no acute distress.  EYES: Pupils equal, round, reactive to light and accommodation. No scleral icterus. Extraocular muscles intact.  HEENT: Head atraumatic, normocephalic. Oropharynx and nasopharynx clear.  NECK:  Supple, no jugular venous distention. No thyroid enlargement, no tenderness.  LUNGS: Normal breath sounds bilaterally, no wheezing, rales,rhonchi or crepitation. No  use of accessory muscles of respiration.  CARDIOVASCULAR: S1, S2 normal. No  rubs, or gallops. 3/6 systolic murmur is present ABDOMEN: Soft, nontender, nondistended. Bowel sounds present. No organomegaly or mass.  EXTREMITIES: No pedal edema, cyanosis, or clubbing.  NEUROLOGIC: Cranial nerves II through XII are intact. Muscle strength 5/5 in all extremities. Sensation intact. Gait not checked. Global Weakness. PSYCHIATRIC: The patient is alert and oriented x 3.  SKIN: No obvious rash, lesion, or ulcer.   DATA REVIEW:   CBC  Recent Labs Lab 03/21/16 0335  WBC 12.7*  HGB 11.8*  HCT 33.2*  PLT 372    Chemistries   Recent Labs Lab 03/19/16 1017 03/19/16 1532  03/22/16 0403  NA 115*  --   < > 135  K 3.5  --   < > 4.2  CL 76*  --   < > 104  CO2 31  --   < > 27  GLUCOSE 109*  --   < > 99  BUN 21*  --   < > 14  CREATININE 0.72  --   < > 0.76  CALCIUM 9.3  --   < > 8.5*  MG  --  2.1  --   --   AST 21  --   --   --   ALT 14  --   --   --   ALKPHOS 59  --   --   --   BILITOT 0.9  --   --   --   < > = values in this interval not displayed.   Microbiology Results  Results for orders placed or performed during the hospital encounter of 03/19/16  Urine culture     Status: Abnormal   Collection Time: 03/19/16 11:08 AM  Result Value Ref Range Status   Specimen Description URINE, CLEAN CATCH  Final   Special Requests NONE  Final   Culture MULTIPLE SPECIES PRESENT, SUGGEST RECOLLECTION (A)  Final   Report Status 12/24/202017 FINAL  Final  Blood culture (routine x 2)     Status: None (Preliminary result)   Collection Time: 03/19/16 11:55 AM  Result Value Ref Range Status   Specimen Description BLOOD RIGHT HAND  Final   Special Requests   Final    BOTTLES DRAWN AEROBIC AND ANAEROBIC  5CCAERO, 3CCANA   Culture NO GROWTH 3 DAYS  Final  Report Status PENDING  Incomplete  Blood culture (routine x 2)     Status: None (Preliminary result)   Collection Time: 03/19/16 12:00 PM    Result Value Ref Range Status   Specimen Description BLOOD RIGHT FATTY CASTS  Final   Special Requests   Final    BOTTLES DRAWN AEROBIC AND ANAEROBIC  3CCAERO,2CCANA   Culture NO GROWTH 3 DAYS  Final   Report Status PENDING  Incomplete    RADIOLOGY:  No results found.   Management plans discussed with the patient, family and they are in agreement.  CODE STATUS:     Code Status Orders        Start     Ordered   03/19/16 1434  Do not attempt resuscitation (DNR)  Continuous    Question Answer Comment  In the event of cardiac or respiratory ARREST Do not call a "code blue"   In the event of cardiac or respiratory ARREST Do not perform Intubation, CPR, defibrillation or ACLS   In the event of cardiac or respiratory ARREST Use medication by any route, position, wound care, and other measures to relive pain and suffering. May use oxygen, suction and manual treatment of airway obstruction as needed for comfort.      03/19/16 1433    Code Status History    Date Active Date Inactive Code Status Order ID Comments User Context   02/19/2016  2:13 PM 02/22/2016  5:42 PM DNR [id number]  Wyatt Haste, MD ED   02/19/2016  2:13 PM 02/19/2016  2:13 PM Full Code [id number]  Wyatt Haste, MD ED   05/27/2015  2:17 AM 05/29/2015  3:14 PM Full Code [id number]  Arnaldo Natal, MD Inpatient   03/15/2015 11:12 AM 03/19/2015  3:05 PM DNR [id number]  Suan Halter, MD Inpatient   03/12/2015  8:16 PM 03/15/2015 11:11 AM Full Code [id number]  Altamese Dilling, MD Inpatient    Advance Directive Documentation   Flowsheet Row Most Recent Value  Type of Advance Directive  Healthcare Power of Attorney, Living will  Pre-existing out of facility DNR order (yellow form or pink MOST form)  No data  "MOST" Form in Place?  No data      TOTAL TIME TAKING CARE OF THIS PATIENT: 37 minutes.    Enid Baas M.D on 03/22/2016 at 12:32 PM  Between 7am to 6pm - Pager - [phone]  After 6pm go to  [email or web address] - Social research officer, government  [facility]  Office  [phone]  CC: Primary care physician; Barbette Reichmann, MD   Note: This dictation was prepared with Dragon dictation along with smaller phrase technology. Any transcriptional errors that result from this process are unintentional.

## 2016-03-22 NOTE — Progress Notes (Signed)
Staff received a call from [facility] regarding patient allergy to Keflex. Patient has been taken Keflex over here with no allergic reaction noted. Attending physician made aware,  patient to continue with keflex, no changes made.

## 2016-03-22 NOTE — Progress Notes (Signed)
Physical Therapy Treatment Patient Details Name: Anita Ward MRN: [id number] DOB: 1919-07-24 Today's Date: 03/22/2016    History of Present Illness      PT Comments    Pt agreeable to PT; continues to complains of acid reflux. Pt wishes to finish her morning routine at the sink. Pt does so with supervision and 1 upper extremity support. Pt with improved heart rate during ambulation today with conscious focus on breathing and need for rest. Pt participates in seated exercises as well with education for use at home. Pt progressing goals; current discharge order to home with HHPT follow up.   Follow Up Recommendations  Home health PT     Equipment Recommendations  Rolling walker with 5" wheels    Recommendations for Other Services       Precautions / Restrictions Precautions Precautions: Fall Restrictions Weight Bearing Restrictions: No    Mobility  Bed Mobility               General bed mobility comments: Not tested; up in chair  Transfers Overall transfer level: Modified independent Equipment used: Rolling walker (2 wheeled) Transfers: Sit to/from Stand Sit to Stand: Modified independent (Device/Increase time)         General transfer comment: Better use of hands today to control descent into chair  Ambulation/Gait Ambulation/Gait assistance: Supervision Ambulation Distance (Feet): 200 Feet Assistive device: Rolling walker (2 wheeled) Gait Pattern/deviations: WFL(Within Functional Limits)   Gait velocity interpretation: at or above normal speed for age/gender General Gait Details: conscious of speed/breathing today and need for rest to manage heart rate. HR btn 108 and 112 with ambulation today. Preferred rest post 200 ft   Stairs            Wheelchair Mobility    Modified Rankin (Stroke Patients Only)       Balance Overall balance assessment: Needs assistance Sitting-balance support: Feet supported Sitting balance-Leahy Scale: Good      Standing balance support: Bilateral upper extremity supported;Single extremity supported (No upper extremity support is F) Standing balance-Leahy Scale: Good Standing balance comment: Pt demonstrates ability to maintain balance with 1 UE support during functional activities (brushing teeth, washing hands, reaching or towel).                     Cognition Arousal/Alertness: Awake/alert Behavior During Therapy: WFL for tasks assessed/performed Overall Cognitive Status: Within Functional Limits for tasks assessed       Memory: Decreased short-term memory              Exercises Other Exercises Other Exercises: Participates in functional stand activities and seated heel/toe, march, FAQ, hip ABD/ADD and SLR 10x each    General Comments        Pertinent Vitals/Pain      Home Living                      Prior Function            PT Goals (current goals can now be found in the care plan section) Progress towards PT goals: Progressing toward goals    Frequency  Min 2X/week    PT Plan Current plan remains appropriate    Co-evaluation             End of Session Equipment Utilized During Treatment: Gait belt Activity Tolerance: Patient tolerated treatment well;Other (comment) Patient left: in chair;with call bell/phone within reach;with chair alarm set     Time: [phone]  PT Time Calculation (min) (ACUTE ONLY): 27 min  Charges:  $Gait Training: 8-22 mins $Therapeutic Exercise: 8-22 mins                    G Codes:      Scot DockHeidi E Serine Kea, PTA 03/22/2016, 12:07 PM

## 2016-03-24 LAB — CULTURE, BLOOD (ROUTINE X 2)
Culture: NO GROWTH
Culture: NO GROWTH

## 2016-04-28 ENCOUNTER — Ambulatory Visit (INDEPENDENT_AMBULATORY_CARE_PROVIDER_SITE_OTHER): Payer: Medicare Other | Admitting: Podiatry

## 2016-04-28 ENCOUNTER — Encounter: Payer: Self-pay | Admitting: Podiatry

## 2016-04-28 ENCOUNTER — Telehealth: Payer: Self-pay | Admitting: Podiatry

## 2016-04-28 VITALS — BP 165/82 | HR 71 | Resp 18

## 2016-04-28 DIAGNOSIS — L600 Ingrowing nail: Secondary | ICD-10-CM | POA: Diagnosis not present

## 2016-04-28 DIAGNOSIS — L03039 Cellulitis of unspecified toe: Secondary | ICD-10-CM | POA: Diagnosis not present

## 2016-04-28 DIAGNOSIS — M79676 Pain in unspecified toe(s): Secondary | ICD-10-CM | POA: Diagnosis not present

## 2016-04-28 NOTE — Patient Instructions (Signed)

## 2016-04-28 NOTE — Progress Notes (Signed)
   Subjective:    Patient ID: Anita Ward, female    DOB: 1919/10/30, 82 y.o.   MRN: [id number]  HPI    Review of Systems  HENT: Positive for tinnitus.   Eyes: Positive for itching and visual disturbance.  Respiratory: Positive for shortness of breath.   Cardiovascular: Positive for palpitations and leg swelling.  Genitourinary: Positive for urgency.  Musculoskeletal: Positive for back pain.  All other systems reviewed and are negative.      Objective:   Physical Exam        Assessment & Plan:

## 2016-04-28 NOTE — Progress Notes (Signed)
Patient ID: Anita Ward, female   DOB: Dec 06, 1919, 82 y.o.   MRN: [id number] Subjective: Patient presents today for evaluation of pain in her toe(s). Patient is concerned for possible ingrown nail. Patient states that the pain has been present for a few weeks now. Patient presents today for further treatment and evaluation.  Objective:  General: Well developed, nourished, in no acute distress, alert and oriented x3   Dermatology: Skin is warm, dry and supple bilateral. Medial border of the right great toe appears to be erythematous with evidence of an ingrowing nail. Purulent drainage noted with intruding nail into the respective nail fold. Pain on palpation noted to the border of the nail fold. The remaining nails appear unremarkable at this time. There are no open sores, lesions.  Vascular: Dorsalis Pedis artery and Posterior Tibial artery pedal pulses palpable. No lower extremity edema noted.   Neruologic: Grossly intact via light touch bilateral.  Musculoskeletal: Muscular strength within normal limits in all groups bilateral. Normal range of motion noted to all pedal and ankle joints.   Assesement: #1 paronychia right great toe medial border #2 pain in right great toe #3 ingrowing nail right great toe medial border #4 onychocryptosis right great toe medial border   Plan of Care:  1. Patient evaluated.  2. Discussed treatment alternatives and plan of care. Explained nail avulsion procedure and post procedure course to patient. 3. Patient opted for permanent partial nail avulsion.  4. Prior to procedure, local anesthesia infiltration utilized using 3 ml of a 50:50 mixture of 2% plain lidocaine and 0.5% plain marcaine in a normal hallux block fashion and a betadine prep performed.  5. Partial permanent nail avulsion with chemical matrixectomy performed using 3x30sec applications of phenol followed by alcohol flush.  6. Light dressing applied. 7. Return to clinic in 2 weeks.   Dr.  Felecia Shelling [facility]

## 2016-04-28 NOTE — Telephone Encounter (Signed)
pts family member called back and pt is coming today at 130pm arrive at 115pm.

## 2016-04-28 NOTE — Telephone Encounter (Signed)
pts grandson Reuel Boom) called and lvm at 854am on nurse line to see if pt could come in today.  I called and pt states Reuel Boom has left and she would have to see if she could get a ride for the appt today at 130pm. She is calling back to let me know. If not pt is scheduled to see Dr Logan Bores on 10.24.17 at 930am.

## 2016-05-02 ENCOUNTER — Ambulatory Visit: Payer: Medicare Other | Admitting: Podiatry

## 2016-05-12 ENCOUNTER — Ambulatory Visit (INDEPENDENT_AMBULATORY_CARE_PROVIDER_SITE_OTHER): Payer: Medicare Other | Admitting: Podiatry

## 2016-05-12 VITALS — BP 167/76 | HR 90 | Resp 16

## 2016-05-12 DIAGNOSIS — L03039 Cellulitis of unspecified toe: Secondary | ICD-10-CM

## 2016-05-12 DIAGNOSIS — M79676 Pain in unspecified toe(s): Secondary | ICD-10-CM

## 2016-05-12 DIAGNOSIS — S91109D Unspecified open wound of unspecified toe(s) without damage to nail, subsequent encounter: Secondary | ICD-10-CM

## 2016-05-12 MED ORDER — MUPIROCIN CALCIUM 2 % EX CREA: 1.0000 "application " | TOPICAL_CREAM | Freq: Two times a day (BID) | CUTANEOUS | 1 refills | Status: DC

## 2016-05-13 NOTE — Progress Notes (Signed)
Subjective: Patient presents today 2 weeks post ingrown nail permanent nail avulsion procedure. Patient states that the toe and nail fold is feeling much better.  Objective: Skin is warm, dry and supple. Nail and respective nail fold appears to be healing appropriately. Open wound to the associated nail fold with a granular wound base and moderate amount of fibrotic tissue. Minimal drainage noted. Mild erythema around the periungual region likely due to phenol chemical matricectomy.  Assessment: #1 postop permanent partial nail avulsion #2 open wound periungual nail fold of respective digit.   Plan of care: #1 patient was evaluated  #2 debridement of open wound was performed to the periungual border of the respective toe using a currette. Antibiotic ointment and Band-Aid was applied. #3 prescription for mupirocin 2% cream placed #4 patient is to return to clinic on a PRN  basis.

## 2016-07-20 ENCOUNTER — Emergency Department
Admission: EM | Admit: 2016-07-20 | Discharge: 2016-07-20 | Disposition: A | Discharge status: Home / Self Care | Payer: Medicare Other | Attending: Emergency Medicine | Admitting: Emergency Medicine

## 2016-07-20 ENCOUNTER — Emergency Department: Payer: Medicare Other

## 2016-07-20 ENCOUNTER — Encounter: Payer: Self-pay | Admitting: Emergency Medicine

## 2016-07-20 DIAGNOSIS — R519 Headache, unspecified: Secondary | ICD-10-CM

## 2016-07-20 DIAGNOSIS — I509 Heart failure, unspecified: Secondary | ICD-10-CM | POA: Diagnosis not present

## 2016-07-20 DIAGNOSIS — I110 Hypertensive heart disease with heart failure: Secondary | ICD-10-CM | POA: Insufficient documentation

## 2016-07-20 DIAGNOSIS — Z951 Presence of aortocoronary bypass graft: Secondary | ICD-10-CM | POA: Diagnosis not present

## 2016-07-20 DIAGNOSIS — R51 Headache: Secondary | ICD-10-CM | POA: Diagnosis present

## 2016-07-20 DIAGNOSIS — I252 Old myocardial infarction: Secondary | ICD-10-CM | POA: Insufficient documentation

## 2016-07-20 DIAGNOSIS — Z79899 Other long term (current) drug therapy: Secondary | ICD-10-CM | POA: Diagnosis not present

## 2016-07-20 DIAGNOSIS — Z7982 Long term (current) use of aspirin: Secondary | ICD-10-CM | POA: Insufficient documentation

## 2016-07-20 DIAGNOSIS — I2510 Atherosclerotic heart disease of native coronary artery without angina pectoris: Secondary | ICD-10-CM | POA: Diagnosis not present

## 2016-07-20 DIAGNOSIS — I10 Essential (primary) hypertension: Secondary | ICD-10-CM

## 2016-07-20 HISTORY — DX: Anxiety disorder, unspecified: F41.9

## 2016-07-20 LAB — BASIC METABOLIC PANEL
Anion gap: 7 (ref 5–15)
BUN: 18 mg/dL (ref 6–20)
CALCIUM: 8.9 mg/dL (ref 8.9–10.3)
CHLORIDE: 96 mmol/L — AB (ref 101–111)
CO2: 30 mmol/L (ref 22–32)
Creatinine, Ser: 0.93 mg/dL (ref 0.44–1.00)
GFR calc non Af Amer: 51 mL/min — ABNORMAL LOW (ref >60)
GFR, EST AFRICAN AMERICAN: 59 mL/min — AB (ref >60)
GLUCOSE: 99 mg/dL (ref 65–99)
Potassium: 3.7 mmol/L (ref 3.5–5.1)
Sodium: 133 mmol/L — ABNORMAL LOW (ref 135–145)

## 2016-07-20 LAB — CBC WITH DIFFERENTIAL/PLATELET
Basophils Absolute: 0.1 10*3/uL (ref 0–0.1)
Basophils Relative: 1 %
EOS ABS: 0.2 10*3/uL (ref 0–0.7)
Eosinophils Relative: 2 %
HCT: 34.5 % — ABNORMAL LOW (ref 35.0–47.0)
HEMOGLOBIN: 11.7 g/dL — AB (ref 12.0–16.0)
LYMPHS ABS: 1.4 10*3/uL (ref 1.0–3.6)
Lymphocytes Relative: 18 %
MCH: 27 pg (ref 26.0–34.0)
MCHC: 33.9 g/dL (ref 32.0–36.0)
MCV: 79.6 fL — ABNORMAL LOW (ref 80.0–100.0)
MONO ABS: 0.7 10*3/uL (ref 0.2–0.9)
MONOS PCT: 9 %
NEUTROS PCT: 70 %
Neutro Abs: 5.5 10*3/uL (ref 1.4–6.5)
Platelets: 265 10*3/uL (ref 150–440)
RBC: 4.33 MIL/uL (ref 3.80–5.20)
RDW: 14.9 % — AB (ref 11.5–14.5)
WBC: 7.9 10*3/uL (ref 3.6–11.0)

## 2016-07-20 LAB — TROPONIN I: Troponin I: <0.03 ng/mL (ref <0.03)

## 2016-07-20 NOTE — ED Provider Notes (Signed)
[facility] Emergency Department Provider Note ____________________________________________   I have reviewed the triage vital signs and the triage nursing note.  HISTORY  Chief Complaint Headache and Hypertension   Historian Patient  HPI Anita Ward is a 83 y.o. female lives alone independently, but states her grandson puts her daily medications together, has a history of congestive heart failure coronary artery disease, hypertension, and prior MI, presents due to frequent and worsening headaches with elevated blood pressures what sounds like over the past 2 weeks or so. She states that often to wake up in the middle the night having a bad headache and took her blood pressure and it's 200 over 100s. She does have a history of high blood pressure and reports compliance with her medication. She states she saw urgent care yesterday and was told she might have a urinary tract infection, but was waiting to hear back formally.  Denies weakness or numbness, and states that the headaches are not necessarily associated with stressors. She has not recently had sinus congestion or fevers or body aches. No chest pain or palpitations. No shortness of breath. No focal weakness or numbness.  Currently headache is essentially resolved, although she states that she feels like it could come back.  Today when she found her blood pressure being elevated she did take an extra hydralazine dose.    Past Medical History:  Diagnosis Date  . Anxiety   . Congestive heart failure (CHF) (HCC)   . Coronary artery disease   . Glaucoma   . HTN (hypertension)   . Osteoporosis     Patient Active Problem List   Diagnosis Date Noted  . Chest pain 02/19/2016  . Hyponatremia 02/19/2016  . MI (myocardial infarction) 05/27/2015    Past Surgical History:  Procedure Laterality Date  . CARDIAC SURGERY    . CARDIAC SURGERY    . CORONARY ARTERY BYPASS GRAFT      Prior to Admission  medications   Medication Sig Start Date End Date Taking? Authorizing Provider  ALPRAZolam Prudy Feeler) 0.5 MG tablet Take 0.5 tablets (0.25 mg total) by mouth 3 (three) times daily as needed for sleep or anxiety. 03/22/16 03/22/17  Enid Baas, MD  aspirin EC 81 MG EC tablet Take 1 tablet (81 mg total) by mouth daily. Patient taking differently: Take 81 mg by mouth every morning.  05/29/15   Altamese Dilling, MD  Calcium Carbonate-Vitamin D (CALCIUM 600+D) 600-400 MG-UNIT tablet Take 1 tablet by mouth 2 (two) times daily.    Historical Provider, MD  cholecalciferol (VITAMIN D) 1000 UNITS tablet Take 1,000 Units by mouth every morning.     Historical Provider, MD  cloNIDine (CATAPRES) 0.3 MG tablet Take 1 tablet (0.3 mg total) by mouth 2 (two) times daily. 02/22/16   Houston Siren, MD  clopidogrel (PLAVIX) 75 MG tablet Take 1 tablet (75 mg total) by mouth daily with breakfast. 05/29/15   Altamese Dilling, MD  clotrimazole-betamethasone (LOTRISONE) cream Apply 1 application topically 2 (two) times daily as needed.     Historical Provider, MD  CRANBERRY PO Take 1 capsule by mouth every morning.     Historical Provider, MD  famotidine (PEPCID) 20 MG tablet Take 1 tablet (20 mg total) by mouth 2 (two) times daily. 03/22/16   Enid Baas, MD  furosemide (LASIX) 40 MG tablet Take 1 tablet (40 mg total) by mouth at bedtime. 02/03/16 02/02/17  Myrna Blazer, MD  hydrALAZINE (APRESOLINE) 50 MG tablet Take 1 tablet (50  mg total) by mouth every 6 (six) hours. 03/22/16   Enid Baas, MD  isosorbide mononitrate (IMDUR) 30 MG 24 hr tablet Take 30 mg by mouth 2 (two) times daily.    Historical Provider, MD  levothyroxine (SYNTHROID, LEVOTHROID) 25 MCG tablet Take 25 mcg by mouth every morning.     Historical Provider, MD  losartan (COZAAR) 100 MG tablet Take 1 tablet (100 mg total) by mouth daily. Patient taking differently: Take 100 mg by mouth every morning.  02/22/16   Houston Siren,  MD  Magnesium 200 MG TABS Take 200 mg by mouth 2 (two) times daily.    Historical Provider, MD  metoprolol tartrate (LOPRESSOR) 25 MG tablet Take 1 tablet (25 mg total) by mouth 2 (two) times daily. 03/22/16   Enid Baas, MD  mupirocin cream (BACTROBAN) 2 % Apply 1 application topically 2 (two) times daily. 05/12/16   Felecia Shelling, DPM  nitroGLYCERIN (NITROSTAT) 0.4 MG SL tablet Place 0.4 mg under the tongue every 5 (five) minutes x 3 doses as needed for chest pain.     Historical Provider, MD  pantoprazole (PROTONIX) 40 MG tablet Take 1 tablet (40 mg total) by mouth 2 (two) times daily. 03/22/16   Enid Baas, MD  Potassium Chloride ER 20 MEQ TBCR Take 20 mEq by mouth every morning.     Historical Provider, MD  simvastatin (ZOCOR) 10 MG tablet Take 1 tablet (10 mg total) by mouth daily at 6 PM. 05/29/15   Altamese Dilling, MD  traMADol (ULTRAM) 50 MG tablet Take 50 mg by mouth 3 (three) times daily as needed for moderate pain.    Historical Provider, MD  vitamin B-12 (CYANOCOBALAMIN) 1000 MCG tablet Take 1,000 mcg by mouth every morning.     Historical Provider, MD    Allergies  Allergen Reactions  . Benicar [Olmesartan] Other (See Comments)    Reaction:  Unknown   . Cardura [Doxazosin Mesylate] Other (See Comments)    Reaction:  Unknown   . Ciprofloxacin Other (See Comments)    Reaction:  Unknown  . Fosamax [Alendronate Sodium] Other (See Comments)    Reaction:  Unknown  . Lisinopril Other (See Comments)    Reaction:  Unknown   . Norvasc [Amlodipine Besylate] Other (See Comments)    Reaction:  Unknown   . Penicillins Other (See Comments)    Reaction:  Unknown   . Sulfa Antibiotics Other (See Comments)    Reaction:  Unknown   . Torsemide Anxiety    Family History  Problem Relation Age of Onset  . Congestive Heart Failure Father   . CAD Brother     Social History Social History  Substance Use Topics  . Smoking status: Never Smoker  . Smokeless tobacco: Never  Used  . Alcohol use No    Review of Systems  Constitutional: Negative for fever. Eyes: Negative for visual changes. ENT: Negative for sore throat. Cardiovascular: Negative for chest pain. Respiratory: Negative for shortness of breath. Gastrointestinal: Negative for abdominal pain, vomiting and diarrhea. Genitourinary: Negative for dysuria. Musculoskeletal: Negative for back pain. Skin: Negative for rash. Neurological: Positive for headaches as per history of present illness.. 10 point Review of Systems otherwise negative ____________________________________________   PHYSICAL EXAM:  VITAL SIGNS: ED Triage Vitals  Enc Vitals Group     BP 07/20/16 1442 (!) 159/68     Pulse Rate 07/20/16 1442 63     Resp 07/20/16 1442 18     Temp 07/20/16 1442 98.3 F (  36.8 C)     Temp Source 07/20/16 1442 Oral     SpO2 07/20/16 1442 96 %     Weight 07/20/16 1443 130 lb (59 kg)     Height 07/20/16 1443 5\' 1"  (1.549 m)     Head Circumference --      Peak Flow --      Pain Score 07/20/16 1444 4     Pain Loc --      Pain Edu? --      Excl. in GC? --      Constitutional: Alert and oriented. Well appearing and in no distress. HEENT   Head: Normocephalic and atraumatic.      Eyes: Conjunctivae are normal. PERRL. Normal extraocular movements.      Ears:         Nose: No congestion/rhinnorhea.   Mouth/Throat: Mucous membranes are moist.   Neck: No stridor. Cardiovascular/Chest: Normal rate, regular rhythm.  No murmurs, rubs, or gallops. Respiratory: Normal respiratory effort without tachypnea nor retractions. Breath sounds are clear and equal bilaterally. No wheezes/rales/rhonchi. Gastrointestinal: Soft. No distention, no guarding, no rebound. Nontender.    Genitourinary/rectal:Deferred Musculoskeletal: Nontender with normal range of motion in all extremities. No joint effusions.  No lower extremity tenderness.  No edema. Neurologic:  Normal speech and language. No gross or focal  neurologic deficits are appreciated. Skin:  Skin is warm, dry and intact. No rash noted. Psychiatric: Mood and affect are normal. Speech and behavior are normal. Patient exhibits appropriate insight and judgment.   ____________________________________________  LABS (pertinent positives/negatives)  Labs Reviewed  TROPONIN I  BASIC METABOLIC PANEL  CBC WITH DIFFERENTIAL/PLATELET    ____________________________________________    EKG I, Governor Rooks, MD, the attending physician have personally viewed and interpreted all ECGs.  60 bpm. Normal sinus rhythm with first degree AV block. Left axis deviation. Left bundle branch block. ____________________________________________  RADIOLOGY All Xrays were viewed by me. Imaging interpreted by Radiologist.  CT head without contrast:  IMPRESSION: No acute abnormality.  Mild atrophy and chronic microvascular ischemic change.  Atherosclerosis. __________________________________________  PROCEDURES  Procedure(s) performed: None  Critical Care performed: None  ____________________________________________   ED COURSE / ASSESSMENT AND PLAN  Pertinent labs & imaging results that were available during my care of the patient were reviewed by me and considered in my medical decision making (see chart for details).   Ms. Battenfield is an independent living 83 year old who is reporting new headaches over the last 2 weeks, increasing in frequency, no seizures resolved at this point, but given her age and worsening symptoms with associated hypotension, I did discuss basic laboratory evaluation and CT of the head.  In terms of blood pressure, her blood pressure is improved now, she did take an extra hydralazine. I think this was a reasonable course of action on her part and did improve her symptoms of blood pressure being elevated and headache.  Was able to review the urinalysis from yesterday, rare bacteria in the setting of squamous cells and  no significant white blood cells. I don't think that she has indication of active urinary tract infection.   Motor studies are reassuring. Her head CT is reassuring with no acute findings. We discussed okay for discharge tonight. Recommending follow-up closely with her primary care doctor.    CONSULTATIONS:   None  Patient / Family / Caregiver informed of clinical course, medical decision-making process, and agree with plan.   I discussed return precautions, follow-up instructions, and discharge instructions with patient and/or  family.   ___________________________________________   FINAL CLINICAL IMPRESSION(S) / ED DIAGNOSES   Final diagnoses:  Headache, unspecified headache type  Essential hypertension              Note: This dictation was prepared with Dragon dictation. Any transcriptional errors that result from this process are unintentional    Governor Rooks, MD 07/20/16 1811

## 2016-07-20 NOTE — Discharge Instructions (Addendum)
You exam and evaluation are reassuring in the emergency department today.  You may take one hydralazine tablet (early) if you are having blood pressure greater than 200 on the top or 100 on the bottom.  Return to the emergency room for any blood pressure greater than 220 on the top of 120 on the bottom, or any new or worsening headache, any confusion or altered mental status, dizziness or passing out, chest pain, palpitations, weakness or numbness, or any other symptoms concerning to you.

## 2016-07-20 NOTE — ED Triage Notes (Addendum)
Patient from home via [facility] complaining of headache x1 week. Reports she has noticed her BP increasing over the past few days.Patient has history of HTN and anxiety. History of similar episodes in the past. Reports compliance with BP medicine. Denies CP or SOB. Reports home BP of 212/110.

## 2017-04-19 IMAGING — CR DG CHEST 1V
1 series · 1 of 1 positions shown · non-contrast
Comparison: 03/12/2015

CLINICAL DATA: Hypertension and coronary artery disease. Nauseated.

EXAM:
CHEST  1 VIEW

[ap]
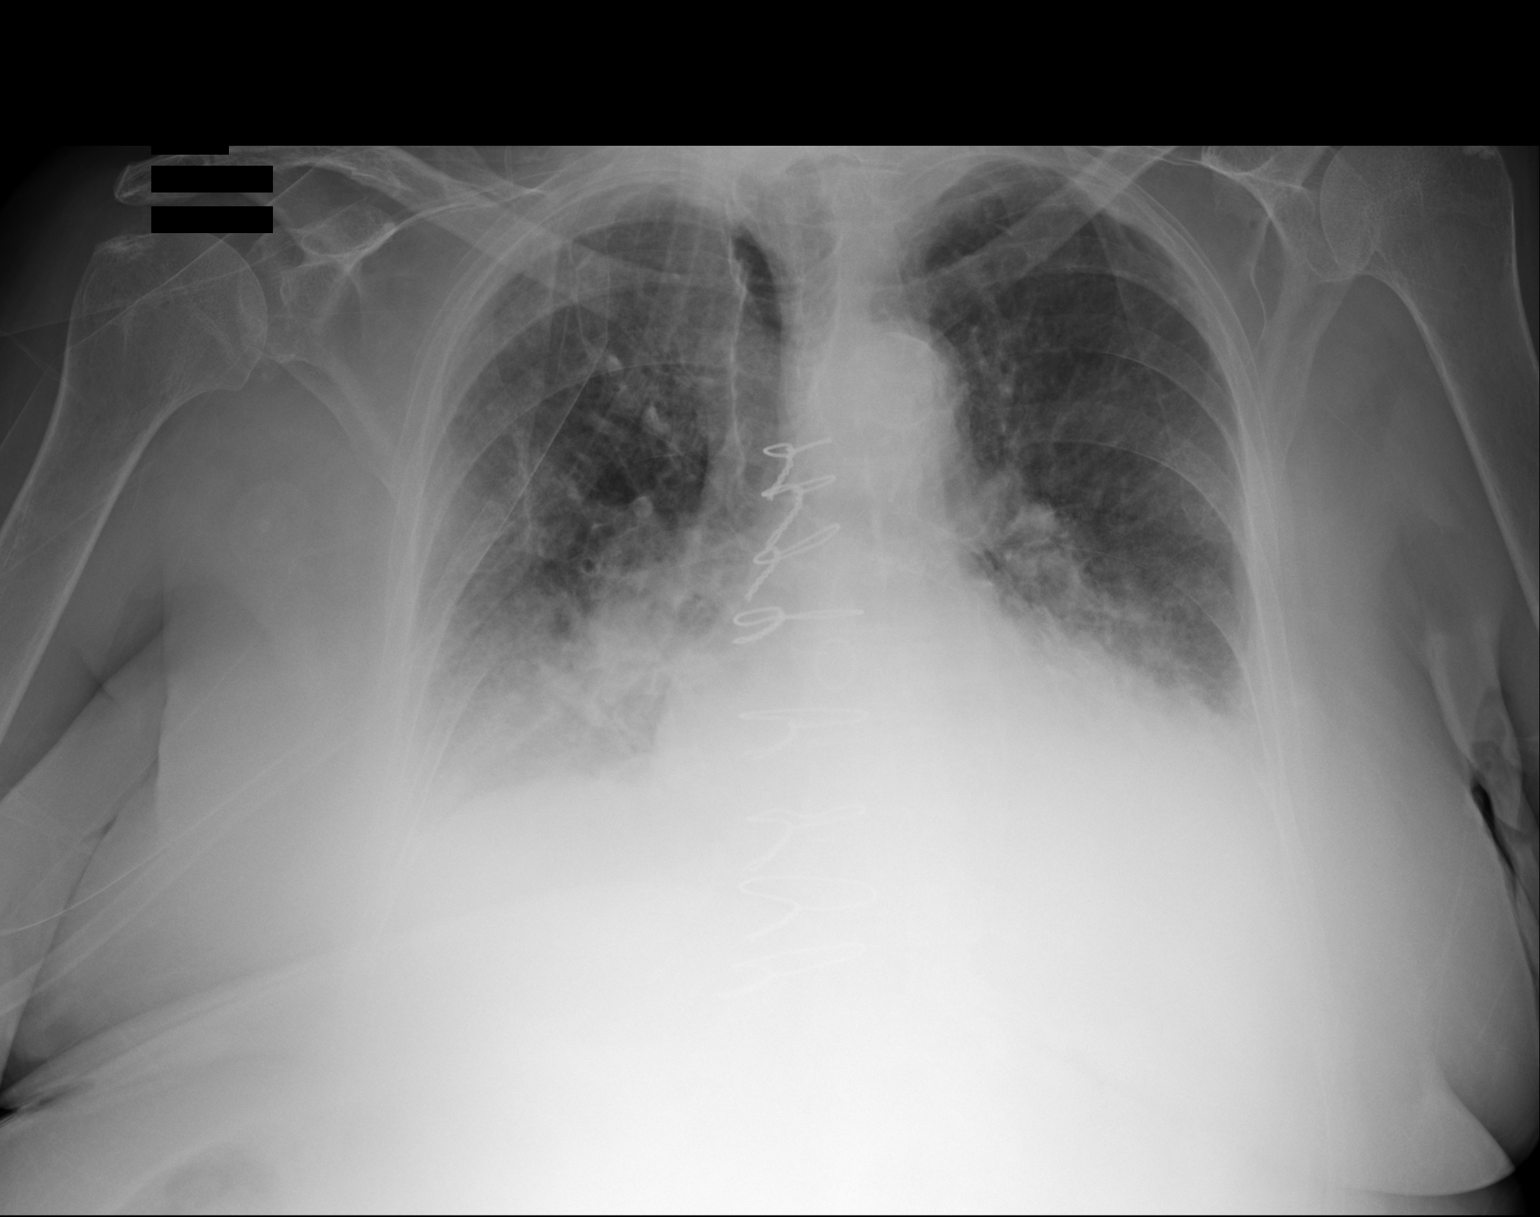

[1 of 1 positions shown; findings below may reference images not displayed]

FINDINGS: Patient rotated rightward. Sternotomy wires overlie normal cardiac
silhouette. There is bibasilar pleural effusion which are new from
prior. Low lung volumes. Mild central venous congestion.
IMPRESSION: Increased central venous congestion and bibasilar effusions.

## 2017-07-02 IMAGING — CR DG CHEST 2V
1 series · 2 of 2 positions shown · non-contrast
Comparison: 05/17/2015 and previous

CLINICAL DATA: Pt here from home via [facility] pt states she felt like
her bp was getting high so she took her own BP 200/99, pt states she
was also having chest pressure feeling like something was sitting on
her chest as well as head pressure. Pt ...*comment was truncated*

EXAM:
CHEST - 2 VIEW, increased since previous exam.

[Series 1: dg chest 2 view · 0.14mm/px · 2 of 2 slices shown]
[im 1/2]
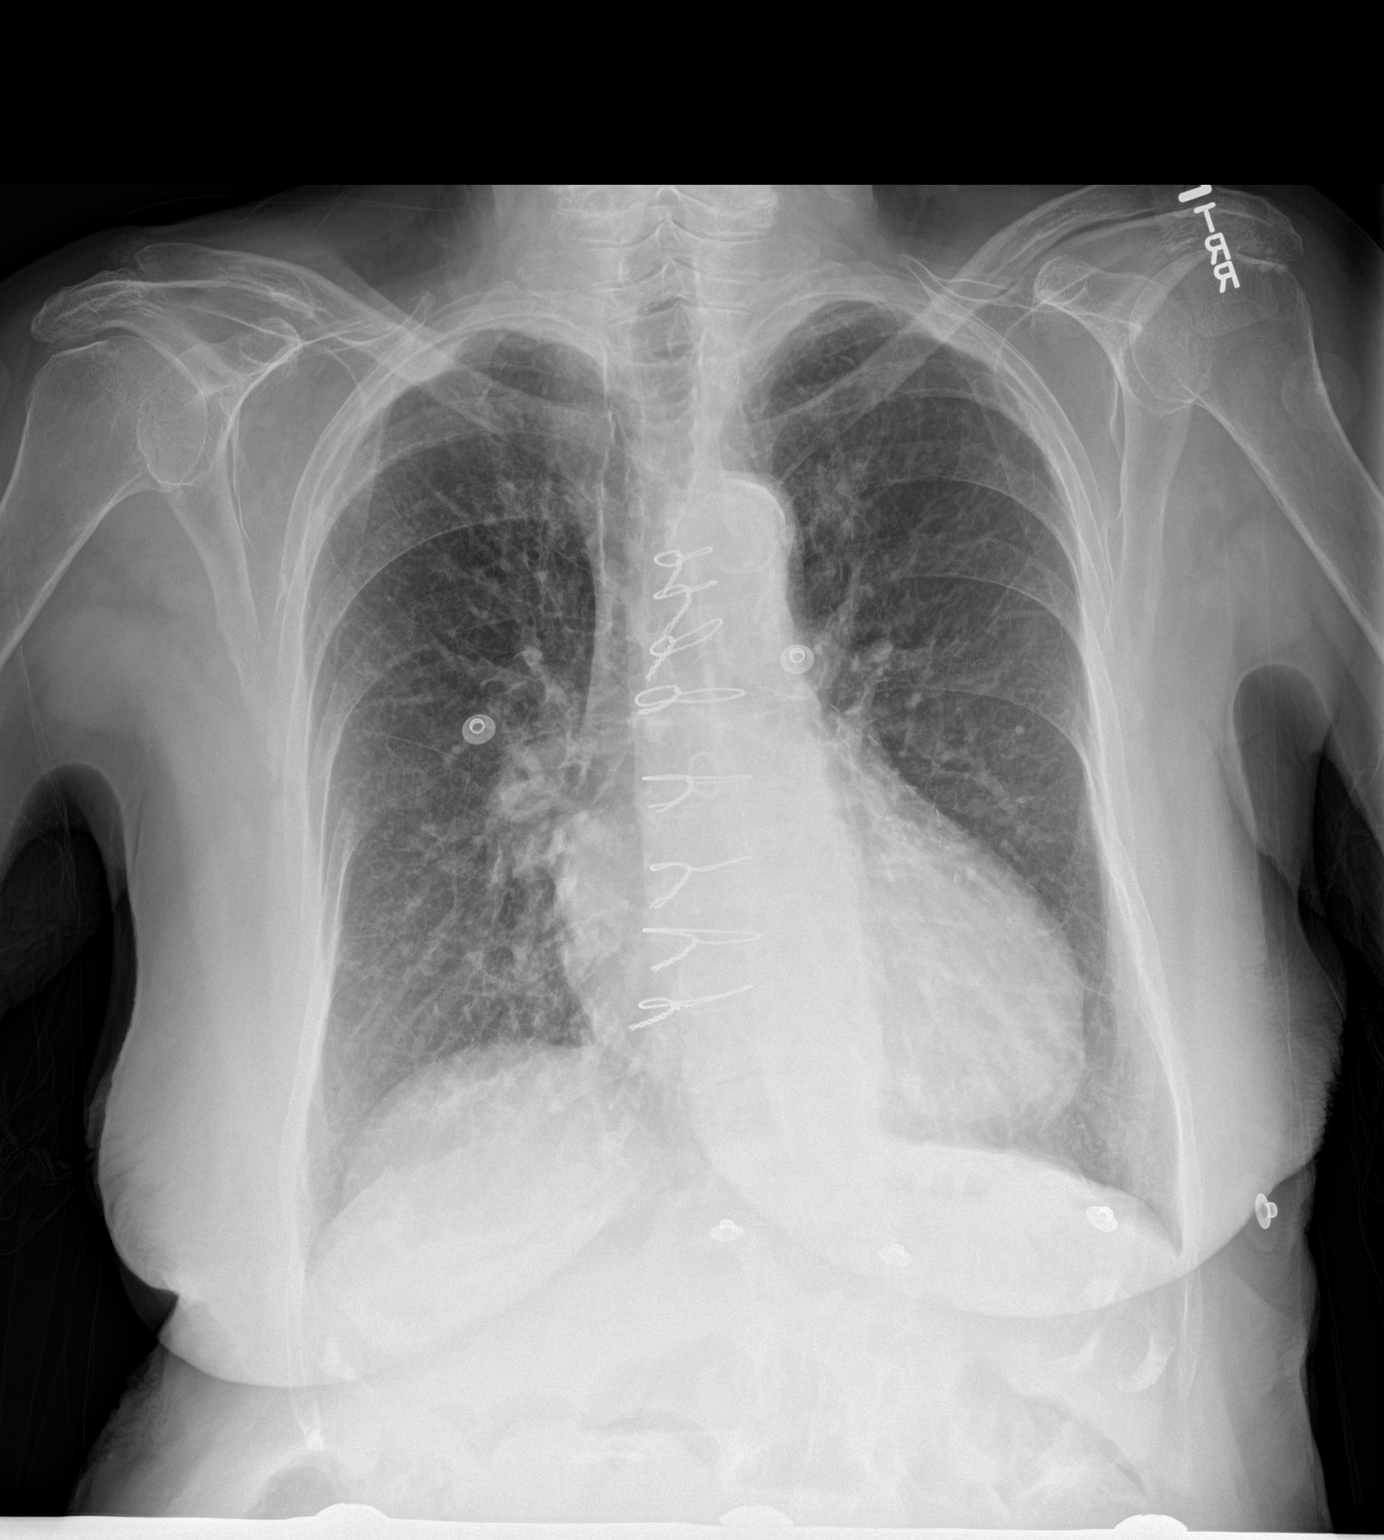
[im 2/2]
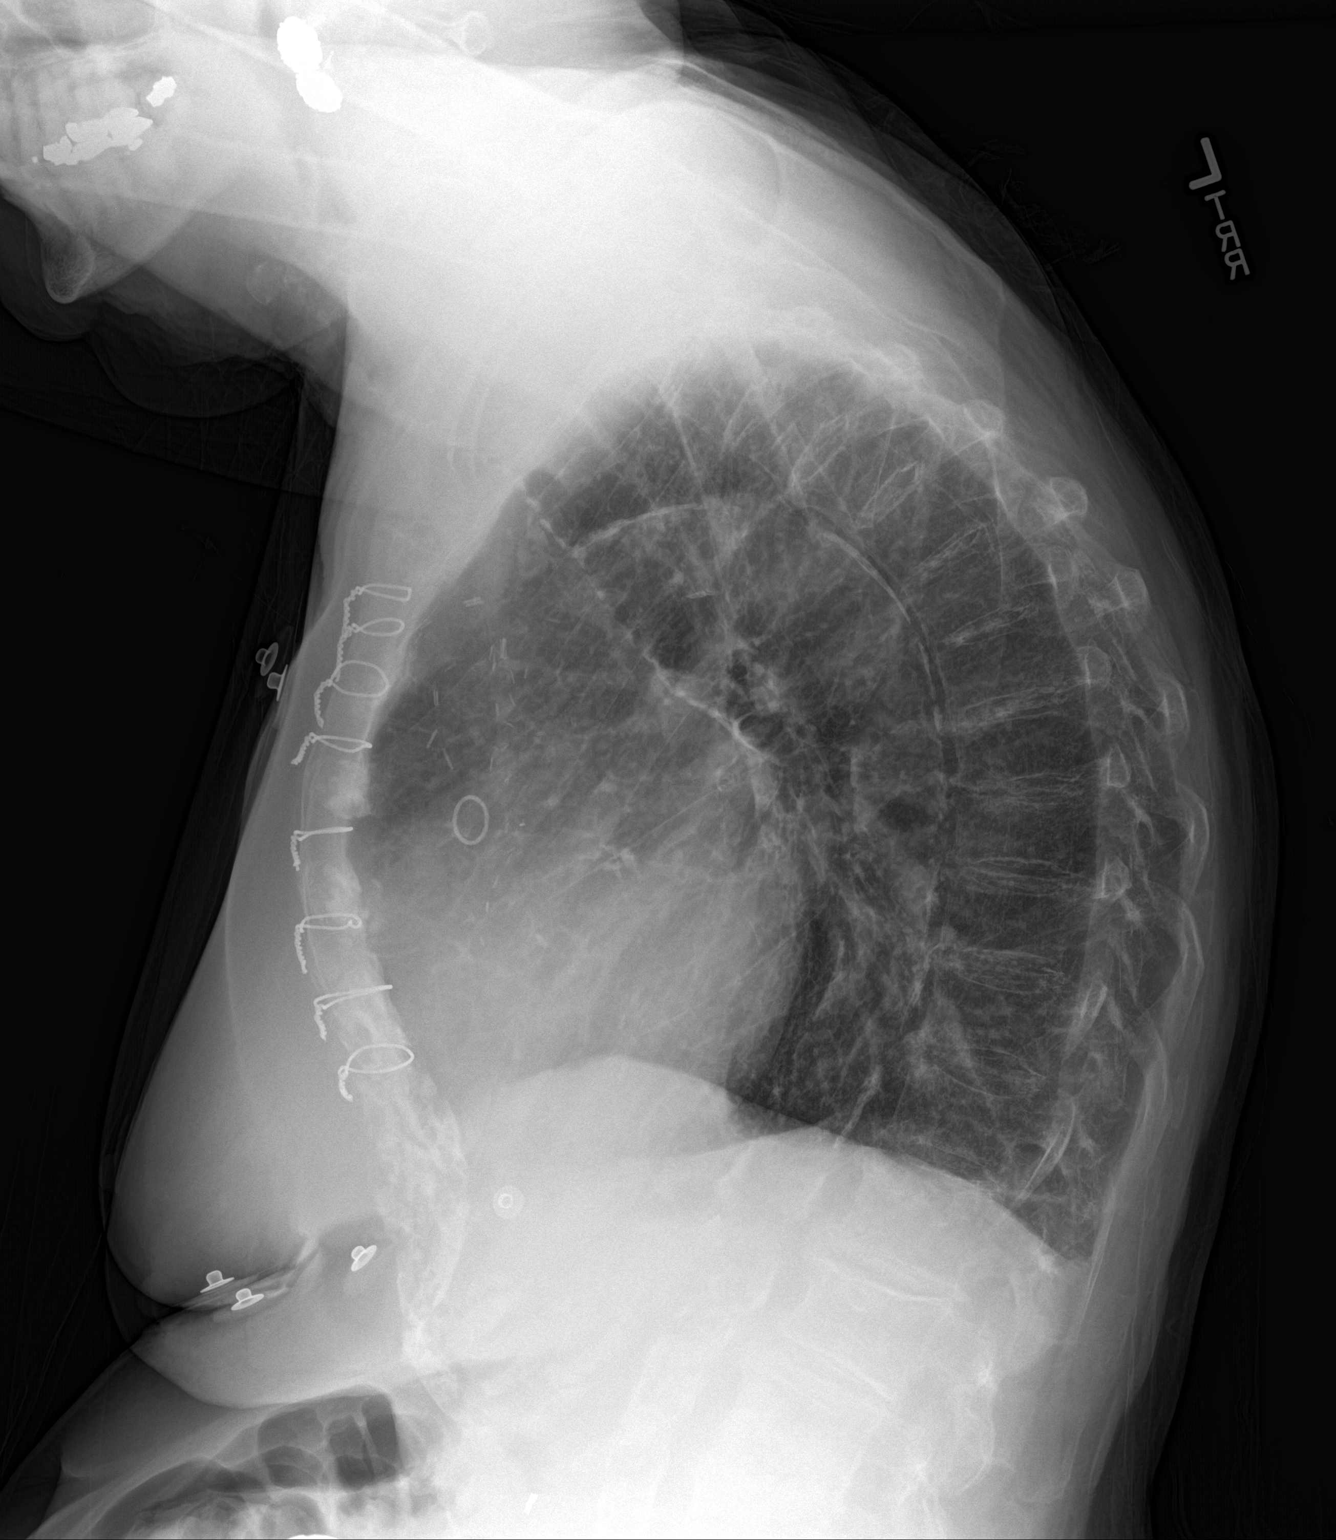

[2 of 2 positions shown; findings below may reference images not displayed]

FINDINGS: Previous CABG. Mild cardiomegaly. Pulmonary hyperinflation with
coarse bibasilar interstitial opacities, no confluent airspace
disease or overt edema. Atheromatous aorta.
No effusion.  No pneumothorax.
Stable mild compression deformity at the thoracolumbar junction.
IMPRESSION: 1. Cardiomegaly and chronic bibasilar interstitial changes. No acute
disease.

## 2017-10-01 ENCOUNTER — Ambulatory Visit (INDEPENDENT_AMBULATORY_CARE_PROVIDER_SITE_OTHER): Payer: Medicare Other | Admitting: Podiatry

## 2017-10-01 ENCOUNTER — Encounter: Payer: Self-pay | Admitting: Podiatry

## 2017-10-01 DIAGNOSIS — M79674 Pain in right toe(s): Secondary | ICD-10-CM

## 2017-10-01 DIAGNOSIS — B351 Tinea unguium: Secondary | ICD-10-CM

## 2017-10-01 DIAGNOSIS — M79675 Pain in left toe(s): Secondary | ICD-10-CM

## 2017-10-01 DIAGNOSIS — M2041 Other hammer toe(s) (acquired), right foot: Secondary | ICD-10-CM

## 2017-10-01 DIAGNOSIS — D689 Coagulation defect, unspecified: Secondary | ICD-10-CM

## 2017-10-01 DIAGNOSIS — M2042 Other hammer toe(s) (acquired), left foot: Secondary | ICD-10-CM

## 2017-10-01 NOTE — Progress Notes (Signed)
This patient presents the office with chief complaint of painful ingrowing toenails both big toes.she says the nails are painful walking and wearing her shoes.  She has not been seen in over a year.  She previously had nail surgery due to ingrowing toenail by Dr. Logan Bores. She presents the office today for continued evaluation and treatment of her toenails both feet.  General Appearance  Alert, conversant and in no acute stress.  Vascular  Dorsalis pedis and posterior tibial  pulses are weakly  palpable  bilaterally.  Capillary return is within normal limits  bilaterally. Cold feet noted.  bilaterally.  Neurologic  Senn-Weinstein monofilament wire test within normal limits  bilaterally. Muscle power within normal limits bilaterally.  Nails Thick disfigured discolored nails with subungual debris  from hallux to fifth toes bilaterally. No evidence of bacterial infection or drainage bilaterally. Red inflamed skin distal aspect left hallux.  Orthopedic  No limitations of motion of motion feet .  No crepitus or effusions noted.  Hammer toes 2-5  B/L  Skin  normotropic skin with no porokeratosis noted bilaterally.  No signs of infections or ulcers noted.   Onychomycosis  B/L  Hammer toes  B/L  Debridement of nails  B/L  RTC prn   Helane Gunther DPM

## 2017-12-25 ENCOUNTER — Emergency Department
Admission: EM | Admit: 2017-12-25 | Discharge: 2017-12-25 | Disposition: A | Discharge status: Home / Self Care | Payer: Medicare Other | Attending: Emergency Medicine | Admitting: Emergency Medicine

## 2017-12-25 ENCOUNTER — Emergency Department: Payer: Medicare Other

## 2017-12-25 ENCOUNTER — Other Ambulatory Visit: Payer: Self-pay

## 2017-12-25 DIAGNOSIS — Z7982 Long term (current) use of aspirin: Secondary | ICD-10-CM | POA: Diagnosis not present

## 2017-12-25 DIAGNOSIS — M7532 Calcific tendinitis of left shoulder: Secondary | ICD-10-CM | POA: Diagnosis not present

## 2017-12-25 DIAGNOSIS — Z7902 Long term (current) use of antithrombotics/antiplatelets: Secondary | ICD-10-CM | POA: Diagnosis not present

## 2017-12-25 DIAGNOSIS — R079 Chest pain, unspecified: Secondary | ICD-10-CM | POA: Diagnosis not present

## 2017-12-25 DIAGNOSIS — I509 Heart failure, unspecified: Secondary | ICD-10-CM | POA: Insufficient documentation

## 2017-12-25 DIAGNOSIS — I110 Hypertensive heart disease with heart failure: Secondary | ICD-10-CM | POA: Diagnosis not present

## 2017-12-25 DIAGNOSIS — Z79899 Other long term (current) drug therapy: Secondary | ICD-10-CM | POA: Diagnosis not present

## 2017-12-25 LAB — BASIC METABOLIC PANEL
Anion gap: 9 (ref 5–15)
BUN: 27 mg/dL — AB (ref 6–20)
CHLORIDE: 100 mmol/L — AB (ref 101–111)
CO2: 28 mmol/L (ref 22–32)
CREATININE: 1.17 mg/dL — AB (ref 0.44–1.00)
Calcium: 9 mg/dL (ref 8.9–10.3)
GFR, EST AFRICAN AMERICAN: 44 mL/min — AB (ref >60)
GFR, EST NON AFRICAN AMERICAN: 38 mL/min — AB (ref >60)
Glucose, Bld: 109 mg/dL — ABNORMAL HIGH (ref 65–99)
POTASSIUM: 3.9 mmol/L (ref 3.5–5.1)
Sodium: 137 mmol/L (ref 135–145)

## 2017-12-25 LAB — CBC
HEMATOCRIT: 35.2 % (ref 35.0–47.0)
Hemoglobin: 11.6 g/dL — ABNORMAL LOW (ref 12.0–16.0)
MCH: 26.5 pg (ref 26.0–34.0)
MCHC: 33 g/dL (ref 32.0–36.0)
MCV: 80.2 fL (ref 80.0–100.0)
PLATELETS: 300 10*3/uL (ref 150–440)
RBC: 4.39 MIL/uL (ref 3.80–5.20)
RDW: 13.9 % (ref 11.5–14.5)
WBC: 12.5 10*3/uL — AB (ref 3.6–11.0)

## 2017-12-25 LAB — TROPONIN I: Troponin I: <0.03 ng/mL (ref <0.03)

## 2017-12-25 MED ORDER — BUPIVACAINE HCL 0.5 % IJ SOLN
30.0000 mL | Freq: Once | INTRAMUSCULAR | Status: AC
  Administered 2017-12-25: 30 mL

## 2017-12-25 MED ORDER — BUPIVACAINE HCL (PF) 0.5 % IJ SOLN
INTRAMUSCULAR | Status: AC
  Filled 2017-12-25: qty 30

## 2017-12-25 MED ORDER — IBUPROFEN 400 MG PO TABS
400.0000 mg | ORAL_TABLET | Freq: Once | ORAL | Status: AC
  Administered 2017-12-25: 400 mg via ORAL
  Filled 2017-12-25: qty 1

## 2017-12-25 NOTE — ED Provider Notes (Signed)
[facility] Emergency Department Provider Note  ____________________________________________   First MD Initiated Contact with Patient 12/25/17 1332     (approximate)  I have reviewed the triage vital signs and the nursing notes.   HISTORY  Chief Complaint Chest Pain    HPI Anita Ward is a 84 y.o. female who comes to the emergency department with 2 issues.  First she reports throbbing aching moderate severity left shoulder pain that began this morning when she awoke on her left side.  Pain is worse when ranging her shoulder improved with rest.  No numbness or weakness.  She also reports intermittent substernal chest pain.  Mild shortness of breath.  She has a long-standing history of coronary artery disease and congestive heart failure.  She denies leg swelling.  She sleeps on one pillow and does not wake at night short of breath.  Her chest pain is mild nonradiating.  Nothing seems to make it better or worse.  Past Medical History:  Diagnosis Date  . Anxiety   . Congestive heart failure (CHF) (HCC)   . Coronary artery disease   . Glaucoma   . HTN (hypertension)   . Osteoporosis     Patient Active Problem List   Diagnosis Date Noted  . Chest pain 02/19/2016  . Hyponatremia 02/19/2016  . MI (myocardial infarction) (HCC) 05/27/2015    Past Surgical History:  Procedure Laterality Date  . CARDIAC SURGERY    . CARDIAC SURGERY    . CORONARY ARTERY BYPASS GRAFT      Prior to Admission medications   Medication Sig Start Date End Date Taking? Authorizing Provider  aspirin EC 81 MG EC tablet Take 1 tablet (81 mg total) by mouth daily. Patient taking differently: Take 81 mg by mouth every morning.  05/29/15   Altamese Dilling, MD  Calcium Carbonate-Vitamin D (CALCIUM 600+D) 600-400 MG-UNIT tablet Take 1 tablet by mouth 2 (two) times daily.    [provider]  cholecalciferol (VITAMIN D) 1000 UNITS tablet Take 1,000 Units by mouth  every morning.     [provider]  cloNIDine (CATAPRES) 0.3 MG tablet Take 1 tablet (0.3 mg total) by mouth 2 (two) times daily. 02/22/16   Houston Siren, MD  clopidogrel (PLAVIX) 75 MG tablet Take 1 tablet (75 mg total) by mouth daily with breakfast. 05/29/15   Altamese Dilling, MD  clotrimazole-betamethasone (LOTRISONE) cream Apply 1 application topically 2 (two) times daily as needed.     [provider]  CRANBERRY PO Take 1 capsule by mouth every morning.     [provider]  famotidine (PEPCID) 20 MG tablet Take 1 tablet (20 mg total) by mouth 2 (two) times daily. 03/22/16   Enid Baas, MD  furosemide (LASIX) 40 MG tablet Take 1 tablet (40 mg total) by mouth at bedtime. 02/03/16 02/02/17  Myrna Blazer, MD  hydrALAZINE (APRESOLINE) 50 MG tablet Take 1 tablet (50 mg total) by mouth every 6 (six) hours. 03/22/16   Enid Baas, MD  isosorbide mononitrate (IMDUR) 30 MG 24 hr tablet Take 30 mg by mouth 2 (two) times daily.    [provider]  levothyroxine (SYNTHROID, LEVOTHROID) 25 MCG tablet Take 25 mcg by mouth every morning.     [provider]  losartan (COZAAR) 100 MG tablet Take 1 tablet (100 mg total) by mouth daily. Patient taking differently: Take 100 mg by mouth every morning.  02/22/16   Houston Siren, MD  Magnesium 200 MG  TABS Take 200 mg by mouth 2 (two) times daily.    [provider]  metoprolol tartrate (LOPRESSOR) 25 MG tablet Take 1 tablet (25 mg total) by mouth 2 (two) times daily. 03/22/16   Enid Baas, MD  mupirocin cream (BACTROBAN) 2 % Apply 1 application topically 2 (two) times daily. 05/12/16   Felecia Shelling, DPM  nitroGLYCERIN (NITROSTAT) 0.4 MG SL tablet Place 0.4 mg under the tongue every 5 (five) minutes x 3 doses as needed for chest pain.     [provider]  pantoprazole (PROTONIX) 40 MG tablet Take 1 tablet (40 mg total) by mouth 2 (two) times daily. 03/22/16    Enid Baas, MD  Potassium Chloride ER 20 MEQ TBCR Take 20 mEq by mouth every morning.     [provider]  simvastatin (ZOCOR) 10 MG tablet Take 1 tablet (10 mg total) by mouth daily at 6 PM. 05/29/15   Altamese Dilling, MD  traMADol (ULTRAM) 50 MG tablet Take 50 mg by mouth 3 (three) times daily as needed for moderate pain.    [provider]  vitamin B-12 (CYANOCOBALAMIN) 1000 MCG tablet Take 1,000 mcg by mouth every morning.     [provider]    Allergies Benicar [olmesartan]; Cardura [doxazosin mesylate]; Ciprofloxacin; Fosamax [alendronate sodium]; Lisinopril; Norvasc [amlodipine besylate]; Penicillins; Sulfa antibiotics; and Torsemide  Family History  Problem Relation Age of Onset  . Congestive Heart Failure Father   . CAD Brother     Social History Social History   Tobacco Use  . Smoking status: Never Smoker  . Smokeless tobacco: Never Used  Substance Use Topics  . Alcohol use: No  . Drug use: No    Review of Systems Constitutional: No fever/chills Eyes: No visual changes. ENT: No sore throat. Cardiovascular: Positive for chest pain. Respiratory: Positive for shortness of breath. Gastrointestinal: No abdominal pain.  No nausea, no vomiting.  No diarrhea.  No constipation. Genitourinary: Negative for dysuria. Musculoskeletal: Negative for back pain. Skin: Negative for rash. Neurological: Negative for headaches, focal weakness or numbness.   ____________________________________________   PHYSICAL EXAM:  VITAL SIGNS: ED Triage Vitals  Enc Vitals Group     BP 12/25/17 1315 (!) 176/60     Pulse Rate 12/25/17 1315 69     Resp 12/25/17 1315 16     Temp 12/25/17 1315 98.2 F (36.8 C)     Temp Source 12/25/17 1315 Oral     SpO2 12/25/17 1315 96 %     Weight 12/25/17 1223 130 lb (59 kg)     Height 12/25/17 1223 5\' 1"  (1.549 m)     Head Circumference --      Peak Flow --      Pain Score 12/25/17 1222 8     Pain Loc --       Pain Edu? --      Excl. in GC? --     Constitutional: Alert and oriented x4 pleasant cooperative chronically ill-appearing but no acute distress Eyes: PERRL EOMI. Head: Atraumatic. Nose: No congestion/rhinnorhea. Mouth/Throat: No trismus Neck: No stridor.   Cardiovascular: Normal rate, regular rhythm. Grossly normal heart sounds.  Good peripheral circulation. Respiratory: Normal respiratory effort.  No retractions. Lungs CTAB and moving good air Gastrointestinal: Soft nontender Musculoskeletal: Discomfort when ranging left shoulder neurovascularly intact Neurologic:  Normal speech and language. No gross focal neurologic deficits are appreciated. Skin:  Skin is warm, dry and intact. No rash noted. Psychiatric: Mood and affect are normal. Speech  and behavior are normal.    ____________________________________________   DIFFERENTIAL includes but not limited to  Septic joint, bursitis, shoulder dislocation, shoulder fracture, acute coronary syndrome, pulmonary embolism ____________________________________________   LABS (all labs ordered are listed, but only abnormal results are displayed)  Labs Reviewed  BASIC METABOLIC PANEL - Abnormal; Notable for the following components:      Result Value   Chloride 100 (*)    Glucose, Bld 109 (*)    BUN 27 (*)    Creatinine, Ser 1.17 (*)    GFR calc non Af Amer 38 (*)    GFR calc Af Amer 44 (*)    All other components within normal limits  CBC - Abnormal; Notable for the following components:   WBC 12.5 (*)    Hemoglobin 11.6 (*)    All other components within normal limits  TROPONIN I  TROPONIN I    Lab work reviewed by me with no signs of acute ischemia x2 __________________________________________  EKG  ED ECG REPORT I, Merrily Brittle, the attending physician, personally viewed and interpreted this ECG.  Date: 12/25/2017 EKG Time:  Rate: 73 Rhythm: normal sinus rhythm QRS Axis: Leftward Intervals: First-degree AV  block ST/T Wave abnormalities: Left bundle branch block with appropriate repolarization abnormalities Narrative Interpretation: no evidence of acute ischemia  ____________________________________________  RADIOLOGY  Chest x-ray reviewed by me with mild edema Left shoulder x-ray reviewed by me consistent with calcific tendinitis ____________________________________________   PROCEDURES  Procedure(s) performed: yes  .Joint Aspiration/Arthrocentesis Date/Time: 12/25/2017 2:46 PM Performed by: Merrily Brittle, MD Authorized by: Merrily Brittle, MD   Consent:    Consent obtained:  Verbal   Consent given by:  Patient   Risks discussed:  Bleeding, infection, nerve damage and pain   Alternatives discussed:  No treatment and alternative treatment Location:    Location:  Shoulder   Shoulder:  L glenohumeral Anesthesia (see MAR for exact dosages):    Anesthesia method:  None Procedure details:    Needle gauge:  20 G   Ultrasound guidance: yes     Approach:  Posterior   Aspirate amount:  0   Steroid injected: no     Specimen collected: no   Post-procedure details:    Dressing:  Adhesive bandage   Patient tolerance of procedure:  Tolerated well, no immediate complications Comments:     Using live ultrasound guidance I performed a left shoulder arthrocentesis and injection using a total of 9 cc of 0.5% bupivacaine without epinephrine.  In real-time I visualized by 20-gauge spinal needle entered the joint aspirated a small amount of clear fluid and the anesthetic was infused without difficulty.    Critical Care performed: no  Observation: no ____________________________________________   INITIAL IMPRESSION / ASSESSMENT AND PLAN / ED COURSE  Pertinent labs & imaging results that were available during my care of the patient were reviewed by me and considered in my medical decision making (see chart for details).       ----------------------------------------- 1:49 PM on  12/25/2017 -----------------------------------------  I discussed the case with the patient's cardiologist Dr. Gwen Pounds who agrees with a second troponin and outpatient management in clinic tomorrow.  Regarding the patient's shoulder pain her x-ray is consistent with calcific tendinitis.  I verbally consented her for arthrocentesis and intra-articular bupivacaine injection.  Following injection of intra-articular bupivacaine the patient's symptoms have improved.  Her second troponin is negative and at this point she is medically stable for outpatient management.  She verbalizes understanding and agreement  the plan ____________________________________________   FINAL CLINICAL IMPRESSION(S) / ED DIAGNOSES  Final diagnoses:  Calcific tendinitis of left shoulder  Chest pain, unspecified type      NEW MEDICATIONS STARTED DURING THIS VISIT:  Discharge Medication List as of 12/25/2017  4:32 PM       Note:  This document was prepared using Dragon voice recognition software and may include unintentional dictation errors.     Merrily Brittle, MD 12/27/17 2342

## 2017-12-25 NOTE — Discharge Instructions (Signed)
Please follow-up with your cardiologist tomorrow for recheck and return to the emergency department sooner for any concerns.  It was a pleasure to take care of you today, and thank you for coming to our emergency department.  If you have any questions or concerns before leaving please ask the nurse to grab me and I'm more than happy to go through your aftercare instructions again.  If you were prescribed any opioid pain medication today such as Norco, Vicodin, Percocet, morphine, hydrocodone, or oxycodone please make sure you do not drive when you are taking this medication as it can alter your ability to drive safely.  If you have any concerns once you are home that you are not improving or are in fact getting worse before you can make it to your follow-up appointment, please do not hesitate to call 911 and come back for further evaluation.  Merrily Brittle, MD  Results for orders placed or performed during the hospital encounter of 12/25/17  Basic metabolic panel  Result Value Ref Range   Sodium 137 135 - 145 mmol/L   Potassium 3.9 3.5 - 5.1 mmol/L   Chloride 100 (L) 101 - 111 mmol/L   CO2 28 22 - 32 mmol/L   Glucose, Bld 109 (H) 65 - 99 mg/dL   BUN 27 (H) 6 - 20 mg/dL   Creatinine, Ser 1.61 (H) 0.44 - 1.00 mg/dL   Calcium 9.0 8.9 - 09.6 mg/dL   GFR calc non Af Amer 38 (L) >60 mL/min   GFR calc Af Amer 44 (L) >60 mL/min   Anion gap 9 5 - 15  CBC  Result Value Ref Range   WBC 12.5 (H) 3.6 - 11.0 K/uL   RBC 4.39 3.80 - 5.20 MIL/uL   Hemoglobin 11.6 (L) 12.0 - 16.0 g/dL   HCT 04.5 40.9 - 81.1 %   MCV 80.2 80.0 - 100.0 fL   MCH 26.5 26.0 - 34.0 pg   MCHC 33.0 32.0 - 36.0 g/dL   RDW 91.4 78.2 - 95.6 %   Platelets 300 150 - 440 K/uL  Troponin I  Result Value Ref Range   Troponin I <0.03 <0.03 ng/mL  Troponin I  Result Value Ref Range   Troponin I <0.03 <0.03 ng/mL   Dg Chest 2 View  Result Date: 12/25/2017 CLINICAL DATA:  Chest pain. EXAM: CHEST - 2 VIEW COMPARISON:  CT  03/20/2016.  Chest x-ray 03/19/2016. FINDINGS: Prior CABG. Cardiomegaly with bilateral pulmonary interstitial prominence and bibasilar infiltrates suggesting edema. No prominent pleural effusion no pneumothorax. These findings are consistent with CHF. Biapical pleural thickening consistent scarring. IMPRESSION: Prior CABG. Congestive heart failure with pulmonary interstitial and alveolar edema. Electronically Signed   By: Maisie Fus  Register   On: 12/25/2017 13:08   Dg Elbow Complete Left  Result Date: 12/25/2017 CLINICAL DATA:  Initial evaluation for acute chest pain with left arm pain. EXAM: LEFT ELBOW - COMPLETE 3+ VIEW COMPARISON:  None. FINDINGS: No acute fracture or dislocation. No joint effusion. Radial head intact. Osseous mineralization within normal limits. No acute soft tissue abnormality. Vascular calcifications noted within the proximal forearm. IMPRESSION: No acute osseous abnormality about the left elbow. Electronically Signed   By: Rise Mu M.D.   On: 12/25/2017 14:31   Dg Shoulder Left  Result Date: 12/25/2017 CLINICAL DATA:  Initial evaluation for acute chest pain, left arm pain. EXAM: LEFT SHOULDER - 2+ VIEW COMPARISON:  None. FINDINGS: No acute fracture or dislocation. Humeral head in normal line with the  glenoid. AC joint approximated. Peritoneal calcification at the superolateral left humeral head consistent with calcific tendinopathy. Osteoarthritic changes present about the [facility] joint. No acute soft tissue abnormality. IMPRESSION: 1. No acute osseous abnormality. 2. Heterotopic calcification at the superolateral aspect of the left humeral head, consistent with underlying calcific tendinopathy. 3. Osteoarthritic changes at the left acromioclavicular joint. Electronically Signed   By: Rise Mu M.D.   On: 12/25/2017 14:30

## 2017-12-25 NOTE — ED Triage Notes (Signed)
Pt states CP began last night, took nitro (last dose she had). When she woke up her L arm was hurting. States CP at L breast. States "a little bit of SOB but not real bad."   Alert, oriented, in wheelchair. No distress noted at this time.

## 2019-01-13 ENCOUNTER — Emergency Department: Payer: Medicare Other

## 2019-01-13 ENCOUNTER — Other Ambulatory Visit: Payer: Self-pay

## 2019-01-13 ENCOUNTER — Inpatient Hospital Stay
Admission: EM | Admit: 2019-01-13 | Discharge: 2019-01-15 | DRG: 282 | Disposition: A | Discharge status: Home Health | Payer: Medicare Other | Attending: Internal Medicine | Admitting: Internal Medicine

## 2019-01-13 ENCOUNTER — Encounter: Payer: Self-pay | Admitting: Emergency Medicine

## 2019-01-13 DIAGNOSIS — I440 Atrioventricular block, first degree: Secondary | ICD-10-CM | POA: Diagnosis present

## 2019-01-13 DIAGNOSIS — Z880 Allergy status to penicillin: Secondary | ICD-10-CM

## 2019-01-13 DIAGNOSIS — M810 Age-related osteoporosis without current pathological fracture: Secondary | ICD-10-CM | POA: Diagnosis present

## 2019-01-13 DIAGNOSIS — E782 Mixed hyperlipidemia: Secondary | ICD-10-CM | POA: Diagnosis present

## 2019-01-13 DIAGNOSIS — Z1159 Encounter for screening for other viral diseases: Secondary | ICD-10-CM

## 2019-01-13 DIAGNOSIS — I214 Non-ST elevation (NSTEMI) myocardial infarction: Principal | ICD-10-CM

## 2019-01-13 DIAGNOSIS — E785 Hyperlipidemia, unspecified: Secondary | ICD-10-CM | POA: Diagnosis present

## 2019-01-13 DIAGNOSIS — Z882 Allergy status to sulfonamides status: Secondary | ICD-10-CM

## 2019-01-13 DIAGNOSIS — I509 Heart failure, unspecified: Secondary | ICD-10-CM | POA: Diagnosis present

## 2019-01-13 DIAGNOSIS — I340 Nonrheumatic mitral (valve) insufficiency: Secondary | ICD-10-CM | POA: Diagnosis present

## 2019-01-13 DIAGNOSIS — H409 Unspecified glaucoma: Secondary | ICD-10-CM | POA: Diagnosis present

## 2019-01-13 DIAGNOSIS — R079 Chest pain, unspecified: Secondary | ICD-10-CM | POA: Diagnosis not present

## 2019-01-13 DIAGNOSIS — E039 Hypothyroidism, unspecified: Secondary | ICD-10-CM | POA: Diagnosis present

## 2019-01-13 DIAGNOSIS — I447 Left bundle-branch block, unspecified: Secondary | ICD-10-CM | POA: Diagnosis present

## 2019-01-13 DIAGNOSIS — I110 Hypertensive heart disease with heart failure: Secondary | ICD-10-CM | POA: Diagnosis present

## 2019-01-13 DIAGNOSIS — Z888 Allergy status to other drugs, medicaments and biological substances status: Secondary | ICD-10-CM

## 2019-01-13 DIAGNOSIS — Z881 Allergy status to other antibiotic agents status: Secondary | ICD-10-CM

## 2019-01-13 DIAGNOSIS — Z7902 Long term (current) use of antithrombotics/antiplatelets: Secondary | ICD-10-CM

## 2019-01-13 DIAGNOSIS — Z79890 Hormone replacement therapy (postmenopausal): Secondary | ICD-10-CM

## 2019-01-13 DIAGNOSIS — Z8249 Family history of ischemic heart disease and other diseases of the circulatory system: Secondary | ICD-10-CM

## 2019-01-13 DIAGNOSIS — Z79899 Other long term (current) drug therapy: Secondary | ICD-10-CM

## 2019-01-13 DIAGNOSIS — I2510 Atherosclerotic heart disease of native coronary artery without angina pectoris: Secondary | ICD-10-CM | POA: Diagnosis present

## 2019-01-13 DIAGNOSIS — Z951 Presence of aortocoronary bypass graft: Secondary | ICD-10-CM

## 2019-01-13 DIAGNOSIS — K219 Gastro-esophageal reflux disease without esophagitis: Secondary | ICD-10-CM | POA: Diagnosis present

## 2019-01-13 DIAGNOSIS — I160 Hypertensive urgency: Secondary | ICD-10-CM

## 2019-01-13 DIAGNOSIS — Z7982 Long term (current) use of aspirin: Secondary | ICD-10-CM

## 2019-01-13 LAB — LIPID PANEL
Cholesterol: 108 mg/dL (ref 0–200)
Cholesterol: 120 mg/dL (ref 0–200)
HDL: 40 mg/dL — ABNORMAL LOW (ref >40)
HDL: 44 mg/dL (ref >40)
LDL Cholesterol: 52 mg/dL (ref 0–99)
LDL Cholesterol: 59 mg/dL (ref 0–99)
Total CHOL/HDL Ratio: 2.7 RATIO
Total CHOL/HDL Ratio: 2.7 RATIO
Triglycerides: 80 mg/dL (ref <150)
Triglycerides: 87 mg/dL (ref <150)
VLDL: 16 mg/dL (ref 0–40)
VLDL: 17 mg/dL (ref 0–40)

## 2019-01-13 LAB — BASIC METABOLIC PANEL
Anion gap: 9 (ref 5–15)
BUN: 29 mg/dL — ABNORMAL HIGH (ref 8–23)
CO2: 30 mmol/L (ref 22–32)
Calcium: 9.2 mg/dL (ref 8.9–10.3)
Chloride: 101 mmol/L (ref 98–111)
Creatinine, Ser: 1.24 mg/dL — ABNORMAL HIGH (ref 0.44–1.00)
GFR calc Af Amer: 42 mL/min — ABNORMAL LOW (ref >60)
GFR calc non Af Amer: 36 mL/min — ABNORMAL LOW (ref >60)
Glucose, Bld: 97 mg/dL (ref 70–99)
Potassium: 3.5 mmol/L (ref 3.5–5.1)
Sodium: 140 mmol/L (ref 135–145)

## 2019-01-13 LAB — CBC
HCT: 43.5 % (ref 36.0–46.0)
Hemoglobin: 13.8 g/dL (ref 12.0–15.0)
MCH: 26.4 pg (ref 26.0–34.0)
MCHC: 31.7 g/dL (ref 30.0–36.0)
MCV: 83.3 fL (ref 80.0–100.0)
Platelets: 320 10*3/uL (ref 150–400)
RBC: 5.22 MIL/uL — ABNORMAL HIGH (ref 3.87–5.11)
RDW: 13.7 % (ref 11.5–15.5)
WBC: 12.8 10*3/uL — ABNORMAL HIGH (ref 4.0–10.5)
nRBC: 0 % (ref 0.0–0.2)

## 2019-01-13 LAB — PROTIME-INR
INR: 1 (ref 0.8–1.2)
Prothrombin Time: 13.1 seconds (ref 11.4–15.2)

## 2019-01-13 LAB — GLUCOSE, CAPILLARY: Glucose-Capillary: 96 mg/dL (ref 70–99)

## 2019-01-13 LAB — TSH: TSH: 1.902 u[IU]/mL (ref 0.350–4.500)

## 2019-01-13 LAB — SARS CORONAVIRUS 2 BY RT PCR (HOSPITAL ORDER, PERFORMED IN ~~LOC~~ HOSPITAL LAB): SARS Coronavirus 2: NEGATIVE

## 2019-01-13 LAB — TROPONIN I (HIGH SENSITIVITY): Troponin I (High Sensitivity): 19 ng/L — ABNORMAL HIGH (ref <18)

## 2019-01-13 LAB — APTT: aPTT: 30 seconds (ref 24–36)

## 2019-01-13 MED ORDER — VITAMIN B-12 1000 MCG PO TABS
1000.0000 ug | ORAL_TABLET | Freq: Every morning | ORAL | Status: DC
  Administered 2019-01-14 – 2019-01-15 (×2): 1000 ug via ORAL
  Filled 2019-01-13 (×2): qty 1

## 2019-01-13 MED ORDER — ACETAMINOPHEN 325 MG PO TABS
650.0000 mg | ORAL_TABLET | ORAL | Status: DC | PRN
  Administered 2019-01-13: 650 mg via ORAL
  Filled 2019-01-13: qty 2

## 2019-01-13 MED ORDER — LEVOTHYROXINE SODIUM 25 MCG PO TABS
25.0000 ug | ORAL_TABLET | ORAL | Status: DC
  Administered 2019-01-14 – 2019-01-15 (×2): 25 ug via ORAL
  Filled 2019-01-13 (×2): qty 1

## 2019-01-13 MED ORDER — METOPROLOL TARTRATE 25 MG PO TABS
25.0000 mg | ORAL_TABLET | Freq: Two times a day (BID) | ORAL | Status: DC
  Administered 2019-01-13 – 2019-01-15 (×4): 25 mg via ORAL
  Filled 2019-01-13 (×4): qty 1

## 2019-01-13 MED ORDER — ASPIRIN EC 81 MG PO TBEC
81.0000 mg | DELAYED_RELEASE_TABLET | ORAL | Status: DC
  Administered 2019-01-14 – 2019-01-15 (×2): 81 mg via ORAL
  Filled 2019-01-13 (×3): qty 1

## 2019-01-13 MED ORDER — ENSURE ENLIVE PO LIQD: 237.0000 mL | ORAL | Status: AC

## 2019-01-13 MED ORDER — IOHEXOL 350 MG/ML SOLN
60.0000 mL | Freq: Once | INTRAVENOUS | Status: AC | PRN
  Administered 2019-01-13: 60 mL via INTRAVENOUS

## 2019-01-13 MED ORDER — POTASSIUM CHLORIDE 20 MEQ PO PACK
20.0000 meq | PACK | Freq: Every day | ORAL | Status: DC
  Administered 2019-01-14 – 2019-01-15 (×2): 20 meq via ORAL
  Filled 2019-01-13 (×2): qty 1

## 2019-01-13 MED ORDER — FAMOTIDINE 20 MG PO TABS
20.0000 mg | ORAL_TABLET | Freq: Every day | ORAL | Status: DC
  Administered 2019-01-14: 20 mg via ORAL
  Filled 2019-01-13: qty 1

## 2019-01-13 MED ORDER — CLOPIDOGREL BISULFATE 75 MG PO TABS
75.0000 mg | ORAL_TABLET | Freq: Every day | ORAL | Status: DC
  Administered 2019-01-14 – 2019-01-15 (×2): 75 mg via ORAL
  Filled 2019-01-13 (×2): qty 1

## 2019-01-13 MED ORDER — ENOXAPARIN SODIUM 60 MG/0.6ML ~~LOC~~ SOLN
1.0000 mg/kg | Freq: Every day | SUBCUTANEOUS | Status: DC
  Administered 2019-01-13: 55 mg via SUBCUTANEOUS
  Filled 2019-01-13: qty 0.6

## 2019-01-13 MED ORDER — HYDRALAZINE HCL 50 MG PO TABS
50.0000 mg | ORAL_TABLET | Freq: Four times a day (QID) | ORAL | Status: DC
  Administered 2019-01-14 – 2019-01-15 (×7): 50 mg via ORAL
  Filled 2019-01-13 (×7): qty 1

## 2019-01-13 MED ORDER — CLONIDINE HCL 0.1 MG PO TABS
0.3000 mg | ORAL_TABLET | Freq: Once | ORAL | Status: AC
  Administered 2019-01-13: 0.3 mg via ORAL
  Filled 2019-01-13: qty 3

## 2019-01-13 MED ORDER — ONDANSETRON HCL 4 MG/2ML IJ SOLN: 4.0000 mg | Freq: Four times a day (QID) | INTRAMUSCULAR | Status: DC | PRN

## 2019-01-13 MED ORDER — ASPIRIN 81 MG PO CHEW
324.0000 mg | CHEWABLE_TABLET | Freq: Once | ORAL | Status: AC
  Administered 2019-01-13: 324 mg via ORAL
  Filled 2019-01-13: qty 4

## 2019-01-13 MED ORDER — PANTOPRAZOLE SODIUM 40 MG PO TBEC
40.0000 mg | DELAYED_RELEASE_TABLET | Freq: Two times a day (BID) | ORAL | Status: DC
  Administered 2019-01-13 – 2019-01-15 (×4): 40 mg via ORAL
  Filled 2019-01-13 (×4): qty 1

## 2019-01-13 MED ORDER — MAGNESIUM OXIDE 400 (241.3 MG) MG PO TABS
200.0000 mg | ORAL_TABLET | Freq: Two times a day (BID) | ORAL | Status: DC
  Administered 2019-01-13 – 2019-01-15 (×4): 200 mg via ORAL
  Filled 2019-01-13 (×4): qty 1

## 2019-01-13 MED ORDER — SIMVASTATIN 20 MG PO TABS
10.0000 mg | ORAL_TABLET | Freq: Every day | ORAL | Status: DC
  Administered 2019-01-13: 10 mg via ORAL
  Filled 2019-01-13: qty 1

## 2019-01-13 MED ORDER — ISOSORBIDE MONONITRATE ER 30 MG PO TB24
30.0000 mg | ORAL_TABLET | Freq: Two times a day (BID) | ORAL | Status: DC
  Administered 2019-01-13 – 2019-01-15 (×4): 30 mg via ORAL
  Filled 2019-01-13 (×4): qty 1

## 2019-01-13 MED ORDER — HYDRALAZINE HCL 50 MG PO TABS
25.0000 mg | ORAL_TABLET | Freq: Once | ORAL | Status: AC
  Administered 2019-01-13: 25 mg via ORAL
  Filled 2019-01-13: qty 1

## 2019-01-13 MED ORDER — CLONIDINE HCL 0.1 MG PO TABS
0.3000 mg | ORAL_TABLET | Freq: Two times a day (BID) | ORAL | Status: DC
  Administered 2019-01-13 – 2019-01-15 (×4): 0.3 mg via ORAL
  Filled 2019-01-13 (×4): qty 3

## 2019-01-13 NOTE — ED Triage Notes (Signed)
Pt arrives via ems from home with complaints of chest pain.

## 2019-01-13 NOTE — H&P (Signed)
[facility] [place] [facility]   PATIENT NAME: Anita Ward    MR#:  [id number]  DATE OF BIRTH:  09/16/1919  DATE OF ADMISSION:  01/13/2019  PRIMARY CARE PHYSICIAN: Barbette ReichmannHande, Vishwanath, MD   REQUESTING/REFERRING PHYSICIAN:Mark Fanny BienQuale, MD  CHIEF COMPLAINT:   Chief Complaint  Patient presents with  . Chest Pain    HISTORY OF PRESENT ILLNESS:  Anita Ward  is a 85 y.o. female with a known history of CAD and mitral valve insufficiency followed by Dr. Gwen PoundsKowalski.  She also has a history of hypertension, COPD, GERD, hyperlipidemia, CHF, and anxiety.  Most recent echocardiogram of record is July 12, 2018 with a 55% ejection fraction.  She presents today to the emergency room from home where she lives with family complaining of midsternal chest pain with a pain score 6-8 out of 10.  She noted no radiation of the pain.  However, she did experience diaphoresis, weakness, and shortness of breath with the pain.  Chest pain was noted at rest.  The pain lasted about 10 minutes.  At the time of the pain, patient was about to drink an Ensure protein drink.  She denies recent illness.  No fevers, chills, nausea, vomiting, diarrhea, abdominal pain.  Chest x-ray on arrival to the emergency room demonstrates no acute pulmonary disease.  Troponin high-sensitivity is 19.  Creatinine is mildly elevated at 1.24 with BUN 29.  We have admitted her to the hospitalist service for chest pain observation.  PAST MEDICAL HISTORY:   Past Medical History:  Diagnosis Date  . Anxiety   . Congestive heart failure (CHF) (HCC)   . Coronary artery disease   . Glaucoma   . HTN (hypertension)   . Osteoporosis     PAST SURGICAL HISTORY:   Past Surgical History:  Procedure Laterality Date  . CARDIAC SURGERY    . CARDIAC SURGERY    . CORONARY ARTERY BYPASS GRAFT      SOCIAL HISTORY:   Social History   Tobacco Use  . Smoking status: Never Smoker  . Smokeless tobacco: Never Used   Substance Use Topics  . Alcohol use: No    FAMILY HISTORY:   Family History  Problem Relation Age of Onset  . Congestive Heart Failure Father   . CAD Brother     DRUG ALLERGIES:   Allergies  Allergen Reactions  . Benicar [Olmesartan] Other (See Comments)    Reaction:  Unknown   . Cardura [Doxazosin Mesylate] Other (See Comments)    Reaction:  Unknown   . Ciprofloxacin Other (See Comments)    Reaction:  Unknown  . Fosamax [Alendronate Sodium] Other (See Comments)    Reaction:  Unknown  . Lisinopril Other (See Comments)    Reaction:  Unknown   . Norvasc [Amlodipine Besylate] Other (See Comments)    Reaction:  Unknown   . Penicillins Other (See Comments)    Reaction:  Unknown   . Sulfa Antibiotics Other (See Comments)    Reaction:  Unknown   . Torsemide Anxiety    REVIEW OF SYSTEMS:   Review of Systems  Constitutional: Negative for chills, fever and malaise/fatigue.  HENT: Negative for congestion, sinus pain and sore throat.   Eyes: Negative for blurred vision and double vision.  Respiratory: Positive for shortness of breath. Negative for sputum production and wheezing.   Cardiovascular: Positive for chest pain (midsternal pressure) and palpitations. Negative for orthopnea and leg swelling.  Gastrointestinal: Negative for abdominal pain, constipation, diarrhea, heartburn, nausea  and vomiting.  Genitourinary: Negative for dysuria, flank pain and hematuria.  Musculoskeletal: Negative for falls.  Neurological: Negative for dizziness, loss of consciousness, weakness and headaches.  Psychiatric/Behavioral: Negative.  Negative for depression.      MEDICATIONS AT HOME:   Prior to Admission medications   Medication Sig Start Date End Date Taking? Authorizing Provider  ALPRAZolam (XANAX) 0.25 MG tablet Take 0.25 mg by mouth 4 (four) times daily. 12/18/18  Yes [provider]  DULoxetine (CYMBALTA) 20 MG capsule Take 20 mg by mouth daily. 01/01/19 01/01/20 Yes  [provider]  famotidine (PEPCID) 40 MG tablet Take 20 mg by mouth 2 (two) times a day. 01/01/19  Yes [provider]  ferrous sulfate 325 (65 FE) MG tablet Take 325 mg by mouth daily with breakfast. 10/02/18  Yes [provider]  isosorbide mononitrate (IMDUR) 30 MG 24 hr tablet Take 30 mg by mouth daily. 07/22/18 07/22/19 Yes [provider]  levothyroxine (SYNTHROID) 50 MCG tablet Take 50 mcg by mouth daily before breakfast. Take on an empty stomach with a glass of water at least 30 to 60 minutes before breakfast 08/07/18  Yes [provider]  metoprolol tartrate (LOPRESSOR) 25 MG tablet Take 25 mg by mouth 2 (two) times a day. 08/07/18  Yes [provider]  aspirin EC 81 MG tablet Take 81 mg by mouth daily.    [provider]  Calcium Carbonate-Vitamin D (CALCIUM 600+D) 600-400 MG-UNIT tablet Take 1 tablet by mouth 2 (two) times daily.    [provider]  cholecalciferol (VITAMIN D) 1000 UNITS tablet Take 1,000 Units by mouth every morning.     [provider]  cloNIDine (CATAPRES) 0.3 MG tablet Take 1 tablet (0.3 mg total) by mouth 2 (two) times daily. 02/22/16   Henreitta Leber, MD  clopidogrel (PLAVIX) 75 MG tablet Take 1 tablet (75 mg total) by mouth daily with breakfast. 05/29/15   Vaughan Basta, MD  clotrimazole-betamethasone (LOTRISONE) cream Apply 1 application topically 2 (two) times daily as needed.     [provider]  CRANBERRY PO Take 1 capsule by mouth every morning.     [provider]  furosemide (LASIX) 40 MG tablet Take 1 tablet (40 mg total) by mouth at bedtime. 02/03/16 02/02/17  Orbie Pyo, MD  hydrALAZINE (APRESOLINE) 50 MG tablet Take 1 tablet (50 mg total) by mouth every 6 (six) hours. 03/22/16   Gladstone Lighter, MD  losartan (COZAAR) 100 MG tablet Take 1 tablet (100 mg total) by mouth daily. Patient taking differently: Take 100 mg by mouth every morning.   02/22/16   Henreitta Leber, MD  Magnesium 200 MG TABS Take 200 mg by mouth 2 (two) times daily.    [provider]  mupirocin cream (BACTROBAN) 2 % Apply 1 application topically 2 (two) times daily. 05/12/16   Edrick Kins, DPM  nitroGLYCERIN (NITROSTAT) 0.4 MG SL tablet Place 0.4 mg under the tongue every 5 (five) minutes x 3 doses as needed for chest pain.     [provider]  pantoprazole (PROTONIX) 40 MG tablet Take 1 tablet (40 mg total) by mouth 2 (two) times daily. 03/22/16   Gladstone Lighter, MD  Potassium Chloride ER 20 MEQ TBCR Take 20 mEq by mouth every morning.     [provider]  potassium chloride SA (K-DUR) 20 MEQ tablet Take 20 mEq by mouth daily. 01/01/19   [provider]  simvastatin (ZOCOR) 10 MG tablet Take  1 tablet (10 mg total) by mouth daily at 6 PM. 05/29/15   Altamese DillingVachhani, Vaibhavkumar, MD  traMADol (ULTRAM) 50 MG tablet Take 50 mg by mouth 3 (three) times daily as needed for moderate pain.    [provider]  vitamin B-12 (CYANOCOBALAMIN) 1000 MCG tablet Take 1,000 mcg by mouth every morning.     [provider]      VITAL SIGNS:  Blood pressure (!) 172/71, pulse 78, temperature 98.2 F (36.8 C), temperature source Oral, resp. rate 18, height 5\' 2"  (1.575 m), weight 54.4 kg, SpO2 94 %.  PHYSICAL EXAMINATION:  Physical Exam  GENERAL:  85 y.o.-year-old patient lying in the bed with no acute distress.  EYES: Pupils equal, round, reactive to light and accommodation. No scleral icterus. Extraocular muscles intact.  HEENT: Head atraumatic, normocephalic. Oropharynx and nasopharynx clear.  NECK:  Supple, no jugular venous distention. No thyroid enlargement, no tenderness.  LUNGS: Normal breath sounds bilaterally, no wheezing, rales,rhonchi or crepitation. No use of accessory muscles of respiration.  CARDIOVASCULAR: Regular rate and rhythm, S1, S2 normal. No murmurs, rubs, or gallops.  ABDOMEN: Soft, nondistended,  nontender. Bowel sounds present. No organomegaly or mass.  EXTREMITIES: No pedal edema, cyanosis, or clubbing.  NEUROLOGIC: Cranial nerves II through XII are intact. Muscle strength 5/5 in all extremities. Sensation intact. Gait not checked.  PSYCHIATRIC: The patient is alert and oriented x 3.  Normal affect and good eye contact. SKIN: No obvious rash, lesion, or ulcer.   LABORATORY PANEL:   CBC Recent Labs  Lab 01/13/19 1518  WBC 12.8*  HGB 13.8  HCT 43.5  PLT 320   ------------------------------------------------------------------------------------------------------------------  Chemistries  Recent Labs  Lab 01/13/19 1544  NA 140  K 3.5  CL 101  CO2 30  GLUCOSE 97  BUN 29*  CREATININE 1.24*  CALCIUM 9.2   ------------------------------------------------------------------------------------------------------------------  Cardiac Enzymes No results for input(s): TROPONINI in the last 168 hours. ------------------------------------------------------------------------------------------------------------------  RADIOLOGY:  Ct Angio Chest Aorta W And/or Wo Contrast  Result Date: 01/13/2019 CLINICAL DATA:  85 year old female with chest pain. Concern for aortic dissection. EXAM: CT ANGIOGRAPHY CHEST WITH CONTRAST TECHNIQUE: Multidetector CT imaging of the chest was performed using the standard protocol during bolus administration of intravenous contrast. Multiplanar CT image reconstructions and MIPs were obtained to evaluate the vascular anatomy. CONTRAST:  60mL OMNIPAQUE IOHEXOL 350 MG/ML SOLN COMPARISON:  Chest radiograph dated 12/25/2017 and CT dated 11-01-2015 FINDINGS: Evaluation of this exam is limited due to respiratory motion artifact. Cardiovascular: There is moderate cardiomegaly. No pericardial effusion. Coronary vascular calcification and postsurgical changes of CABG. There is moderate atherosclerotic calcification of the thoracic aorta. No aneurysmal dilatation or  evidence of dissection. The origins of the great vessels of the aortic arch appear patent as visualized. Evaluation of the pulmonary arteries is limited due to suboptimal opacification and timing of the contrast. The central pulmonary arteries appear patent. There is dilatation of the main pulmonary trunk and central pulmonary arteries suggestive of a degree pulmonary hypertension. Mediastinum/Nodes: No hilar or mediastinal adenopathy. The esophagus is grossly unremarkable. No mediastinal fluid collection. Lungs/Pleura: There is a faint 10 x 8 mm ground-glass nodule in the right upper lobe without significant interval change since 2017. Minimal bibasilar atelectatic changes noted. There is no focal consolidation, pleural effusion, or pneumothorax. The central airways are patent. Upper Abdomen: No acute abnormality. Musculoskeletal: Osteopenia with multilevel degenerative changes and disc desiccation. Median sternotomy wires. No acute osseous pathology. Review of the MIP images confirms the above  findings. IMPRESSION: 1. No acute intrathoracic pathology. No CT evidence of aortic dissection or aneurysm. 2. Cardiomegaly with evidence of a degree of pulmonary hypertension. 3. A faint ground-glass nodule in the right upper lobe stable since 2017. No follow-up recommended. This recommendation follows the consensus statement: Guidelines for Management of Incidental Pulmonary Nodules Detected on CT Images: From the Fleischner Society 2017; Radiology 2017; 284:228-243. 4.  Aortic Atherosclerosis (ICD10-I70.0). Electronically Signed   By: Elgie Collard M.D.   On: 01/13/2019 16:51      IMPRESSION AND PLAN:   1. chest pain - We will continue to trend troponin levels -Telemetry monitoring -Repeat EKG in the a.m. -Echocardiogram - Cardiology, Dr. Gwen Pounds, consulted for further evaluation and recommendations -Metoprolol restarted -Aspirin and Plavix restarted  2.  Hypertension - Home medications including  clonidine, Tolazine, Imdur, and metoprolol restarted --We will treat persistent hypertension expectantly with PRN antihypertensive  3.  Hypothyroidism - TSH pending -Synthroid restarted  4.  Hyperlipidemia - Lipid panel pending - Simvastatin restarted  5.  GERD - Pantoprazole restarted  DVT and PPI prophylaxis initiated    All the records are reviewed and case discussed with ED provider. The plan of care was discussed in details with the patient (and family). I answered all questions. The patient agreed to proceed with the above mentioned plan. Further management will depend upon hospital course.   CODE STATUS: Full code  TOTAL TIME TAKING CARE OF THIS PATIENT:68minutes.    Milas Kocher Lakenya Riendeau CRNP on 01/13/2019 at 5:48 PM  Pager - [phone]  After 6pm go to [email or web address] - Social research officer, government  [facility]  Office  [phone]  CC: Primary care physician; Barbette Reichmann, MD   Note: This dictation was prepared with Dragon dictation along with smaller phrase technology. Any transcriptional errors that result from this process are unintentional.

## 2019-01-13 NOTE — ED Notes (Signed)
Pt assisted to toilet in room  

## 2019-01-13 NOTE — ED Notes (Signed)
Pt given apple sauce

## 2019-01-13 NOTE — ED Notes (Signed)
Floor unable to take pt due to staffing at this time

## 2019-01-13 NOTE — ED Notes (Signed)
Dr Jacqualine Code made aware of pt's elevated Troponin level as reported by lab at this time. Troponin 43 ng/L.

## 2019-01-13 NOTE — ED Provider Notes (Addendum)
[facility] Emergency Department Provider Note   ____________________________________________   First MD Initiated Contact with Patient 01/13/19 1526     (approximate)  I have reviewed the triage vital signs and the nursing notes.   HISTORY  Chief Complaint Chest Pain    HPI Anita Ward is a 85 y.o. female here for evaluation of episode of chest pain  Patient here for evaluation for chest pain.  Not long before paramedics arrived patient had episode of chest pain.  Was in the center of her chest.  She got sweaty with it and felt short of breath.  Her symptoms have resolved now.  She reports that she has had a previous heart surgery a long time ago.  She is not had any recent fevers chills cough or cold.  She has been in normal health.  Her son who helps take care of her just recently went to work  She denies any pain that was moving.  Denies pain in the back.  No headache.  No confusion.   Past Medical History:  Diagnosis Date   Anxiety    Congestive heart failure (CHF) (HCC)    Coronary artery disease    Glaucoma    HTN (hypertension)    Osteoporosis     Patient Active Problem List   Diagnosis Date Noted   Chest pain 02/19/2016   Hyponatremia 02/19/2016   MI (myocardial infarction) (HCC) 05/27/2015    Past Surgical History:  Procedure Laterality Date   CARDIAC SURGERY     CARDIAC SURGERY     CORONARY ARTERY BYPASS GRAFT      Prior to Admission medications   Medication Sig Start Date End Date Taking? Authorizing Provider  aspirin EC 81 MG EC tablet Take 1 tablet (81 mg total) by mouth daily. Patient taking differently: Take 81 mg by mouth every morning.  05/29/15   Altamese DillingVachhani, Vaibhavkumar, MD  Calcium Carbonate-Vitamin D (CALCIUM 600+D) 600-400 MG-UNIT tablet Take 1 tablet by mouth 2 (two) times daily.    [provider]  cholecalciferol (VITAMIN D) 1000 UNITS tablet Take 1,000 Units by mouth every morning.      [provider]  cloNIDine (CATAPRES) 0.3 MG tablet Take 1 tablet (0.3 mg total) by mouth 2 (two) times daily. 02/22/16   Houston SirenSainani, Vivek J, MD  clopidogrel (PLAVIX) 75 MG tablet Take 1 tablet (75 mg total) by mouth daily with breakfast. 05/29/15   Altamese DillingVachhani, Vaibhavkumar, MD  clotrimazole-betamethasone (LOTRISONE) cream Apply 1 application topically 2 (two) times daily as needed.     [provider]  CRANBERRY PO Take 1 capsule by mouth every morning.     [provider]  famotidine (PEPCID) 20 MG tablet Take 1 tablet (20 mg total) by mouth 2 (two) times daily. 03/22/16   Enid BaasKalisetti, Radhika, MD  furosemide (LASIX) 40 MG tablet Take 1 tablet (40 mg total) by mouth at bedtime. 02/03/16 02/02/17  Myrna BlazerSchaevitz, David Matthew, MD  hydrALAZINE (APRESOLINE) 50 MG tablet Take 1 tablet (50 mg total) by mouth every 6 (six) hours. 03/22/16   Enid BaasKalisetti, Radhika, MD  isosorbide mononitrate (IMDUR) 30 MG 24 hr tablet Take 30 mg by mouth 2 (two) times daily.    [provider]  levothyroxine (SYNTHROID, LEVOTHROID) 25 MCG tablet Take 25 mcg by mouth every morning.     [provider]  losartan (COZAAR) 100 MG tablet Take 1 tablet (100 mg total) by mouth daily. Patient taking differently: Take 100 mg by mouth every  morning.  02/22/16   Henreitta Leber, MD  Magnesium 200 MG TABS Take 200 mg by mouth 2 (two) times daily.    [provider]  metoprolol tartrate (LOPRESSOR) 25 MG tablet Take 1 tablet (25 mg total) by mouth 2 (two) times daily. 03/22/16   Gladstone Lighter, MD  mupirocin cream (BACTROBAN) 2 % Apply 1 application topically 2 (two) times daily. 05/12/16   Edrick Kins, DPM  nitroGLYCERIN (NITROSTAT) 0.4 MG SL tablet Place 0.4 mg under the tongue every 5 (five) minutes x 3 doses as needed for chest pain.     [provider]  pantoprazole (PROTONIX) 40 MG tablet Take 1 tablet (40 mg total) by mouth 2 (two) times daily. 03/22/16   Gladstone Lighter, MD    Potassium Chloride ER 20 MEQ TBCR Take 20 mEq by mouth every morning.     [provider]  simvastatin (ZOCOR) 10 MG tablet Take 1 tablet (10 mg total) by mouth daily at 6 PM. 05/29/15   Vaughan Basta, MD  traMADol (ULTRAM) 50 MG tablet Take 50 mg by mouth 3 (three) times daily as needed for moderate pain.    [provider]  vitamin B-12 (CYANOCOBALAMIN) 1000 MCG tablet Take 1,000 mcg by mouth every morning.     [provider]    Allergies Benicar [olmesartan], Cardura [doxazosin mesylate], Ciprofloxacin, Fosamax [alendronate sodium], Lisinopril, Norvasc [amlodipine besylate], Penicillins, Sulfa antibiotics, and Torsemide  Family History  Problem Relation Age of Onset   Congestive Heart Failure Father    CAD Brother     Social History Social History   Tobacco Use   Smoking status: Never Smoker   Smokeless tobacco: Never Used  Substance Use Topics   Alcohol use: No   Drug use: No    Review of Systems Constitutional: No fever/chills Eyes: No visual changes. ENT: No sore throat. Cardiovascular: See HPI respiratory: See HPI gastrointestinal: No abdominal pain.   Genitourinary: Negative for dysuria. Musculoskeletal: Negative for back pain. Skin: Negative for rash. Neurological: Negative for headaches, areas of focal weakness or numbness.    ____________________________________________   PHYSICAL EXAM:  VITAL SIGNS: ED Triage Vitals  Enc Vitals Group     BP 01/13/19 1513 (!) 215/95     Pulse Rate 01/13/19 1513 94     Resp 01/13/19 1513 19     Temp 01/13/19 1513 98.2 F (36.8 C)     Temp Source 01/13/19 1513 Oral     SpO2 01/13/19 1513 99 %     Weight 01/13/19 1511 120 lb (54.4 kg)     Height 01/13/19 1511 5\' 2"  (1.575 m)     Head Circumference --      Peak Flow --      Pain Score 01/13/19 1513 5     Pain Loc --      Pain Edu? --      Excl. in [place]? --     Constitutional: Alert and oriented. Well appearing and in no  acute distress.  Rather elderly and frail in appearance. Eyes: Conjunctivae are normal. Head: Atraumatic. Nose: No congestion/rhinnorhea. Mouth/Throat: Mucous membranes are moist. Neck: No stridor.  Cardiovascular: Normal rate, regular rhythm. Grossly normal heart sounds.  Good peripheral circulation.  Old midline surgical scar Respiratory: Normal respiratory effort.  No retractions. Lungs CTAB. Gastrointestinal: Soft and nontender. No distention. Musculoskeletal: No lower extremity tenderness nor edema. Neurologic:  Normal speech and language. No gross focal neurologic deficits are appreciated.  Skin:  Skin is  warm, dry and intact. No rash noted. Psychiatric: Mood and affect are normal. Speech and behavior are normal.  ____________________________________________   LABS (all labs ordered are listed, but only abnormal results are displayed)  Labs Reviewed  CBC - Abnormal; Notable for the following components:      Result Value   WBC 12.8 (*)    RBC 5.22 (*)    All other components within normal limits  BASIC METABOLIC PANEL - Abnormal; Notable for the following components:   BUN 29 (*)    Creatinine, Ser 1.24 (*)    GFR calc non Af Amer 36 (*)    GFR calc Af Amer 42 (*)    All other components within normal limits  TROPONIN I (HIGH SENSITIVITY) - Abnormal; Notable for the following components:   Troponin I (High Sensitivity) 19 (*)    All other components within normal limits  PROTIME-INR  APTT  GLUCOSE, CAPILLARY  CBG MONITORING, ED   ____________________________________________  EKG  Compared with previous, there is an old left bundle Reviewed at 1515 Heart rate 90 QRS 140 QTc 500 Normal sinus rhythm, first-degree AV block, left bundle branch block ____________________________________________  RADIOLOGY  Ct Angio Chest Aorta W And/or Wo Contrast  Result Date: 01/13/2019 CLINICAL DATA:  85 year old female with chest pain. Concern for aortic dissection. EXAM: CT  ANGIOGRAPHY CHEST WITH CONTRAST TECHNIQUE: Multidetector CT imaging of the chest was performed using the standard protocol during bolus administration of intravenous contrast. Multiplanar CT image reconstructions and MIPs were obtained to evaluate the vascular anatomy. CONTRAST:  60mL OMNIPAQUE IOHEXOL 350 MG/ML SOLN COMPARISON:  Chest radiograph dated 12/25/2017 and CT dated 12/29/2015 FINDINGS: Evaluation of this exam is limited due to respiratory motion artifact. Cardiovascular: There is moderate cardiomegaly. No pericardial effusion. Coronary vascular calcification and postsurgical changes of CABG. There is moderate atherosclerotic calcification of the thoracic aorta. No aneurysmal dilatation or evidence of dissection. The origins of the great vessels of the aortic arch appear patent as visualized. Evaluation of the pulmonary arteries is limited due to suboptimal opacification and timing of the contrast. The central pulmonary arteries appear patent. There is dilatation of the main pulmonary trunk and central pulmonary arteries suggestive of a degree pulmonary hypertension. Mediastinum/Nodes: No hilar or mediastinal adenopathy. The esophagus is grossly unremarkable. No mediastinal fluid collection. Lungs/Pleura: There is a faint 10 x 8 mm ground-glass nodule in the right upper lobe without significant interval change since 2017. Minimal bibasilar atelectatic changes noted. There is no focal consolidation, pleural effusion, or pneumothorax. The central airways are patent. Upper Abdomen: No acute abnormality. Musculoskeletal: Osteopenia with multilevel degenerative changes and disc desiccation. Median sternotomy wires. No acute osseous pathology. Review of the MIP images confirms the above findings. IMPRESSION: 1. No acute intrathoracic pathology. No CT evidence of aortic dissection or aneurysm. 2. Cardiomegaly with evidence of a degree of pulmonary hypertension. 3. A faint ground-glass nodule in the right upper  lobe stable since 2017. No follow-up recommended. This recommendation follows the consensus statement: Guidelines for Management of Incidental Pulmonary Nodules Detected on CT Images: From the Fleischner Society 2017; Radiology 2017; 284:228-243. 4.  Aortic Atherosclerosis (ICD10-I70.0). Electronically Signed   By: Elgie CollardArash  Radparvar M.D.   On: 01/13/2019 16:51   CT angiogram reviewed.  Reviewed as above.   ____________________________________________   PROCEDURES  Procedure(s) performed: None  Procedures  Critical Care performed: No  ____________________________________________   INITIAL IMPRESSION / ASSESSMENT AND PLAN / ED COURSE  Pertinent labs & imaging results that were  available during my care of the patient were reviewed by me and considered in my medical decision making (see chart for details).   Differential diagnosis includes, but is not limited to, ACS, aortic dissection, pulmonary embolism, cardiac tamponade, pneumothorax, pneumonia, pericarditis, myocarditis, GI-related causes including esophagitis/gastritis, and musculoskeletal chest wall pain.  Patient also noted to be severely hypertensive.  She does have baseline hypertension on medications including clonidine and hydralazine which I have confirms her recent primary care notes as well.  I will dose her with her clonidine and hydralazine for blood pressure control, additionally given her associated chest pain diaphoresis and shortness of breath with hypertension and her age I will obtain a CT angiogram to exclude dissection and look for other etiology of chest pain.  Her initial presentation her chest pain being gone now along with a old left bundle branch block are reassuring that there is no acute evidence of STEMI but she certainly will require rule out as felt to be at high risk for ACS given her age and previous history.  ----------------------------------------- 4:08 PM on  01/13/2019 -----------------------------------------  Remains notably hypertensive, oral antihypertensives that she takes at home ordered.  Remains pain-free.     ----------------------------------------- 5:09 PM on 01/13/2019 -----------------------------------------  Patient remains pain-free.  Will admit for further work-up given hypertension, presentation of chest pain, and risk factors including age and prior coronary disease with concerning symptoms including chest pressure and sweating.  Currently asymptomatic and her blood pressure has improved.  Patient comfortable with plan, requesting something to eat and an Ensure shake  Discussed with hospitalist nurse practitioner Audrie Lia  HEAR Score: 6    ____________________________________________   FINAL CLINICAL IMPRESSION(S) / ED DIAGNOSES  Final diagnoses:  Chest pain, moderate coronary artery risk  Hypertensive urgency        Note:  This document was prepared using Dragon voice recognition software and may include unintentional dictation errors       Sharyn Creamer, MD 01/13/19 1710    Sharyn Creamer, MD 01/13/19 1711

## 2019-01-13 NOTE — ED Notes (Signed)
Spoke with son who was updated on pt's current status

## 2019-01-13 NOTE — ED Notes (Addendum)
ED TO INPATIENT HANDOFF REPORT  ED Nurse Name and Phone #: Servando Ward Name/Age/Gender Anita Ward 85 y.o. female Room/Bed: ED02A/ED02A  Code Status   Code Status: Full Code  Home/SNF/Other Home (with son) Patient oriented to: self, place, and situation Is this baseline? Yes   Triage Complete: Triage complete  Chief Complaint  stemi?  Triage Note Pt arrives via ems from home with complaints of chest pain.    Allergies Allergies  Allergen Reactions  . Benicar [Olmesartan] Other (See Comments)    Reaction:  Unknown   . Cardura [Doxazosin Mesylate] Other (See Comments)    Reaction:  Unknown   . Ciprofloxacin Other (See Comments)    Reaction:  Unknown  . Fosamax [Alendronate Sodium] Other (See Comments)    Reaction:  Unknown  . Lisinopril Other (See Comments)    Reaction:  Unknown   . Norvasc [Amlodipine Besylate] Other (See Comments)    Reaction:  Unknown   . Penicillins Other (See Comments)    Reaction:  Unknown   . Sulfa Antibiotics Other (See Comments)    Reaction:  Unknown   . Torsemide Anxiety    Level of Care/Admitting Diagnosis ED Disposition    ED Disposition Condition Comment   Admit  Hospital Area: [facility] [[id number]]  Level of Care: Med-Surg [16]  Covid Evaluation: Confirmed COVID Negative  Diagnosis: Chest pain [[id number]]  Admitting Physician: Pearletha Alfred [[id number]]  Attending Physician: Pearletha Alfred [[id number]]  PT Class (Do Not Modify): Observation [104]  PT Acc Code (Do Not Modify): Observation [10022]       B Medical/Surgery History Past Medical History:  Diagnosis Date  . Anxiety   . Congestive heart failure (CHF) (HCC)   . Coronary artery disease   . Glaucoma   . HTN (hypertension)   . Osteoporosis    Past Surgical History:  Procedure Laterality Date  . CARDIAC SURGERY    . CARDIAC SURGERY    . CORONARY ARTERY BYPASS GRAFT       A IV Location/Drains/Wounds Patient Lines/Drains/Airways Status    Active Line/Drains/Airways    Name:   Placement date:   Placement time:   Site:   Days:   Peripheral IV 01/13/19 Right Antecubital   01/13/19    1512    Antecubital   less than 1   Peripheral IV 01/13/19 Left Antecubital   01/13/19    1512    Antecubital   less than 1          Intake/Output Last 24 hours No intake or output data in the 24 hours ending 01/13/19 1821  Labs/Imaging Results for orders placed or performed during the hospital encounter of 01/13/19 (from the past 48 hour(s))  CBC     Status: Abnormal   Collection Time: 01/13/19  3:18 PM  Result Value Ref Range   WBC 12.8 (H) 4.0 - 10.5 K/uL   RBC 5.22 (H) 3.87 - 5.11 MIL/uL   Hemoglobin 13.8 12.0 - 15.0 g/dL   HCT 37.8 58.8 - 50.2 %   MCV 83.3 80.0 - 100.0 fL   MCH 26.4 26.0 - 34.0 pg   MCHC 31.7 30.0 - 36.0 g/dL   RDW 77.4 12.8 - 78.6 %   Platelets 320 150 - 400 K/uL   nRBC 0.0 0.0 - 0.2 %    Comment: Performed at [facility], [address], [place], [place] [address]  Basic metabolic panel     Status: Abnormal  Collection Time: 01/13/19  3:44 PM  Result Value Ref Range   Sodium 140 135 - 145 mmol/L   Potassium 3.5 3.5 - 5.1 mmol/L   Chloride 101 98 - 111 mmol/L   CO2 30 22 - 32 mmol/L   Glucose, Bld 97 70 - 99 mg/dL   BUN 29 (H) 8 - 23 mg/dL   Creatinine, Ser 0.981.24 (H) 0.44 - 1.00 mg/dL   Calcium 9.2 8.9 - 11.910.3 mg/dL   GFR calc non Af Amer 36 (L) >60 mL/min   GFR calc Af Amer 42 (L) >60 mL/min   Anion gap 9 5 - 15    Comment: Performed at [facility], [address] [place], [place] [address]  Protime-INR     Status: None   Collection Time: 01/13/19  3:44 PM  Result Value Ref Range   Prothrombin Time 13.1 11.4 - 15.2 seconds   INR 1.0 0.8 - 1.2    Comment: (NOTE) INR goal varies based on device and disease states. Performed at [facility], [address] [place], [place] [address]   APTT     Status: None   Collection Time: 01/13/19  3:44 PM  Result Value Ref  Range   aPTT 30 24 - 36 seconds    Comment: Performed at [facility], [address] [place], [place] [address]  Troponin I (High Sensitivity)     Status: Abnormal   Collection Time: 01/13/19  3:44 PM  Result Value Ref Range   Troponin I (High Sensitivity) 19 (H) <18 ng/L    Comment: (NOTE) Elevated high sensitivity troponin I (hsTnI) values and significant  changes across serial measurements may suggest ACS but many other  chronic and acute conditions are known to elevate hsTnI results.  Refer to the "Links" section for chest pain algorithms and additional  guidance. Performed at [facility], [address] [place], [place] [address]   Glucose, capillary     Status: None   Collection Time: 01/13/19  4:20 PM  Result Value Ref Range   Glucose-Capillary 96 70 - 99 mg/dL   Ct Angio Chest Aorta W And/or Wo Contrast  Result Date: 01/13/2019 CLINICAL DATA:  85 year old female with chest pain. Concern for aortic dissection. EXAM: CT ANGIOGRAPHY CHEST WITH CONTRAST TECHNIQUE: Multidetector CT imaging of the chest was performed using the standard protocol during bolus administration of intravenous contrast. Multiplanar CT image reconstructions and MIPs were obtained to evaluate the vascular anatomy. CONTRAST:  60mL OMNIPAQUE IOHEXOL 350 MG/ML SOLN COMPARISON:  Chest radiograph dated 12/25/2017 and CT dated 03/20/2016 FINDINGS: Evaluation of this exam is limited due to respiratory motion artifact. Cardiovascular: There is moderate cardiomegaly. No pericardial effusion. Coronary vascular calcification and postsurgical changes of CABG. There is moderate atherosclerotic calcification of the thoracic aorta. No aneurysmal dilatation or evidence of dissection. The origins of the great vessels of the aortic arch appear patent as visualized. Evaluation of the pulmonary arteries is limited due to suboptimal opacification and timing of the contrast. The central pulmonary arteries appear  patent. There is dilatation of the main pulmonary trunk and central pulmonary arteries suggestive of a degree pulmonary hypertension. Mediastinum/Nodes: No hilar or mediastinal adenopathy. The esophagus is grossly unremarkable. No mediastinal fluid collection. Lungs/Pleura: There is a faint 10 x 8 mm ground-glass nodule in the right upper lobe without significant interval change since 2017. Minimal bibasilar atelectatic changes noted. There is no focal consolidation, pleural effusion, or pneumothorax. The central airways are patent. Upper Abdomen:  No acute abnormality. Musculoskeletal: Osteopenia with multilevel degenerative changes and disc desiccation. Median sternotomy wires. No acute osseous pathology. Review of the MIP images confirms the above findings. IMPRESSION: 1. No acute intrathoracic pathology. No CT evidence of aortic dissection or aneurysm. 2. Cardiomegaly with evidence of a degree of pulmonary hypertension. 3. A faint ground-glass nodule in the right upper lobe stable since 2017. No follow-up recommended. This recommendation follows the consensus statement: Guidelines for Management of Incidental Pulmonary Nodules Detected on CT Images: From the Fleischner Society 2017; Radiology 2017; 284:228-243. 4.  Aortic Atherosclerosis (ICD10-I70.0). Electronically Signed   By: Anner Crete M.D.   On: 01/13/2019 16:51    Pending Labs Unresulted Labs (From admission, onward)    Start     Ordered   01/20/19 0500  Creatinine, serum  (enoxaparin (LOVENOX) full dose)  Weekly,   STAT    Comments: while on enoxaparin therapy.    01/13/19 1809   01/13/19 1810  CBC  (enoxaparin (LOVENOX) full dose)  Once,   STAT    Comments: Baseline for enoxaparin therapy IF NOT ALREADY DRAWN.  Notify MD if PLT < 100 K.    01/13/19 1809   01/13/19 1810  Creatinine, serum  (enoxaparin (LOVENOX) full dose)  Once,   STAT    Comments: Baseline for enoxaparin therapy IF NOT ALREADY DRAWN.    01/13/19 1809   01/13/19  1746  Troponin I (High Sensitivity)  STAT Now then every 2 hours,   STAT    Question:  Indication  Answer:  Suspect ACS   01/13/19 1745   01/13/19 1744  Lipid panel  Once,   STAT     01/13/19 1744          Vitals/Pain Today's Vitals   01/13/19 1530 01/13/19 1600 01/13/19 1700 01/13/19 1800  BP: (!) 194/105 (!) 198/86 (!) 172/71 (!) 136/55  Pulse: 94 91 78 77  Resp: 19 17 18 15   Temp:      TempSrc:      SpO2: 98% 96% 94% 95%  Weight:      Height:      PainSc:        Isolation Precautions No active isolations  Medications Medications  feeding supplement (ENSURE ENLIVE) (ENSURE ENLIVE) liquid 237 mL (has no administration in time range)  aspirin EC tablet 81 mg (has no administration in time range)  cloNIDine (CATAPRES) tablet 0.3 mg (has no administration in time range)  clopidogrel (PLAVIX) tablet 75 mg (has no administration in time range)  famotidine (PEPCID) tablet 20 mg (has no administration in time range)  hydrALAZINE (APRESOLINE) tablet 50 mg (has no administration in time range)  metoprolol tartrate (LOPRESSOR) tablet 25 mg (has no administration in time range)  pantoprazole (PROTONIX) EC tablet 40 mg (has no administration in time range)  simvastatin (ZOCOR) tablet 10 mg (has no administration in time range)  isosorbide mononitrate (IMDUR) 24 hr tablet 30 mg (has no administration in time range)  levothyroxine (SYNTHROID) tablet 25 mcg (has no administration in time range)  magnesium oxide (MAG-OX) tablet 200 mg (has no administration in time range)  potassium chloride (KLOR-CON) packet 20 mEq (has no administration in time range)  vitamin B-12 (CYANOCOBALAMIN) tablet 1,000 mcg (has no administration in time range)  acetaminophen (TYLENOL) tablet 650 mg (has no administration in time range)  ondansetron (ZOFRAN) injection 4 mg (has no administration in time range)  enoxaparin (LOVENOX) injection 55 mg (has no administration in time range)  aspirin chewable tablet  324 mg (324 mg Oral Given 01/13/19 1531)  cloNIDine (CATAPRES) tablet 0.3 mg (0.3 mg Oral Given 01/13/19 1615)  hydrALAZINE (APRESOLINE) tablet 25 mg (25 mg Oral Given 01/13/19 1615)  iohexol (OMNIPAQUE) 350 MG/ML injection 60 mL (60 mLs Intravenous Contrast Given 01/13/19 1627)    Mobility walks with person assist Moderate fall risk   Focused Assessments Cardiac Assessment Handoff:    Lab Results  Component Value Date   CKTOTAL 63 05/26/2012   CKMB 1.3 05/26/2012   TROPONINI <0.03 12/25/2017   No results found for: DDIMER Does the Patient currently have chest pain? No     R Recommendations: See Admitting Provider Note  Report given to:   Additional Notes:

## 2019-01-14 ENCOUNTER — Other Ambulatory Visit: Payer: Self-pay

## 2019-01-14 ENCOUNTER — Observation Stay
Admit: 2019-01-14 | Discharge: 2019-01-14 | Disposition: A | Discharge status: Home / Self Care | Payer: Medicare Other | Attending: Nurse Practitioner | Admitting: Nurse Practitioner

## 2019-01-14 DIAGNOSIS — Z79899 Other long term (current) drug therapy: Secondary | ICD-10-CM | POA: Diagnosis not present

## 2019-01-14 DIAGNOSIS — I214 Non-ST elevation (NSTEMI) myocardial infarction: Secondary | ICD-10-CM | POA: Diagnosis present

## 2019-01-14 DIAGNOSIS — Z7902 Long term (current) use of antithrombotics/antiplatelets: Secondary | ICD-10-CM | POA: Diagnosis not present

## 2019-01-14 DIAGNOSIS — I440 Atrioventricular block, first degree: Secondary | ICD-10-CM | POA: Diagnosis present

## 2019-01-14 DIAGNOSIS — Z951 Presence of aortocoronary bypass graft: Secondary | ICD-10-CM | POA: Diagnosis not present

## 2019-01-14 DIAGNOSIS — E039 Hypothyroidism, unspecified: Secondary | ICD-10-CM | POA: Diagnosis present

## 2019-01-14 DIAGNOSIS — I447 Left bundle-branch block, unspecified: Secondary | ICD-10-CM | POA: Diagnosis present

## 2019-01-14 DIAGNOSIS — Z881 Allergy status to other antibiotic agents status: Secondary | ICD-10-CM | POA: Diagnosis not present

## 2019-01-14 DIAGNOSIS — I340 Nonrheumatic mitral (valve) insufficiency: Secondary | ICD-10-CM | POA: Diagnosis present

## 2019-01-14 DIAGNOSIS — R079 Chest pain, unspecified: Secondary | ICD-10-CM | POA: Diagnosis present

## 2019-01-14 DIAGNOSIS — Z888 Allergy status to other drugs, medicaments and biological substances status: Secondary | ICD-10-CM | POA: Diagnosis not present

## 2019-01-14 DIAGNOSIS — Z1159 Encounter for screening for other viral diseases: Secondary | ICD-10-CM | POA: Diagnosis not present

## 2019-01-14 DIAGNOSIS — Z882 Allergy status to sulfonamides status: Secondary | ICD-10-CM | POA: Diagnosis not present

## 2019-01-14 DIAGNOSIS — M810 Age-related osteoporosis without current pathological fracture: Secondary | ICD-10-CM | POA: Diagnosis present

## 2019-01-14 DIAGNOSIS — I160 Hypertensive urgency: Secondary | ICD-10-CM | POA: Diagnosis present

## 2019-01-14 DIAGNOSIS — I2510 Atherosclerotic heart disease of native coronary artery without angina pectoris: Secondary | ICD-10-CM | POA: Diagnosis present

## 2019-01-14 DIAGNOSIS — R7989 Other specified abnormal findings of blood chemistry: Secondary | ICD-10-CM

## 2019-01-14 DIAGNOSIS — Z7982 Long term (current) use of aspirin: Secondary | ICD-10-CM | POA: Diagnosis not present

## 2019-01-14 DIAGNOSIS — E782 Mixed hyperlipidemia: Secondary | ICD-10-CM | POA: Diagnosis present

## 2019-01-14 DIAGNOSIS — Z79890 Hormone replacement therapy (postmenopausal): Secondary | ICD-10-CM | POA: Diagnosis not present

## 2019-01-14 DIAGNOSIS — H409 Unspecified glaucoma: Secondary | ICD-10-CM | POA: Diagnosis present

## 2019-01-14 DIAGNOSIS — I110 Hypertensive heart disease with heart failure: Secondary | ICD-10-CM | POA: Diagnosis present

## 2019-01-14 DIAGNOSIS — Z880 Allergy status to penicillin: Secondary | ICD-10-CM | POA: Diagnosis not present

## 2019-01-14 DIAGNOSIS — Z8249 Family history of ischemic heart disease and other diseases of the circulatory system: Secondary | ICD-10-CM | POA: Diagnosis not present

## 2019-01-14 DIAGNOSIS — E785 Hyperlipidemia, unspecified: Secondary | ICD-10-CM | POA: Diagnosis present

## 2019-01-14 DIAGNOSIS — K219 Gastro-esophageal reflux disease without esophagitis: Secondary | ICD-10-CM | POA: Diagnosis present

## 2019-01-14 LAB — TROPONIN I (HIGH SENSITIVITY)
Troponin I (High Sensitivity): 43 ng/L — ABNORMAL HIGH (ref <18)
Troponin I (High Sensitivity): 83 ng/L — ABNORMAL HIGH (ref <18)
Troponin I (High Sensitivity): 63 ng/L — ABNORMAL HIGH (ref <18)

## 2019-01-14 LAB — ECHOCARDIOGRAM COMPLETE
Height: 62 in
Weight: 1920 oz

## 2019-01-14 LAB — CBC
HCT: 39.7 % (ref 36.0–46.0)
Hemoglobin: 12.9 g/dL (ref 12.0–15.0)
MCH: 26.6 pg (ref 26.0–34.0)
MCHC: 32.5 g/dL (ref 30.0–36.0)
MCV: 81.9 fL (ref 80.0–100.0)
Platelets: 325 10*3/uL (ref 150–400)
RBC: 4.85 MIL/uL (ref 3.87–5.11)
RDW: 14 % (ref 11.5–15.5)
WBC: 13.5 10*3/uL — ABNORMAL HIGH (ref 4.0–10.5)
nRBC: 0 % (ref 0.0–0.2)

## 2019-01-14 MED ORDER — LOSARTAN POTASSIUM 50 MG PO TABS
100.0000 mg | ORAL_TABLET | Freq: Every day | ORAL | Status: DC
  Administered 2019-01-14 – 2019-01-15 (×2): 100 mg via ORAL
  Filled 2019-01-14 (×2): qty 2

## 2019-01-14 MED ORDER — HEPARIN BOLUS VIA INFUSION
3000.0000 [IU] | Freq: Once | INTRAVENOUS | Status: AC
  Administered 2019-01-14: 3000 [IU] via INTRAVENOUS
  Filled 2019-01-14: qty 3000

## 2019-01-14 MED ORDER — HEPARIN (PORCINE) 25000 UT/250ML-% IV SOLN
750.0000 [IU]/h | INTRAVENOUS | Status: DC
  Administered 2019-01-14: 600 [IU]/h via INTRAVENOUS
  Filled 2019-01-14: qty 250

## 2019-01-14 MED ORDER — ROSUVASTATIN CALCIUM 10 MG PO TABS
20.0000 mg | ORAL_TABLET | Freq: Every day | ORAL | Status: DC
  Administered 2019-01-14: 20 mg via ORAL
  Filled 2019-01-14: qty 2

## 2019-01-14 MED ORDER — MAGNESIUM HYDROXIDE 400 MG/5ML PO SUSP
30.0000 mL | Freq: Once | ORAL | Status: AC
  Administered 2019-01-14: 30 mL via ORAL
  Filled 2019-01-14: qty 30

## 2019-01-14 MED ORDER — FAMOTIDINE 20 MG PO TABS
10.0000 mg | ORAL_TABLET | Freq: Every day | ORAL | Status: DC
  Administered 2019-01-15: 10 mg via ORAL
  Filled 2019-01-14: qty 1

## 2019-01-14 NOTE — Consult Note (Signed)
ANTICOAGULATION CONSULT NOTE - Initial Consult  Pharmacy Consult for heparin dosing  Indication: chest pain/ACS  Allergies  Allergen Reactions  . Benicar [Olmesartan] Other (See Comments)    Reaction:  Unknown   . Cardura [Doxazosin Mesylate] Other (See Comments)    Reaction:  Unknown   . Ciprofloxacin Other (See Comments)    Reaction:  Unknown  . Fosamax [Alendronate Sodium] Other (See Comments)    Reaction:  Unknown  . Lisinopril Other (See Comments)    Reaction:  Unknown   . Norvasc [Amlodipine Besylate] Other (See Comments)    Reaction:  Unknown   . Penicillins Other (See Comments)    Reaction:  Unknown   . Sulfa Antibiotics Other (See Comments)    Reaction:  Unknown   . Torsemide Anxiety   Patient Measurements: Height: 5\' 2"  (157.5 cm) Weight: 120 lb (54.4 kg) IBW/kg (Calculated) : 50.1 Vital Signs: Temp: 98.2 F (36.8 C) (07/06 2138) Temp Source: Oral (07/06 2138) BP: 198/67 (07/07 0040) Pulse Rate: 81 (07/07 0040)  Labs: Recent Labs    01/13/19 1518 01/13/19 1544 01/13/19 1803 01/13/19 2156  HGB 13.8  --   --   --   HCT 43.5  --   --   --   PLT 320  --   --   --   APTT  --  30  --   --   LABPROT  --  13.1  --   --   INR  --  1.0  --   --   CREATININE  --  1.24*  --   --   TROPONINIHS  --  19* 43* 83*   Estimated Creatinine Clearance: 20.5 mL/min (A) (by C-G formula based on SCr of 1.24 mg/dL (H)).  Medical History: Past Medical History:  Diagnosis Date  . Anxiety   . Congestive heart failure (CHF) ([place])   . Coronary artery disease   . Glaucoma   . HTN (hypertension)   . Osteoporosis    Assessment: Pharmacy consulted for heparin infusion dosing and monitoring in 85 yo female admitted with chest pain. Patient received treatment dose enoxaparin 55mg  (1mg /kg)  7/6 @ 2030.   Troponin I (High sensitivity): 19>> 43>> 83  Goal of Therapy:  APTT:66-102s  Heparin level 0.3-0.7 units/ml Monitor platelets by anticoagulation protocol: Yes   Plan:   Heparin infusion will be started 24 hours after treatment dose enoxaparin dose (Heparin infusion to start 7/7 @ 2030).  Will order heparin 3000 unit bolus and  Start heparin infusion at 600 units/hr Check anti-Xa level  8 hours after infusion starts and daily while on heparin Continue to monitor H&H and platelets  Plan has been discussed with Gardiner Barefoot, NP  Pernell Dupre, PharmD, BCPS Clinical Pharmacist 01/14/2019 3:21 AM

## 2019-01-14 NOTE — Progress Notes (Addendum)
Cardiac Rehab Navigator/ Exercise Physiologist Note  This EP rounded on patient to provide education. Patient was sleeping. This EP will follow up later.   This EP returned to patient's room. Patient sitting up in recliner and reports being very cold. This EP provided patient with warm blanket. Patient was very thankful.  "Heart Attack Bouncing Back" booklet given and reviewed with patient. Discussed the definition of CAD.  Discussed modifiable risk factors including controlling blood pressure, cholesterol, and blood sugar; following heart healthy diet; maintaining healthy weight; exercise; and smoking cessation, not applicable. Patient reports that her son and daughter-in-law bring her groceries once a week.   Discussed cardiac medications including rationale for taking, mechanisms of action, and side effects. Stressed the importance of taking medications as prescribed. Patient reports that her grandson sorts her medications and helps her to take them as prescribed.   Discussed emergency plan for heart attack symptoms. Patient verbalized understanding of need to call 911 and not to drive himself to ER if having cardiac symptoms / chest pain.  Diet of low sodium, low fat, low cholesterol heart healthy diet discussed. Information on diet provided. Note: Diet education has been completed by Dietitian.  Smoking Cessation - Patient is a  NEVER every day smoker.  Exercise - Benefits of exercised discussed. Patient is sedentary but very self sufficient for her age. Patient lives alone. Patient reports that her son and grandson check in on her and or come by her house daily. Patient is not an appropriate candidate for Cardiac Rehab at this time due to age, transportation, and limited mobility.   Patient appreciative of the information.  Patient reports wanting to go home and being worried about her home and wanting to make sure everything is okay there. This EP will report to RN.  Jasper Loser,  [facility]  Exercise Physiologist Department Phone #: [phone] Fax: [phone]  Direct Line [phone] Email Address: [email or web address]

## 2019-01-14 NOTE — Consult Note (Signed)
[facility]  Patient ID: Anita SalinesDorothy H Ward, MRN: [id number], DOB/AGE: 83/02/1920 83 y.o. Admit date: 01/13/2019   Date of Consult: 01/14/2019 Primary Physician: Barbette ReichmannHande, Vishwanath, MD Primary Cardiologist: Gwen PoundsKowalski  Chief Complaint:  Chief Complaint  Patient presents with  . Chest Pain   Reason for Consult: Myocardial infarction  HPI: 85 y.o. female with and known coronary artery disease essential hypertension mixed hyperlipidemia status post coronary to bypass graft in the remote past on appropriate medication management for further risk reduction having episode of chest discomfort lasting several hours and relieved by medications listed as well as spontaneously with an abnormal EKG.  She has had an abnormal EKG in the past which shows normal sinus rhythm with first-degree AV block and left bundle branch block.  Her troponin level is 83 consistent with non-ST elevation myocardial infarction.  She has had full resolution of her chest discomfort and feels quite well today and hemodynamically stable.  Home medications has been reinstated for which the patient is tolerating well.  Due to the patient's advanced age we would prefer medical management of her current issues  Past Medical History:  Diagnosis Date  . Anxiety   . Congestive heart failure (CHF) ([facility])   . Coronary artery disease   . Glaucoma   . HTN (hypertension)   . Osteoporosis       Surgical History:  Past Surgical History:  Procedure Laterality Date  . CARDIAC SURGERY    . CARDIAC SURGERY    . CORONARY ARTERY BYPASS GRAFT       Home Meds: Prior to Admission medications   Medication Sig Start Date End Date Taking? Authorizing Provider  ALPRAZolam (XANAX) 0.25 MG tablet Take 0.25 mg by mouth 4 (four) times daily. 12/18/18  Yes [provider]  aspirin EC 81 MG tablet Take 81 mg by mouth daily.   Yes [provider]  Calcium Carbonate-Vitamin D (CALCIUM 600+D) 600-400 MG-UNIT  tablet Take 1 tablet by mouth 2 (two) times daily.   Yes [provider]  cholecalciferol (VITAMIN D) 1000 UNITS tablet Take 1,000 Units by mouth every morning.    Yes [provider]  cloNIDine (CATAPRES) 0.3 MG tablet Take 1 tablet (0.3 mg total) by mouth 2 (two) times daily. 02/22/16  Yes Houston SirenSainani, Vivek J, MD  clopidogrel (PLAVIX) 75 MG tablet Take 1 tablet (75 mg total) by mouth daily with breakfast. 05/29/15  Yes Altamese DillingVachhani, Vaibhavkumar, MD  CRANBERRY PO Take 1 capsule by mouth every morning.    Yes [provider]  famotidine (PEPCID) 40 MG tablet Take 20 mg by mouth 2 (two) times a day. 01/01/19  Yes [provider]  ferrous sulfate 325 (65 FE) MG tablet Take 325 mg by mouth daily with breakfast. 10/02/18  Yes [provider]  furosemide (LASIX) 40 MG tablet Take 1 tablet (40 mg total) by mouth at bedtime. 02/03/16 01/13/19 Yes Schaevitz, Myra Rudeavid Matthew, MD  hydrALAZINE (APRESOLINE) 50 MG tablet Take 1 tablet (50 mg total) by mouth every 6 (six) hours. 03/22/16  Yes Enid BaasKalisetti, Radhika, MD  isosorbide mononitrate (IMDUR) 30 MG 24 hr tablet Take 30 mg by mouth daily. 07/22/18 07/22/19 Yes [provider]  levothyroxine (SYNTHROID) 50 MCG tablet Take 50 mcg by mouth daily before breakfast. Take on an empty stomach with a glass of water at least 30 to 60 minutes before breakfast 08/07/18  Yes [provider]  losartan (COZAAR) 100 MG tablet Take 1 tablet (100 mg total) by  mouth daily. Patient taking differently: Take 100 mg by mouth every morning.  02/22/16  Yes Houston Siren, MD  Magnesium 200 MG TABS Take 200 mg by mouth 2 (two) times daily.   Yes [provider]  metoprolol tartrate (LOPRESSOR) 25 MG tablet Take 25 mg by mouth 2 (two) times a day. 08/07/18  Yes [provider]  Potassium Chloride ER 20 MEQ TBCR Take 20 mEq by mouth every morning.    Yes [provider]  simvastatin (ZOCOR) 10 MG tablet Take 1 tablet  (10 mg total) by mouth daily at 6 PM. 05/29/15  Yes Altamese Dilling, MD  traMADol (ULTRAM) 50 MG tablet Take 50 mg by mouth 3 (three) times daily as needed for moderate pain.   Yes [provider]  vitamin B-12 (CYANOCOBALAMIN) 1000 MCG tablet Take 1,000 mcg by mouth every morning.    Yes [provider]  clotrimazole-betamethasone (LOTRISONE) cream Apply 1 application topically 2 (two) times daily as needed.     [provider]  mupirocin cream (BACTROBAN) 2 % Apply 1 application topically 2 (two) times daily. 05/12/16   Felecia Shelling, DPM  nitroGLYCERIN (NITROSTAT) 0.4 MG SL tablet Place 0.4 mg under the tongue every 5 (five) minutes x 3 doses as needed for chest pain.     [provider]    Inpatient Medications:  . aspirin EC  81 mg Oral BH-q7a  . cloNIDine  0.3 mg Oral BID  . clopidogrel  75 mg Oral Q breakfast  . famotidine  20 mg Oral Daily  . feeding supplement (ENSURE ENLIVE)  237 mL Oral STAT  . heparin  3,000 Units Intravenous Once  . hydrALAZINE  50 mg Oral Q6H  . isosorbide mononitrate  30 mg Oral BID  . levothyroxine  25 mcg Oral BH-q7a  . magnesium oxide  200 mg Oral BID  . metoprolol tartrate  25 mg Oral BID  . pantoprazole  40 mg Oral BID  . potassium chloride  20 mEq Oral Daily  . simvastatin  10 mg Oral q1800  . vitamin B-12  1,000 mcg Oral q morning - 10a   . heparin      Allergies:  Allergies  Allergen Reactions  . Benicar [Olmesartan] Other (See Comments)    Reaction:  Unknown   . Cardura [Doxazosin Mesylate] Other (See Comments)    Reaction:  Unknown   . Ciprofloxacin Other (See Comments)    Reaction:  Unknown  . Fosamax [Alendronate Sodium] Other (See Comments)    Reaction:  Unknown  . Lisinopril Other (See Comments)    Reaction:  Unknown   . Norvasc [Amlodipine Besylate] Other (See Comments)    Reaction:  Unknown   . Penicillins Other (See Comments)    Reaction:  Unknown   . Sulfa Antibiotics Other (See  Comments)    Reaction:  Unknown   . Torsemide Anxiety    Social History   Socioeconomic History  . Marital status: Widowed    Spouse name: Not on file  . Number of children: Not on file  . Years of education: Not on file  . Highest education level: Not on file  Occupational History  . Not on file  Social Needs  . Financial resource strain: Not on file  . Food insecurity    Worry: Not on file    Inability: Not on file  . Transportation needs    Medical: Not on file    Non-medical: Not on file  Tobacco Use  . Smoking status: Never Smoker  . Smokeless tobacco: Never Used  Substance and Sexual Activity  . Alcohol use: No  . Drug use: No  . Sexual activity: Never  Lifestyle  . Physical activity    Days per week: Not on file    Minutes per session: Not on file  . Stress: Not on file  Relationships  . Social Herbalist on phone: Not on file    Gets together: Not on file    Attends religious service: Not on file    Active member of club or organization: Not on file    Attends meetings of clubs or organizations: Not on file    Relationship status: Not on file  . Intimate partner violence    Fear of current or ex partner: Not on file    Emotionally abused: Not on file    Physically abused: Not on file    Forced sexual activity: Not on file  Other Topics Concern  . Not on file  Social History Narrative  . Not on file     Family History  Problem Relation Age of Onset  . Congestive Heart Failure Father   . CAD Brother      Review of Systems Positive for chest pain Negative for: General:  chills, fever, night sweats or weight changes.  Cardiovascular: PND orthopnea syncope dizziness  Dermatological skin lesions rashes Respiratory: Cough congestion Urologic: Frequent urination urination at night and hematuria Abdominal: negative for nausea, vomiting, diarrhea, bright red blood per rectum, melena, or hematemesis Neurologic: negative for visual changes,  and/or hearing changes  All other systems reviewed and are otherwise negative except as noted above.  Labs: No results for input(s): CKTOTAL, CKMB, TROPONINI in the last 72 hours. Lab Results  Component Value Date   WBC 12.8 (H) 01/13/2019   HGB 13.8 01/13/2019   HCT 43.5 01/13/2019   MCV 83.3 01/13/2019   PLT 320 01/13/2019    Recent Labs  Lab 01/13/19 1544  NA 140  K 3.5  CL 101  CO2 30  BUN 29*  CREATININE 1.24*  CALCIUM 9.2  GLUCOSE 97   Lab Results  Component Value Date   CHOL 120 01/13/2019   HDL 44 01/13/2019   LDLCALC 59 01/13/2019   TRIG 87 01/13/2019   No results found for: DDIMER  Radiology/Studies:  Ct Angio Chest Aorta W And/or Wo Contrast  Result Date: 01/13/2019 CLINICAL DATA:  85 year old female with chest pain. Concern for aortic dissection. EXAM: CT ANGIOGRAPHY CHEST WITH CONTRAST TECHNIQUE: Multidetector CT imaging of the chest was performed using the standard protocol during bolus administration of intravenous contrast. Multiplanar CT image reconstructions and MIPs were obtained to evaluate the vascular anatomy. CONTRAST:  49mL OMNIPAQUE IOHEXOL 350 MG/ML SOLN COMPARISON:  Chest radiograph dated 12/25/2017 and CT dated 15-Dec-202017 FINDINGS: Evaluation of this exam is limited due to respiratory motion artifact. Cardiovascular: There is moderate cardiomegaly. No pericardial effusion. Coronary vascular calcification and postsurgical changes of CABG. There is moderate atherosclerotic calcification of the thoracic aorta. No aneurysmal dilatation or evidence of dissection. The origins of the great vessels of the aortic arch appear patent as visualized. Evaluation of the pulmonary arteries is limited due to suboptimal opacification and timing of the contrast. The central pulmonary arteries appear patent. There is dilatation of the main pulmonary trunk and central pulmonary arteries suggestive of a degree pulmonary hypertension. Mediastinum/Nodes: No hilar or  mediastinal adenopathy. The esophagus is grossly unremarkable. No  mediastinal fluid collection. Lungs/Pleura: There is a faint 10 x 8 mm ground-glass nodule in the right upper lobe without significant interval change since 2017. Minimal bibasilar atelectatic changes noted. There is no focal consolidation, pleural effusion, or pneumothorax. The central airways are patent. Upper Abdomen: No acute abnormality. Musculoskeletal: Osteopenia with multilevel degenerative changes and disc desiccation. Median sternotomy wires. No acute osseous pathology. Review of the MIP images confirms the above findings. IMPRESSION: 1. No acute intrathoracic pathology. No CT evidence of aortic dissection or aneurysm. 2. Cardiomegaly with evidence of a degree of pulmonary hypertension. 3. A faint ground-glass nodule in the right upper lobe stable since 2017. No follow-up recommended. This recommendation follows the consensus statement: Guidelines for Management of Incidental Pulmonary Nodules Detected on CT Images: From the Fleischner Society 2017; Radiology 2017; 284:228-243. 4.  Aortic Atherosclerosis (ICD10-I70.0). Electronically Signed   By: Elgie CollardArash  Radparvar M.D.   On: 01/13/2019 16:51    EKG: Normal sinus rhythm with first-degree AV block and left bundle branch block  Weights: Filed Weights   01/13/19 1511  Weight: 54.4 kg     Physical Exam: Blood pressure (!) 148/69, pulse (!) 57, temperature 98.2 F (36.8 C), temperature source Oral, resp. rate 16, height 5\' 2"  (1.575 m), weight 54.4 kg, SpO2 95 %. Body mass index is 21.95 kg/m. General: Well developed, well nourished, in no acute distress. Head eyes ears nose throat: Normocephalic, atraumatic, sclera non-icteric, no xanthomas, nares are without discharge. No apparent thyromegaly and/or mass  Lungs: Normal respiratory effort.  no wheezes, no rales, no rhonchi.  Heart: RRR with normal S1 S2. no murmur gallop, no rub, PMI is normal size and placement, carotid  upstroke normal without bruit, jugular venous pressure is normal Abdomen: Soft, non-tender, non-distended with normoactive bowel sounds. No hepatomegaly. No rebound/guarding. No obvious abdominal masses. Abdominal aorta is normal size without bruit Extremities: Trace edema. no cyanosis, no clubbing, no ulcers  Peripheral : 2+ bilateral upper extremity pulses, 2+ bilateral femoral pulses, 2+ bilateral dorsal pedal pulse Neuro: Alert and oriented. No facial asymmetry. No focal deficit. Moves all extremities spontaneously. Musculoskeletal: Normal muscle tone with  kyphosis Psych:  Responds to questions appropriately with a normal affect.    Assessment: 85 year old female with known coronary disease status post coronary bypass graft hypertension hyperlipidemia on appropriate medications having a non-ST elevation myocardial infarction without evidence of congestive heart failure improved with oral medications  Plan: 1.  Continue heparin for further risk reduction of myocardial infarction over 24 to 48 hours. 2.  Plavix and aspirin for further risk reduction myocardial infarction and continue as able for 1 year after myocardial infarction 3.  Isosorbide for chest discomfort ambulating and following for improvements of symptoms 4.  Continue hypertension medication management including hydralazine clonidine metoprolol 5.  High intensity cholesterol therapy with simvastatin 6.  Begin ambulation and follow for improvements of symptoms and further diagnostic testing and treatment options if necessary although it ambulating well okay for consideration of discharge to home when able  Signed, Lamar BlinksBruce J Johnta Couts M.D. [facility] 01/14/2019, 7:42 AM

## 2019-01-14 NOTE — Progress Notes (Signed)
Patient transferred from bed to chair this morning for breakfast with no complaints of pain or shortness of breath. Will continue to monitor patient.

## 2019-01-14 NOTE — Progress Notes (Addendum)
[facility] [place] NAME: Anita Ward    MR#:  [id number]  DATE OF BIRTH:  [birth year]  SUBJECTIVE:  CHIEF COMPLAINT:   Chief Complaint  Patient presents with   Chest Pain   Patient reports that she is not having any more chest pain.   REVIEW OF SYSTEMS:  Review of Systems  Respiratory: Negative for shortness of breath.   Cardiovascular: Negative for chest pain, palpitations, orthopnea and leg swelling.    DRUG ALLERGIES:   Allergies  Allergen Reactions   Benicar [Olmesartan] Other (See Comments)    Reaction:  Unknown    Cardura [Doxazosin Mesylate] Other (See Comments)    Reaction:  Unknown    Ciprofloxacin Other (See Comments)    Reaction:  Unknown   Fosamax [Alendronate Sodium] Other (See Comments)    Reaction:  Unknown   Lisinopril Other (See Comments)    Reaction:  Unknown    Norvasc [Amlodipine Besylate] Other (See Comments)    Reaction:  Unknown    Penicillins Other (See Comments)    Reaction:  Unknown    Sulfa Antibiotics Other (See Comments)    Reaction:  Unknown    Torsemide Anxiety   VITALS:  Blood pressure (!) 148/69, pulse (!) 57, temperature 98.2 F (36.8 C), temperature source Oral, resp. rate 16, height 5\' 2"  (1.575 m), weight 54.4 kg, SpO2 95 %. PHYSICAL EXAMINATION:  Physical Exam Vitals signs reviewed.  Constitutional:      Appearance: She is well-developed.  HENT:     Head: Normocephalic and atraumatic.  Pulmonary:     Effort: Pulmonary effort is normal.  Musculoskeletal:     Right lower leg: No edema.     Left lower leg: No edema.  Skin:    General: Skin is warm.  Neurological:     General: No focal deficit present.     Mental Status: She is alert.    LABORATORY PANEL:  Female CBC Recent Labs  Lab 01/13/19 1518  WBC 12.8*  HGB 13.8  HCT 43.5  PLT 320    ------------------------------------------------------------------------------------------------------------------ Chemistries  Recent Labs  Lab 01/13/19 1544  NA 140  K 3.5  CL 101  CO2 30  GLUCOSE 97  BUN 29*  CREATININE 1.24*  CALCIUM 9.2   RADIOLOGY:  Ct Angio Chest Aorta W And/or Wo Contrast  Result Date: 01/13/2019 CLINICAL DATA:  85 year old female with chest pain. Concern for aortic dissection. EXAM: CT ANGIOGRAPHY CHEST WITH CONTRAST TECHNIQUE: Multidetector CT imaging of the chest was performed using the standard protocol during bolus administration of intravenous contrast. Multiplanar CT image reconstructions and MIPs were obtained to evaluate the vascular anatomy. CONTRAST:  24mL OMNIPAQUE IOHEXOL 350 MG/ML SOLN COMPARISON:  Chest radiograph dated 12/25/2017 and CT dated 03/20/2016 FINDINGS: Evaluation of this exam is limited due to respiratory motion artifact. Cardiovascular: There is moderate cardiomegaly. No pericardial effusion. Coronary vascular calcification and postsurgical changes of CABG. There is moderate atherosclerotic calcification of the thoracic aorta. No aneurysmal dilatation or evidence of dissection. The origins of the great vessels of the aortic arch appear patent as visualized. Evaluation of the pulmonary arteries is limited due to suboptimal opacification and timing of the contrast. The central pulmonary arteries appear patent. There is dilatation of the main pulmonary trunk and central pulmonary arteries suggestive of a degree pulmonary hypertension. Mediastinum/Nodes: No hilar or mediastinal adenopathy. The esophagus is grossly unremarkable. No mediastinal fluid collection. Lungs/Pleura: There is a faint 10 x 8 mm  ground-glass nodule in the right upper lobe without significant interval change since 2017. Minimal bibasilar atelectatic changes noted. There is no focal consolidation, pleural effusion, or pneumothorax. The central airways are patent. Upper Abdomen:  No acute abnormality. Musculoskeletal: Osteopenia with multilevel degenerative changes and disc desiccation. Median sternotomy wires. No acute osseous pathology. Review of the MIP images confirms the above findings. IMPRESSION: 1. No acute intrathoracic pathology. No CT evidence of aortic dissection or aneurysm. 2. Cardiomegaly with evidence of a degree of pulmonary hypertension. 3. A faint ground-glass nodule in the right upper lobe stable since 2017. No follow-up recommended. This recommendation follows the consensus statement: Guidelines for Management of Incidental Pulmonary Nodules Detected on CT Images: From the Fleischner Society 2017; Radiology 2017; 284:228-243. 4.  Aortic Atherosclerosis (ICD10-I70.0). Electronically Signed   By: Elgie Collard M.D.   On: 01/13/2019 16:51   ASSESSMENT AND PLAN:  Anita Ward  is a 84 y.o. female with a known history of CAD and mitral valve insufficiency followed by Dr. Gwen Pounds.  She also has a history of hypertension, COPD, GERD, hyperlipidemia, CHF, and anxiety who presented with chest pain.  1. Chest Pain 2/2 NSTEMI   CAD s/p CABG (2004) - Troponin I (High sensitivity): 19>> 43>> 83> 63 this am - continue telemetry - s/p lovenox, will continue heparin gtt for 24-48 hrs per cardiology recs - Cardiology, Dr. Gwen Pounds, following, appreciate recommendations - continue Aspirin and Plavix, previously on ASA at home alone - continue metoprolol, losartan  2.  Hypertension - continue home medications: clonidine, Imdur, hydralazine and metoprolol  - restart patient's home losartan  3.  Hypothyroidism - TSH wnl - Continue home synthroid  4.  Hyperlipidemia - Lipid panel wnl - start high intensity statin crestor 20mg  from 10mg  simvastatin  5.  GERD - continue pantoprazole   DVT and PPI prophylaxis initiated  All the records are reviewed and case discussed with Care Management/Social Worker. Management plans discussed with the patient, family  and they are in agreement.  CODE STATUS: Full Code  POSSIBLE D/C IN 24-48 HOURS, DEPENDING ON CLINICAL CONDITION.  [place] Bishoy Cupp, DO PGY-3, [facility]  01/14/2019 at 7:40 AM  Between 7am to 6pm - Pager - [phone]  After 6pm go to [email or web address] - Social research officer, government  [facility]  Office  [phone]  CC: Primary care physician; Barbette Reichmann, MD  Note: This dictation was prepared with Dragon dictation along with smaller phrase technology. Any transcriptional errors that result from this process are unintentional.

## 2019-01-14 NOTE — Progress Notes (Signed)
*  PRELIMINARY RESULTS* Echocardiogram 2D Echocardiogram has been performed.  Wallie Char Theadora Noyes 01/14/2019, 9:46 AM

## 2019-01-14 NOTE — Progress Notes (Signed)
NP Levada Dy, made aware of new troponin levels.

## 2019-01-14 NOTE — Progress Notes (Signed)
Grandson Dean Wonder updated over the phone. Explained that patient will not be going home today.

## 2019-01-15 LAB — CBC
HCT: 37.2 % (ref 36.0–46.0)
Hemoglobin: 12.1 g/dL (ref 12.0–15.0)
MCH: 26.7 pg (ref 26.0–34.0)
MCHC: 32.5 g/dL (ref 30.0–36.0)
MCV: 82.1 fL (ref 80.0–100.0)
Platelets: 303 10*3/uL (ref 150–400)
RBC: 4.53 MIL/uL (ref 3.87–5.11)
RDW: 14.2 % (ref 11.5–15.5)
WBC: 12.5 10*3/uL — ABNORMAL HIGH (ref 4.0–10.5)
nRBC: 0 % (ref 0.0–0.2)

## 2019-01-15 LAB — HEPARIN LEVEL (UNFRACTIONATED): Heparin Unfractionated: 0.1 IU/mL — ABNORMAL LOW (ref 0.30–0.70)

## 2019-01-15 LAB — BASIC METABOLIC PANEL
Anion gap: 8 (ref 5–15)
BUN: 24 mg/dL — ABNORMAL HIGH (ref 8–23)
CO2: 26 mmol/L (ref 22–32)
Calcium: 8.7 mg/dL — ABNORMAL LOW (ref 8.9–10.3)
Chloride: 105 mmol/L (ref 98–111)
Creatinine, Ser: 1.07 mg/dL — ABNORMAL HIGH (ref 0.44–1.00)
GFR calc Af Amer: 50 mL/min — ABNORMAL LOW (ref >60)
GFR calc non Af Amer: 43 mL/min — ABNORMAL LOW (ref >60)
Glucose, Bld: 119 mg/dL — ABNORMAL HIGH (ref 70–99)
Potassium: 3.6 mmol/L (ref 3.5–5.1)
Sodium: 139 mmol/L (ref 135–145)

## 2019-01-15 MED ORDER — FAMOTIDINE 10 MG PO TABS: 10.0000 mg | ORAL_TABLET | Freq: Every day | ORAL | 0 refills | Status: DC

## 2019-01-15 MED ORDER — ROSUVASTATIN CALCIUM 20 MG PO TABS: 20.0000 mg | ORAL_TABLET | Freq: Every day | ORAL | 0 refills | Status: AC

## 2019-01-15 MED ORDER — HEPARIN BOLUS VIA INFUSION
1500.0000 [IU] | Freq: Once | INTRAVENOUS | Status: AC
  Administered 2019-01-15: 1500 [IU] via INTRAVENOUS
  Filled 2019-01-15: qty 1500

## 2019-01-15 MED ORDER — LOSARTAN POTASSIUM 100 MG PO TABS: 100.0000 mg | ORAL_TABLET | Freq: Every day | ORAL | 1 refills | Status: AC

## 2019-01-15 NOTE — Discharge Summary (Addendum)
An[facility] Ward at [place] NAME: Anita Ward    MR#:  [id number]  DATE OF BIRTH:  14-Apr-1920  DATE OF ADMISSION:  01/13/2019   ADMITTING PHYSICIAN: Christel Mormon, MD  DATE OF DISCHARGE: No discharge date for patient encounter.  PRIMARY CARE PHYSICIAN: Tracie Harrier, MD   ADMISSION DIAGNOSIS:  Hypertensive urgency [I16.0] Chest pain, moderate coronary artery risk [R07.9] DISCHARGE DIAGNOSIS:  Active Problems:   Chest pain   NSTEMI (non-ST elevated myocardial infarction) ([place])  SECONDARY DIAGNOSIS:   Past Medical History:  Diagnosis Date  . Anxiety   . Congestive heart failure (CHF) ([place])   . Coronary artery disease   . Glaucoma   . HTN (hypertension)   . Osteoporosis    HOSPITAL COURSE:  History of presenting illness: Anita Ward  is a 85 y.o. female with a known history of CAD and mitral valve insufficiency, hypertension, COPD, GERD, hyperlipidemia, CHF, and anxiety. She presented to the ED complaining of midsternal chest pain. CXR in ED demonstrated no acute disease. High-sensitivity troponin was 19. Patient was admitted to the hospitalist service for chest pain observation.   Hospital course: 1. NSTEMI  CAD s/p CABG (2004): Cardiology consulted. Troponin I (High sensitivity): 19>> 43>> 83> 63. Heparin gtt continued after lovenox load for 24 hours for further risk reduction of myocardial infarction per cardiology. Plavix and aspirin to be continued for further risk reduction myocardial infarction and continue as able for 1 year after myocardial infarction. Isosorbide for chest discomfort ambulating and following for improvements of symptoms. Patient will need to follow up with cardiology.   2. Hypertension: continued homemedications:clonidine, Imdur, hydralazineand metoprolol, losartan. Patient noted to have elevated pressures in the am and later in the day. Given that patient has higher bp at night, may benefit from nighttime  dosing of losartan.  3. Hypothyroidism. TSH wnl. Continued home synthroid  4. Hyperlipidemia: Lipid panelwnl. Changed patient to high intensity statin crestor 20mg  from zocor 10mg  per cardiology recommendations.   5.GERD: Continued pantoprazole   Patient worked with PT while admitted who recommended 24 hour supervision given acute weakness after being admitted, as well as home health PT. Home health orders placed or PT, RN, Aide to have more assistance int he patient's home. Patient's son reports that family lives next door and they are constantly in and out, and they are able to give her full support for the next week after being discharged.   Feel patient is stable for discharge with cardiology and primary care follow up with home health.  DISCHARGE CONDITIONS:  stable CONSULTS OBTAINED:   DRUG ALLERGIES:   Allergies  Allergen Reactions  . Benicar [Olmesartan] Other (See Comments)    Reaction:  Unknown   . Cardura [Doxazosin Mesylate] Other (See Comments)    Reaction:  Unknown   . Ciprofloxacin Other (See Comments)    Reaction:  Unknown  . Fosamax [Alendronate Sodium] Other (See Comments)    Reaction:  Unknown  . Lisinopril Other (See Comments)    Reaction:  Unknown   . Norvasc [Amlodipine Besylate] Other (See Comments)    Reaction:  Unknown   . Penicillins Other (See Comments)    Reaction:  Unknown   . Sulfa Antibiotics Other (See Comments)    Reaction:  Unknown   . Torsemide Anxiety   DISCHARGE MEDICATIONS:   Allergies as of 01/15/2019      Reactions   Benicar [olmesartan] Other (See Comments)   Reaction:  Unknown  Cardura [doxazosin Mesylate] Other (See Comments)   Reaction:  Unknown    Ciprofloxacin Other (See Comments)   Reaction:  Unknown   Fosamax [alendronate Sodium] Other (See Comments)   Reaction:  Unknown   Lisinopril Other (See Comments)   Reaction:  Unknown    Norvasc [amlodipine Besylate] Other (See Comments)   Reaction:  Unknown     Penicillins Other (See Comments)   Reaction:  Unknown    Sulfa Antibiotics Other (See Comments)   Reaction:  Unknown    Torsemide Anxiety      Medication List    STOP taking these medications   simvastatin 10 MG tablet Commonly known as: ZOCOR     TAKE these medications   ALPRAZolam 0.25 MG tablet Commonly known as: XANAX Take 0.25 mg by mouth 4 (four) times daily.   aspirin EC 81 MG tablet Take 81 mg by mouth daily.   Calcium 600+D 600-400 MG-UNIT tablet Generic drug: Calcium Carbonate-Vitamin D Take 1 tablet by mouth 2 (two) times daily.   cholecalciferol 1000 units tablet Commonly known as: VITAMIN D Take 1,000 Units by mouth every morning.   cloNIDine 0.3 MG tablet Commonly known as: CATAPRES Take 1 tablet (0.3 mg total) by mouth 2 (two) times daily.   clopidogrel 75 MG tablet Commonly known as: PLAVIX Take 1 tablet (75 mg total) by mouth daily with breakfast.   clotrimazole-betamethasone cream Commonly known as: LOTRISONE Apply 1 application topically 2 (two) times daily as needed.   CRANBERRY PO Take 1 capsule by mouth every morning.   famotidine 10 MG tablet Commonly known as: PEPCID Take 1 tablet (10 mg total) by mouth daily. Start taking on: January 16, 2019 What changed:   medication strength  how much to take  when to take this  Another medication with the same name was removed. Continue taking this medication, and follow the directions you see here.   ferrous sulfate 325 (65 FE) MG tablet Take 325 mg by mouth daily with breakfast.   furosemide 40 MG tablet Commonly known as: Lasix Take 1 tablet (40 mg total) by mouth at bedtime.   hydrALAZINE 50 MG tablet Commonly known as: APRESOLINE Take 1 tablet (50 mg total) by mouth every 6 (six) hours.   isosorbide mononitrate 30 MG 24 hr tablet Commonly known as: IMDUR Take 30 mg by mouth daily.   levothyroxine 50 MCG tablet Commonly known as: SYNTHROID Take 50 mcg by mouth daily before  breakfast. Take on an empty stomach with a glass of water at least 30 to 60 minutes before breakfast   losartan 100 MG tablet Commonly known as: COZAAR Take 1 tablet (100 mg total) by mouth at bedtime. What changed: when to take this   Magnesium 200 MG Tabs Take 200 mg by mouth 2 (two) times daily.   metoprolol tartrate 25 MG tablet Commonly known as: LOPRESSOR Take 25 mg by mouth 2 (two) times a day.   mupirocin cream 2 % Commonly known as: Bactroban Apply 1 application topically 2 (two) times daily.   nitroGLYCERIN 0.4 MG SL tablet Commonly known as: NITROSTAT Place 0.4 mg under the tongue every 5 (five) minutes x 3 doses as needed for chest pain.   Potassium Chloride ER 20 MEQ Tbcr Take 20 mEq by mouth every morning.   rosuvastatin 20 MG tablet Commonly known as: CRESTOR Take 1 tablet (20 mg total) by mouth daily at 6 PM.   traMADol 50 MG tablet Commonly known as: ULTRAM Take 50  mg by mouth 3 (three) times daily as needed for moderate pain.   vitamin B-12 1000 MCG tablet Commonly known as: CYANOCOBALAMIN Take 1,000 mcg by mouth every morning.       DISCHARGE INSTRUCTIONS:   DIET:  Cardiac diet DISCHARGE CONDITION:  Stable ACTIVITY:  Activity as tolerated OXYGEN:  Home Oxygen: No.  Oxygen Delivery: n/a DISCHARGE LOCATION:  home   If you experience worsening of your admission symptoms, develop shortness of breath, life threatening emergency, suicidal or homicidal thoughts you must seek medical attention immediately by calling 911 or calling your MD immediately  if symptoms less severe.  You Must read complete instructions/literature along with all the possible adverse reactions/side effects for all the Medicines you take and that have been prescribed to you. Take any new Medicines after you have completely understood and accpet all the possible adverse reactions/side effects.   Please note  You were cared for by a hospitalist during your hospital stay. If  you have any questions about your discharge medications or the care you received while you were in the hospital after you are discharged, you can call the unit and asked to speak with the hospitalist on call if the hospitalist that took care of you is not available. Once you are discharged, your primary care physician will handle any further medical issues. Please note that NO REFILLS for any discharge medications will be authorized once you are discharged, as it is imperative that you return to your primary care physician (or establish a relationship with a primary care physician if you do not have one) for your aftercare needs so that they can reassess your need for medications and monitor your lab values.    On the day of Discharge:  VITAL SIGNS:  Blood pressure (!) 160/55, pulse (!) 55, temperature 98 F (36.7 C), temperature source Oral, resp. rate 19, height 5\' 2"  (1.575 m), weight 54.4 kg, SpO2 97 %. PHYSICAL EXAMINATION:  GENERAL:  85 y.o.-year-old patient lying in the bed with no acute distress.  HEENT: Head atraumatic, normocephalic.  NECK:  Supple LUNGS: No use of accessory muscles of respiration.  CARDIOVASCULAR: S1, S2 normal. No murmurs, rubs, or gallops.  ABDOMEN: Soft, non-tender, non-distended.  EXTREMITIES: No pedal edema, cyanosis, or clubbing.  NEUROLOGIC: no focal deficit noted PSYCHIATRIC: The patient is alert SKIN: No obvious rash, lesion, or ulcer.  DATA REVIEW:   CBC Recent Labs  Lab 01/15/19 0509  WBC 12.5*  HGB 12.1  HCT 37.2  PLT 303    Chemistries  Recent Labs  Lab 01/15/19 0509  NA 139  K 3.6  CL 105  CO2 26  GLUCOSE 119*  BUN 24*  CREATININE 1.07*  CALCIUM 8.7*     Microbiology Results  Results for orders placed or performed during the hospital encounter of 01/13/19  SARS Coronavirus 2 (CEPHEID - [facility]), Hosp Order     Status: None   Collection Time: 01/13/19  6:03 PM   Specimen: Nasopharyngeal Swab   Result Value Ref Range Status   SARS Coronavirus 2 NEGATIVE NEGATIVE Final    Comment: (NOTE) If result is NEGATIVE SARS-CoV-2 target nucleic acids are NOT DETECTED. The SARS-CoV-2 RNA is generally detectable in upper and lower  respiratory specimens during the acute phase of infection. The lowest  concentration of SARS-CoV-2 viral copies this assay can detect is 250  copies / mL. A negative result does not preclude SARS-CoV-2 infection  and should not be used as  the sole basis for treatment or other  patient management decisions.  A negative result may occur with  improper specimen collection / handling, submission of specimen other  than nasopharyngeal swab, presence of viral mutation(s) within the  areas targeted by this assay, and inadequate number of viral copies  (<250 copies / mL). A negative result must be combined with clinical  observations, patient history, and epidemiological information. If result is POSITIVE SARS-CoV-2 target nucleic acids are DETECTED. The SARS-CoV-2 RNA is generally detectable in upper and lower  respiratory specimens dur ing the acute phase of infection.  Positive  results are indicative of active infection with SARS-CoV-2.  Clinical  correlation with patient history and other diagnostic information is  necessary to determine patient infection status.  Positive results do  not rule out bacterial infection or co-infection with other viruses. If result is PRESUMPTIVE POSTIVE SARS-CoV-2 nucleic acids MAY BE PRESENT.   A presumptive positive result was obtained on the submitted specimen  and confirmed on repeat testing.  While 2019 novel coronavirus  (SARS-CoV-2) nucleic acids may be present in the submitted sample  additional confirmatory testing may be necessary for epidemiological  and / or clinical management purposes  to differentiate between  SARS-CoV-2 and other Sarbecovirus currently known to infect humans.  If clinically indicated additional  testing with an alternate test  methodology [phone]) is advised. The SARS-CoV-2 RNA is generally  detectable in upper and lower respiratory sp ecimens during the acute  phase of infection. The expected result is Negative. Fact Sheet for Patients:  BoilerBrush.com.cy Fact Sheet for Healthcare Providers: https://pope.com/ This test is not yet approved or cleared by the [place] FDA and has been authorized for detection and/or diagnosis of SARS-CoV-2 by FDA under an Emergency Use Authorization (EUA).  This EUA will remain in effect (meaning this test can be used) for the duration of the COVID-19 declaration under Section 564(b)(1) of the Act, 21 U.S.C. section 360bbb-3(b)(1), unless the authorization is terminated or revoked sooner. Performed at [facility], [address] [place], [place] [address]     RADIOLOGY:  No results found.  Management plans discussed with the patient, family and they are in agreement.  CODE STATUS: Full Code   [place] Munira Polson, DO PGY-3, [facility]  01/15/2019 at 12:58 PM  Between 7am to 6pm - Pager - [phone]  After 6pm go to [email or web address] - Social research officer, government  [facility]  Office  [phone]  CC: Primary care physician; Barbette Reichmann, MD   Note: This dictation was prepared with Dragon dictation along with smaller phrase technology. Any transcriptional errors that result from this process are unintentional.

## 2019-01-15 NOTE — Progress Notes (Signed)
[facility] Encounter Note  Patient: Anita Ward / Admit Date: 01/13/2019 / Date of Encounter: 01/15/2019, 9:33 AM   Subjective: Patient has had no evidence of significant chest discomfort or shortness of breath since admission.  Patient has minimal ambulation but no evidence of symptoms during this time.  Peak troponin was 83 possibly consistent with non-ST elevation myocardial infarction versus demand ischemia although with chest pain consistent with myocardial infarction.  Patient tolerated heparin for 48 hours.  No other new EKG changes although previously abnormal EKG with left bundle branch block  Review of Systems: Positive for: None Negative for: Vision change, hearing change, syncope, dizziness, nausea, vomiting,diarrhea, bloody stool, stomach pain, cough, congestion, diaphoresis, urinary frequency, urinary pain,skin lesions, skin rashes Others previously listed  Objective: Telemetry: Normal sinus rhythm with bundle branch block Physical Exam: Blood pressure (!) 195/74, pulse 86, temperature 98.6 F (37 C), temperature source Oral, resp. rate 19, height 5\' 2"  (1.575 m), weight 54.4 kg, SpO2 94 %. Body mass index is 21.94 kg/m. General: Well developed, well nourished, in no acute distress. Head: Normocephalic, atraumatic, sclera non-icteric, no xanthomas, nares are without discharge. Neck: No apparent masses Lungs: Normal respirations with no wheezes, few rhonchi, no rales , no crackles   Heart: Regular rate and rhythm, normal S1 S2, left sternal border murmur, no rub, no gallop, PMI is normal size and placement, carotid upstroke normal without bruit, jugular venous pressure normal Abdomen: Soft, non-tender, non-distended with normoactive bowel sounds. No hepatosplenomegaly. Abdominal aorta is normal size without bruit Extremities: Trace edema, no clubbing, no cyanosis, no ulcers,  Peripheral: 2+ radial, 2+ femoral, 2+ dorsal pedal pulses Neuro: Alert and  oriented. Moves all extremities spontaneously. Psych:  Responds to questions appropriately with a normal affect.   Intake/Output Summary (Last 24 hours) at 01/15/2019 0933 Last data filed at 01/15/2019 0846 Gross per 24 hour  Intake -  Output 2600 ml  Net -2600 ml    Inpatient Medications:  . aspirin EC  81 mg Oral BH-q7a  . cloNIDine  0.3 mg Oral BID  . clopidogrel  75 mg Oral Q breakfast  . famotidine  10 mg Oral Daily  . hydrALAZINE  50 mg Oral Q6H  . isosorbide mononitrate  30 mg Oral BID  . levothyroxine  25 mcg Oral BH-q7a  . losartan  100 mg Oral Daily  . magnesium oxide  200 mg Oral BID  . metoprolol tartrate  25 mg Oral BID  . pantoprazole  40 mg Oral BID  . potassium chloride  20 mEq Oral Daily  . rosuvastatin  20 mg Oral q1800  . vitamin B-12  1,000 mcg Oral q morning - 10a   Infusions:  . heparin 750 Units/hr (01/15/19 2778)    Labs: Recent Labs    01/13/19 1544 01/15/19 0509  NA 140 139  K 3.5 3.6  CL 101 105  CO2 30 26  GLUCOSE 97 119*  BUN 29* 24*  CREATININE 1.24* 1.07*  CALCIUM 9.2 8.7*   No results for input(s): AST, ALT, ALKPHOS, BILITOT, PROT, ALBUMIN in the last 72 hours. Recent Labs    01/14/19 1158 01/15/19 0509  WBC 13.5* 12.5*  HGB 12.9 12.1  HCT 39.7 37.2  MCV 81.9 82.1  PLT 325 303   No results for input(s): CKTOTAL, CKMB, TROPONINI in the last 72 hours. Invalid input(s): POCBNP No results for input(s): HGBA1C in the last 72 hours.   Weights: Filed Weights   01/13/19 1511 01/15/19 0436  Weight: 54.4 kg 54.4 kg     Radiology/Studies:  Ct Angio Chest Aorta W And/or Wo Contrast  Result Date: 01/13/2019 CLINICAL DATA:  85 year old female with chest pain. Concern for aortic dissection. EXAM: CT ANGIOGRAPHY CHEST WITH CONTRAST TECHNIQUE: Multidetector CT imaging of the chest was performed using the standard protocol during bolus administration of intravenous contrast. Multiplanar CT image reconstructions and MIPs were obtained to  evaluate the vascular anatomy. CONTRAST:  47mL OMNIPAQUE IOHEXOL 350 MG/ML SOLN COMPARISON:  Chest radiograph dated 12/25/2017 and CT dated 03/20/2016 FINDINGS: Evaluation of this exam is limited due to respiratory motion artifact. Cardiovascular: There is moderate cardiomegaly. No pericardial effusion. Coronary vascular calcification and postsurgical changes of CABG. There is moderate atherosclerotic calcification of the thoracic aorta. No aneurysmal dilatation or evidence of dissection. The origins of the great vessels of the aortic arch appear patent as visualized. Evaluation of the pulmonary arteries is limited due to suboptimal opacification and timing of the contrast. The central pulmonary arteries appear patent. There is dilatation of the main pulmonary trunk and central pulmonary arteries suggestive of a degree pulmonary hypertension. Mediastinum/Nodes: No hilar or mediastinal adenopathy. The esophagus is grossly unremarkable. No mediastinal fluid collection. Lungs/Pleura: There is a faint 10 x 8 mm ground-glass nodule in the right upper lobe without significant interval change since 2017. Minimal bibasilar atelectatic changes noted. There is no focal consolidation, pleural effusion, or pneumothorax. The central airways are patent. Upper Abdomen: No acute abnormality. Musculoskeletal: Osteopenia with multilevel degenerative changes and disc desiccation. Median sternotomy wires. No acute osseous pathology. Review of the MIP images confirms the above findings. IMPRESSION: 1. No acute intrathoracic pathology. No CT evidence of aortic dissection or aneurysm. 2. Cardiomegaly with evidence of a degree of pulmonary hypertension. 3. A faint ground-glass nodule in the right upper lobe stable since 2017. No follow-up recommended. This recommendation follows the consensus statement: Guidelines for Management of Incidental Pulmonary Nodules Detected on CT Images: From the Fleischner Society 2017; Radiology 2017;  284:[id number]. 4.  Aortic Atherosclerosis (ICD10-I70.0). Electronically Signed   By: Elgie Collard M.D.   On: 01/13/2019 16:51     Assessment and Recommendation  85 y.o. female with known cardiovascular disease status post coronary to bypass graft essential hypertension mixed hyperlipidemia previously on appropriate medication management having chest discomfort and likely minimal non-ST elevation myocardial infarction now on appropriate medication management for treatment without further symptoms 1.  Continue medication management including dual antiplatelet therapy for non-ST elevation myocardial infarction 2.  Continue medication management for hypertension control without change 3.  Isosorbide for chest pain 4.  Begin ambulation and follow for worsening symptoms and need for adjustments of medication or for further intervention depending on symptoms.  If patient symptoms stable would discharged home from cardiac standpoint with follow-up later this week and further adjustments as necessary  Signed, Arnoldo Hooker M.D. FACC

## 2019-01-15 NOTE — Progress Notes (Signed)
PT Cancellation Note  Patient Details Name: GENENE KILMAN MRN: [id number] DOB: May 17, 1920   Cancelled Treatment:    Reason Eval/Treat Not Completed: Medical issues which prohibited therapy;Other (comment)(CHart reviewed, RN consulted. BP high this AM, rechecked by RN after meds, remains high. In context of age and acute NSTEMI, will hold until BP better controlled.)  11:05 AM, 01/15/19 Etta Grandchild, PT, DPT Physical Therapist - [facility]  [phone] ([place])    [place] C 01/15/2019, 11:05 AM

## 2019-01-15 NOTE — Progress Notes (Signed)
Blood pressure gone over with Dr.Mayo, verbalized the numbers were stable for patient to go home. Grandson called for update. Discharge paperwork gone over. IV taken out and tele monitor off. Grandson picked up patient. All belongings sent home with patient.

## 2019-01-15 NOTE — Progress Notes (Signed)
[place] [facility] [place] NAME: Anita Ward    MR#:  [id number]  DATE OF BIRTH:  16-Feb-1920  SUBJECTIVE:  CHIEF COMPLAINT:   Chief Complaint  Patient presents with  . Chest Pain   Patient ready to go home this am, has no complaints.  REVIEW OF SYSTEMS:  Review of Systems  Respiratory: Negative for shortness of breath.   Cardiovascular: Negative for chest pain.    DRUG ALLERGIES:   Allergies  Allergen Reactions  . Benicar [Olmesartan] Other (See Comments)    Reaction:  Unknown   . Cardura [Doxazosin Mesylate] Other (See Comments)    Reaction:  Unknown   . Ciprofloxacin Other (See Comments)    Reaction:  Unknown  . Fosamax [Alendronate Sodium] Other (See Comments)    Reaction:  Unknown  . Lisinopril Other (See Comments)    Reaction:  Unknown   . Norvasc [Amlodipine Besylate] Other (See Comments)    Reaction:  Unknown   . Penicillins Other (See Comments)    Reaction:  Unknown   . Sulfa Antibiotics Other (See Comments)    Reaction:  Unknown   . Torsemide Anxiety   VITALS:  Blood pressure (!) 203/81, pulse 80, temperature 98.8 F (37.1 C), temperature source Oral, resp. rate 20, height 5\' 2"  (1.575 m), weight 54.4 kg, SpO2 97 %. PHYSICAL EXAMINATION:  Physical Exam Vitals signs reviewed.  Constitutional:      Appearance: She is well-developed.  HENT:     Head: Normocephalic and atraumatic.  Pulmonary:     Effort: Pulmonary effort is normal.  Musculoskeletal:     Right lower leg: No edema.     Left lower leg: No edema.  Skin:    General: Skin is warm.  Neurological:     General: No focal deficit present.     Mental Status: She is alert.  Psychiatric:        Mood and Affect: Mood normal.        Behavior: Behavior normal.    LABORATORY PANEL:  Female CBC Recent Labs  Lab 01/15/19 0509  WBC 12.5*  HGB 12.1  HCT 37.2  PLT 303    ------------------------------------------------------------------------------------------------------------------ Chemistries  Recent Labs  Lab 01/15/19 0509  NA 139  K 3.6  CL 105  CO2 26  GLUCOSE 119*  BUN 24*  CREATININE 1.07*  CALCIUM 8.7*   RADIOLOGY:  No results found. ASSESSMENT AND PLAN:  DorothyKingis a 85 y.o.femalewith a past medical history of CAD and mitral valve insufficiency followed by Dr. Nehemiah Massed. She also has a history of hypertension, COPD, GERD, hyperlipidemia, CHF, and anxiety who presented with chest pain.  1.NSTEMI  CAD s/p CABG (2004) -Troponin I (High sensitivity): 19>> 43>> 83> 63 - may discontinue heparin gtt  -Cardiology, Dr. Nehemiah Massed, following, appreciate recommendations - continue Aspirin and Plavix - continue metoprolol, losartan  2. Hypertension - continue home medications: clonidine, Imdur, hydralazine and metoprolol, losartan - given that patient has higher bp at night, may benefit from nighttime dosing of losartan  3. Hypothyroidism - TSH wnl - Continue home synthroid  4. Hyperlipidemia -Lipid panel wnl - continue high intensity statin crestor 20mg    5.GERD -continue pantoprazole   DVT and PPI prophylaxis initiated  All the records are reviewed and case discussed with Care Management/Social Worker. Management plans discussed with the patient, family and they are in agreement.  CODE STATUS: Full Code  POSSIBLE D/C TODAY, DEPENDING ON CLINICAL CONDITION.  Anita Shrihan Putt, DO PGY-3,  [facility]  01/15/2019 at 7:30 AM  Between 7am to 6pm - Pager - [phone]  After 6pm go to [email or web address] - Social research officer, government  [facility]  Office  [phone]  CC: Primary care physician; Barbette Reichmann, MD

## 2019-01-15 NOTE — Discharge Instructions (Signed)
Thank you for allowing [place] to participate in your care!    You were admitted after experiencing chest pain.  While you were admitted you were found to have had evidence of a heart attack or an NSTEMI. You were seen and evaluated by cardiology who did not recommend any invasive treatment.  We have adjusted some of her medications in order to manage this medically to prevent this from happening in the future.  We have increased the potency of your cholesterol medication. - Please stop taking the Zocor and start taking the Crestor daily.  While here, we also noticed that your blood pressure appears to be higher in the mornings.  We recommend that you start taking your losartan the evenings at the same dose.  It is important that you follow-up with your cardiologist regularly.   If you experience worsening of your admission symptoms, develop shortness of breath, life threatening emergency, suicidal or homicidal thoughts you must seek medical attention immediately by calling 911 or calling your MD immediately  if symptoms less severe.

## 2019-01-15 NOTE — Evaluation (Signed)
Physical Therapy Evaluation Patient Details Name: Anita Ward MRN: [id number] DOB: [birth year] Today's Date: 01/15/2019   History of Present Illness  Blaklee Chandley is a 85yo F who [facility] c chest pain, cardiology reporting an acute NSTEMI. PMH: HTNm COPD, GERD, HLD, CHF, GAD.  Clinical Impression  Pt admitted with above diagnosis. Pt currently with functional limitations due to the deficits listed below (see "PT Problem List"). PT evaluation held earlier this date 2/2 uncontrolled HTN, which has since improved. Upon entry, pt in chair, awake and agreeable to participate. Communication is complicated by [facility] and face covering, but essential background information is obtained. The pt is alert and oriented x3, pleasant, conversational, and generally a fair historian, but often tangential, and patient appears to have limited insight into her current medical problems. Pt transfers with supervision and AMB ~18ft reporting gross weakness and hunger that limits additional AMB at this time. Pt has been up AMB with nursing a few times this AM. When additional questions a posed regarding current weakness or limitations, detail in answer is quite limited as patient nearly always transforms any previous complaint back into a different version of hunger. Pt doth attest to superior baseline AMB performance PTA. In current state, pt should have more support in the home until strength improves as she struggles to AMB household distances at this time. It is unclear if this can be fabricated by Shari Heritage and Grandson at DC. Pt will benefit from skilled PT intervention to increase independence and safety with basic mobility in preparation for discharge to the venue listed below.       Follow Up Recommendations Supervision/Assistance - 24 hour;Home health PT;Other (comment)(not moving well enough at this time to be safe for DC to home alone. AMB limited to less than 164ft)    Equipment Recommendations  None  recommended by PT    Recommendations for Other Services       Precautions / Restrictions Precautions Precautions: Fall Precaution Comments: *BP has been high Restrictions Weight Bearing Restrictions: No      Mobility  Bed Mobility                  Transfers Overall transfer level: Needs assistance Equipment used: Rolling walker (2 wheeled);None Transfers: Sit to/from Stand Sit to Stand: Supervision         General transfer comment: use of arms, fair confidence, reports some acute weakness  Ambulation/Gait Ambulation/Gait assistance: Min guard Gait Distance (Feet): 75 Feet Assistive device: Rolling walker (2 wheeled) Gait Pattern/deviations: WFL(Within Functional Limits);Step-to pattern;Decreased step length - right;Decreased step length - left;Trunk flexed;Wide base of support     General Gait Details: subjective acute weakness, refuses additional distance at this time.  Stairs            Wheelchair Mobility    Modified Rankin (Stroke Patients Only)       Balance Overall balance assessment: History of Falls;Needs assistance;Mild deficits observed, not formally tested         Standing balance support: During functional activity Standing balance-Leahy Scale: Fair Standing balance comment: (typically a furniture walker now for several years, but does use RW when out of house or 'when (she) needs it.)                             Pertinent Vitals/Pain      Home Living Family/patient expects to be discharged to:: Private residence Living Arrangements: Alone  Type of Home: House Home Access: Stairs to enter Entrance Stairs-Rails: (hand at doorway) Technical brewer of Steps: 1   Home Equipment: Environmental consultant - 2 wheels;Walker - 4 wheels      Prior Function                 Hand Dominance        Extremity/Trunk Assessment   Upper Extremity Assessment Upper Extremity Assessment: Overall WFL for tasks assessed     Lower Extremity Assessment Lower Extremity Assessment: Overall WFL for tasks assessed    Cervical / Trunk Assessment Cervical / Trunk Assessment: Kyphotic  Communication      Cognition Arousal/Alertness: Awake/alert Behavior During Therapy: WFL for tasks assessed/performed Overall Cognitive Status: Within Functional Limits for tasks assessed                                 General Comments: appears to have some short term memory difficulty, some peritition in conversation, minimal insight into her current condition continually relaying all of her issues back to not eating enough.      General Comments      Exercises     Assessment/Plan    PT Assessment Patient needs continued PT services  PT Problem List Decreased strength;Decreased activity tolerance;Decreased mobility;Decreased safety awareness;Decreased cognition;Decreased knowledge of precautions       PT Treatment Interventions DME instruction;Gait training;Stair training;Functional mobility training;Therapeutic activities;Patient/family education;Therapeutic exercise    PT Goals (Current goals can be found in the Care Plan section)  Acute Rehab PT Goals Patient Stated Goal: regain strength and avoid subsequent event by eating. PT Goal Formulation: With patient Time For Goal Achievement: 01/29/19 Potential to Achieve Goals: Fair    Frequency Min 2X/week   Barriers to discharge Decreased caregiver support lives alone with intermittent family check ins    Co-evaluation               AM-PAC PT "6 Clicks" Mobility  Outcome Measure Help needed turning from your back to your side while in a flat bed without using bedrails?: A Little Help needed moving from lying on your back to sitting on the side of a flat bed without using bedrails?: A Little Help needed moving to and from a bed to a chair (including a wheelchair)?: A Little Help needed standing up from a chair using your arms (e.g.,  wheelchair or bedside chair)?: A Little Help needed to walk in hospital room?: A Little Help needed climbing 3-5 steps with a railing? : A Little 6 Click Score: 18    End of Session Equipment Utilized During Treatment: Gait belt Activity Tolerance: No increased pain;Patient limited by fatigue Patient left: in chair;with chair alarm set;with call bell/phone within reach Nurse Communication: (reports to be hungry) PT Visit Diagnosis: Other abnormalities of gait and mobility (R26.89);Difficulty in walking, not elsewhere classified (R26.2);Muscle weakness (generalized) (M62.81)    Time: 1202-1216 PT Time Calculation (min) (ACUTE ONLY): 14 min   Charges:   PT Evaluation $PT Eval Moderate Complexity: 1 Mod PT Treatments $Therapeutic Exercise: 8-22 mins        12:34 PM, 01/15/19 Etta Grandchild, PT, DPT Physical Therapist - [facility]  [phone] ([place])   [place]er[place] Tr[place] C 01/15/2019, 12:28 PM

## 2019-01-15 NOTE — TOC Transition Note (Signed)
Transition of Care [facility]) - CM/SW Discharge Note   Patient Details  Name: Anita Ward MRN: [id number] Date of Birth: February 18, 1920  Transition of Care [facility] CM/SW Contact:  Shade Flood, LCSW Phone Number: 01/15/2019, 2:27 PM   Clinical Narrative:     Pt stable for dc today per MD and [facility] is recommended. Spoke with pt's son by phone. Pt will be returning to her home where she lives independently. Pt's grandson helps her with her medicine every day. Pt's son shops and cleans for pt. Son agreeable to [facility] for pt. Referral sent to requested provider.   Updated RN. There are no other TOC needs for dc.   Final next level of care: [place] Barriers to Discharge: Barriers Resolved   Patient Goals and CMS Choice Patient states their goals for this hospitalization and ongoing recovery are:: Return home to prior level of function CMS Medicare.gov Compare Post Acute Care list provided to:: Patient Represenative (must comment)(son) Choice offered to / list presented to : Adult Children  Discharge Placement                       Discharge Plan and Services                          HH Arranged: RN, PT, Nurse's Aide [place] Agency: [facility] Date [place]: 01/15/19 Time [place]: [id number] Representative spoke with at [place]: [place] ([place]) Interventions     Readmission Risk Interventions Readmission Risk Prevention Plan 01/15/2019  Transportation Screening Complete  PCP or Specialist Appt within 5-7 Days Complete  Home Care Screening Complete  Medication Review (RN CM) Complete  Some recent data might be hidden

## 2019-01-15 NOTE — Consult Note (Signed)
ANTICOAGULATION CONSULT NOTE - Initial Consult  Pharmacy Consult for heparin dosing  Indication: chest pain/ACS  Allergies  Allergen Reactions  . Benicar [Olmesartan] Other (See Comments)    Reaction:  Unknown   . Cardura [Doxazosin Mesylate] Other (See Comments)    Reaction:  Unknown   . Ciprofloxacin Other (See Comments)    Reaction:  Unknown  . Fosamax [Alendronate Sodium] Other (See Comments)    Reaction:  Unknown  . Lisinopril Other (See Comments)    Reaction:  Unknown   . Norvasc [Amlodipine Besylate] Other (See Comments)    Reaction:  Unknown   . Penicillins Other (See Comments)    Reaction:  Unknown   . Sulfa Antibiotics Other (See Comments)    Reaction:  Unknown   . Torsemide Anxiety   Patient Measurements: Height: 5\' 2"  (157.5 cm) Weight: 119 lb 14.9 oz (54.4 kg) IBW/kg (Calculated) : 50.1 Vital Signs: Temp: 98.8 F (37.1 C) (07/08 0436) Temp Source: Oral (07/08 0436) BP: 203/81 (07/08 0436) Pulse Rate: 80 (07/08 0436)  Labs: Recent Labs    01/13/19 1518  01/13/19 1544 01/13/19 1803 01/13/19 2156 01/14/19 0904 01/14/19 1158 01/15/19 0509  HGB 13.8  --   --   --   --   --  12.9 12.1  HCT 43.5  --   --   --   --   --  39.7 37.2  PLT 320  --   --   --   --   --  325 303  APTT  --   --  30  --   --   --   --   --   LABPROT  --   --  13.1  --   --   --   --   --   INR  --   --  1.0  --   --   --   --   --   HEPARINUNFRC  --   --   --   --   --   --   --  0.10*  CREATININE  --   --  1.24*  --   --   --   --  1.07*  TROPONINIHS  --    < > 19* 43* 83* 63*  --   --    < > = values in this interval not displayed.   Estimated Creatinine Clearance: 23.8 mL/min (A) (by C-G formula based on SCr of 1.07 mg/dL (H)).  Medical History: Past Medical History:  Diagnosis Date  . Anxiety   . Congestive heart failure (CHF) ([place])   . Coronary artery disease   . Glaucoma   . HTN (hypertension)   . Osteoporosis    Assessment: Pharmacy consulted for heparin  infusion dosing and monitoring in 85 yo female admitted with chest pain. Patient received treatment dose enoxaparin 55mg  (1mg /kg)  7/6 @ 2030.   Troponin I (High sensitivity): 19>> 43>> 83  Heparin infusion started 24 hours after treatment dose enoxaparin.  7/7 @ 2100 Heparin infusion started @ 600 units/hr   Goal of Therapy:  APTT:66-102s  Heparin level 0.3-0.7 units/ml Monitor platelets by anticoagulation protocol: Yes   Plan:  7/8 @ 0500 HL: 0.10. Level is subtherapeutic. Will order heparin 1500 unit bolus and increase heparin infusion rate to 750 units/hr.  Recheck anti-Xa level  6 hours  and daily while on heparin Continue to monitor H&H and platelets   Pernell Dupre, PharmD, BCPS Clinical Pharmacist 01/15/2019 6:17  AM

## 2019-02-19 ENCOUNTER — Other Ambulatory Visit: Payer: Self-pay

## 2019-02-19 ENCOUNTER — Emergency Department: Payer: Medicare Other

## 2019-02-19 ENCOUNTER — Observation Stay
Admission: EM | Admit: 2019-02-19 | Discharge: 2019-02-20 | Disposition: A | Discharge status: Home / Self Care | Payer: Medicare Other | Attending: Internal Medicine | Admitting: Internal Medicine

## 2019-02-19 DIAGNOSIS — R262 Difficulty in walking, not elsewhere classified: Secondary | ICD-10-CM | POA: Diagnosis not present

## 2019-02-19 DIAGNOSIS — E785 Hyperlipidemia, unspecified: Secondary | ICD-10-CM | POA: Insufficient documentation

## 2019-02-19 DIAGNOSIS — Z7982 Long term (current) use of aspirin: Secondary | ICD-10-CM | POA: Diagnosis not present

## 2019-02-19 DIAGNOSIS — Z20828 Contact with and (suspected) exposure to other viral communicable diseases: Secondary | ICD-10-CM | POA: Diagnosis not present

## 2019-02-19 DIAGNOSIS — Z7902 Long term (current) use of antithrombotics/antiplatelets: Secondary | ICD-10-CM | POA: Diagnosis not present

## 2019-02-19 DIAGNOSIS — R0789 Other chest pain: Principal | ICD-10-CM | POA: Insufficient documentation

## 2019-02-19 DIAGNOSIS — Z79899 Other long term (current) drug therapy: Secondary | ICD-10-CM | POA: Insufficient documentation

## 2019-02-19 DIAGNOSIS — M810 Age-related osteoporosis without current pathological fracture: Secondary | ICD-10-CM | POA: Diagnosis not present

## 2019-02-19 DIAGNOSIS — M6281 Muscle weakness (generalized): Secondary | ICD-10-CM | POA: Insufficient documentation

## 2019-02-19 DIAGNOSIS — R0602 Shortness of breath: Secondary | ICD-10-CM

## 2019-02-19 DIAGNOSIS — I110 Hypertensive heart disease with heart failure: Secondary | ICD-10-CM | POA: Diagnosis not present

## 2019-02-19 DIAGNOSIS — Z881 Allergy status to other antibiotic agents status: Secondary | ICD-10-CM | POA: Insufficient documentation

## 2019-02-19 DIAGNOSIS — H409 Unspecified glaucoma: Secondary | ICD-10-CM | POA: Insufficient documentation

## 2019-02-19 DIAGNOSIS — Z951 Presence of aortocoronary bypass graft: Secondary | ICD-10-CM | POA: Diagnosis present

## 2019-02-19 DIAGNOSIS — Z888 Allergy status to other drugs, medicaments and biological substances status: Secondary | ICD-10-CM | POA: Diagnosis not present

## 2019-02-19 DIAGNOSIS — Z8249 Family history of ischemic heart disease and other diseases of the circulatory system: Secondary | ICD-10-CM | POA: Diagnosis not present

## 2019-02-19 DIAGNOSIS — Z882 Allergy status to sulfonamides status: Secondary | ICD-10-CM | POA: Insufficient documentation

## 2019-02-19 DIAGNOSIS — E039 Hypothyroidism, unspecified: Secondary | ICD-10-CM | POA: Insufficient documentation

## 2019-02-19 DIAGNOSIS — R079 Chest pain, unspecified: Secondary | ICD-10-CM | POA: Diagnosis present

## 2019-02-19 DIAGNOSIS — Z79890 Hormone replacement therapy (postmenopausal): Secondary | ICD-10-CM | POA: Diagnosis not present

## 2019-02-19 DIAGNOSIS — I252 Old myocardial infarction: Secondary | ICD-10-CM | POA: Insufficient documentation

## 2019-02-19 DIAGNOSIS — F419 Anxiety disorder, unspecified: Secondary | ICD-10-CM | POA: Diagnosis not present

## 2019-02-19 DIAGNOSIS — I2510 Atherosclerotic heart disease of native coronary artery without angina pectoris: Secondary | ICD-10-CM | POA: Diagnosis not present

## 2019-02-19 DIAGNOSIS — Z880 Allergy status to penicillin: Secondary | ICD-10-CM | POA: Diagnosis not present

## 2019-02-19 DIAGNOSIS — I5033 Acute on chronic diastolic (congestive) heart failure: Secondary | ICD-10-CM | POA: Diagnosis not present

## 2019-02-19 LAB — CBC
HCT: 36.7 % (ref 36.0–46.0)
Hemoglobin: 11.7 g/dL — ABNORMAL LOW (ref 12.0–15.0)
MCH: 26.5 pg (ref 26.0–34.0)
MCHC: 31.9 g/dL (ref 30.0–36.0)
MCV: 83.2 fL (ref 80.0–100.0)
Platelets: 261 10*3/uL (ref 150–400)
RBC: 4.41 MIL/uL (ref 3.87–5.11)
RDW: 14.5 % (ref 11.5–15.5)
WBC: 11.3 10*3/uL — ABNORMAL HIGH (ref 4.0–10.5)
nRBC: 0 % (ref 0.0–0.2)

## 2019-02-19 LAB — BASIC METABOLIC PANEL
Anion gap: 10 (ref 5–15)
BUN: 32 mg/dL — ABNORMAL HIGH (ref 8–23)
CO2: 23 mmol/L (ref 22–32)
Calcium: 9 mg/dL (ref 8.9–10.3)
Chloride: 104 mmol/L (ref 98–111)
Creatinine, Ser: 1.1 mg/dL — ABNORMAL HIGH (ref 0.44–1.00)
GFR calc Af Amer: 49 mL/min — ABNORMAL LOW (ref >60)
GFR calc non Af Amer: 42 mL/min — ABNORMAL LOW (ref >60)
Glucose, Bld: 134 mg/dL — ABNORMAL HIGH (ref 70–99)
Potassium: 4.1 mmol/L (ref 3.5–5.1)
Sodium: 137 mmol/L (ref 135–145)

## 2019-02-19 LAB — TROPONIN I (HIGH SENSITIVITY)
Troponin I (High Sensitivity): 12 ng/L (ref <18)
Troponin I (High Sensitivity): 16 ng/L (ref <18)

## 2019-02-19 LAB — BRAIN NATRIURETIC PEPTIDE: B Natriuretic Peptide: 780 pg/mL — ABNORMAL HIGH (ref 0.0–100.0)

## 2019-02-19 MED ORDER — VITAMIN B-12 1000 MCG PO TABS
1000.0000 ug | ORAL_TABLET | ORAL | Status: DC
  Administered 2019-02-20: 1000 ug via ORAL
  Filled 2019-02-19 (×2): qty 1

## 2019-02-19 MED ORDER — FERROUS SULFATE 325 (65 FE) MG PO TABS
325.0000 mg | ORAL_TABLET | Freq: Every day | ORAL | Status: DC
  Administered 2019-02-20: 09:00:00 325 mg via ORAL
  Filled 2019-02-19: qty 1

## 2019-02-19 MED ORDER — ROSUVASTATIN CALCIUM 10 MG PO TABS: 20.0000 mg | ORAL_TABLET | Freq: Every day | ORAL | Status: DC

## 2019-02-19 MED ORDER — CLOPIDOGREL BISULFATE 75 MG PO TABS
75.0000 mg | ORAL_TABLET | Freq: Every day | ORAL | Status: DC
  Administered 2019-02-20: 75 mg via ORAL
  Filled 2019-02-19: qty 1

## 2019-02-19 MED ORDER — MAGNESIUM OXIDE 400 (241.3 MG) MG PO TABS
200.0000 mg | ORAL_TABLET | Freq: Two times a day (BID) | ORAL | Status: DC
  Administered 2019-02-20 (×2): 200 mg via ORAL
  Filled 2019-02-19 (×3): qty 1

## 2019-02-19 MED ORDER — ALPRAZOLAM 0.25 MG PO TABS
0.2500 mg | ORAL_TABLET | Freq: Four times a day (QID) | ORAL | Status: DC
  Administered 2019-02-20 (×3): 0.25 mg via ORAL
  Filled 2019-02-19 (×3): qty 1

## 2019-02-19 MED ORDER — CLONIDINE HCL 0.1 MG PO TABS
0.3000 mg | ORAL_TABLET | Freq: Two times a day (BID) | ORAL | Status: DC
  Administered 2019-02-20 (×2): 0.3 mg via ORAL
  Filled 2019-02-19 (×2): qty 3

## 2019-02-19 MED ORDER — POTASSIUM CHLORIDE CRYS ER 20 MEQ PO TBCR: 20.0000 meq | EXTENDED_RELEASE_TABLET | ORAL | Status: DC

## 2019-02-19 MED ORDER — ONDANSETRON HCL 4 MG/2ML IJ SOLN: 4.0000 mg | Freq: Four times a day (QID) | INTRAMUSCULAR | Status: DC | PRN

## 2019-02-19 MED ORDER — FAMOTIDINE 20 MG PO TABS
40.0000 mg | ORAL_TABLET | Freq: Every day | ORAL | Status: DC
  Administered 2019-02-20: 40 mg via ORAL
  Filled 2019-02-19: qty 2

## 2019-02-19 MED ORDER — ASPIRIN EC 81 MG PO TBEC
81.0000 mg | DELAYED_RELEASE_TABLET | Freq: Every day | ORAL | Status: DC
  Administered 2019-02-20: 09:00:00 81 mg via ORAL
  Filled 2019-02-19: qty 1

## 2019-02-19 MED ORDER — ASPIRIN 81 MG PO CHEW
324.0000 mg | CHEWABLE_TABLET | Freq: Once | ORAL | Status: AC
  Administered 2019-02-19: 17:00:00 324 mg via ORAL
  Filled 2019-02-19: qty 4

## 2019-02-19 MED ORDER — ENOXAPARIN SODIUM 30 MG/0.3ML ~~LOC~~ SOLN
30.0000 mg | SUBCUTANEOUS | Status: DC
  Administered 2019-02-20: 30 mg via SUBCUTANEOUS
  Filled 2019-02-19: qty 0.3

## 2019-02-19 MED ORDER — ACETAMINOPHEN 325 MG PO TABS: 650.0000 mg | ORAL_TABLET | ORAL | Status: DC | PRN

## 2019-02-19 MED ORDER — HYDRALAZINE HCL 50 MG PO TABS
50.0000 mg | ORAL_TABLET | Freq: Four times a day (QID) | ORAL | Status: DC
  Administered 2019-02-20 (×3): 50 mg via ORAL
  Filled 2019-02-19 (×3): qty 1

## 2019-02-19 MED ORDER — LEVOTHYROXINE SODIUM 50 MCG PO TABS
50.0000 ug | ORAL_TABLET | Freq: Every day | ORAL | Status: DC
  Administered 2019-02-20: 50 ug via ORAL
  Filled 2019-02-19: qty 1

## 2019-02-19 MED ORDER — ISOSORBIDE MONONITRATE ER 30 MG PO TB24
30.0000 mg | ORAL_TABLET | Freq: Every day | ORAL | Status: DC
  Administered 2019-02-20: 30 mg via ORAL
  Filled 2019-02-19: qty 1

## 2019-02-19 MED ORDER — FUROSEMIDE 40 MG PO TABS
40.0000 mg | ORAL_TABLET | Freq: Every day | ORAL | Status: DC
  Administered 2019-02-20: 40 mg via ORAL
  Filled 2019-02-19: qty 1

## 2019-02-19 MED ORDER — SODIUM CHLORIDE 0.9% FLUSH
3.0000 mL | Freq: Once | INTRAVENOUS | Status: AC
  Administered 2019-02-19: 3 mL via INTRAVENOUS

## 2019-02-19 MED ORDER — METOPROLOL TARTRATE 25 MG PO TABS
25.0000 mg | ORAL_TABLET | Freq: Two times a day (BID) | ORAL | Status: DC
  Administered 2019-02-20 (×2): 25 mg via ORAL
  Filled 2019-02-19 (×2): qty 1

## 2019-02-19 NOTE — ED Notes (Signed)
Family to remain with patient due to dementia at baseline; patient unable to answer questions appropriately during triage.

## 2019-02-19 NOTE — ED Provider Notes (Signed)
[facility] Emergency Department Provider Note   ____________________________________________   First MD Initiated Contact with Patient 02/19/19 1606     (approximate)  I have reviewed the triage vital signs and the nursing notes.   HISTORY  Chief Complaint Shortness of Breath    HPI Anita Ward is a 85 y.o. female with past medical history of CAD status post CABG, hypertension, diastolic CHF who presents to the ED complaining of shortness of breath.  Patient reports she was walking from her car to the restaurant for lunch today when she began to complain of significant shortness of breath and tightness in her chest.  She had to stop and take a break and her daughter was concerned enough to bring her to the ED.  Symptoms have improved significantly following her arrival in the ED and she currently denies any chest pain or shortness of breath.  She states she has otherwise been feeling well recently with no fevers, chills, or cough.  She has not noticed any pain or swelling in her legs and denies any recent surgeries or long trips.  She was recently admitted in July for mild N STEMI versus demand ischemia.        Past Medical History:  Diagnosis Date  . Anxiety   . Congestive heart failure (CHF) (HCC)   . Coronary artery disease   . Glaucoma   . HTN (hypertension)   . Osteoporosis     Patient Active Problem List   Diagnosis Date Noted  . NSTEMI (non-ST elevated myocardial infarction) (HCC) 01/14/2019  . Chest pain 02/19/2016  . Hyponatremia 02/19/2016  . MI (myocardial infarction) (HCC) 05/27/2015    Past Surgical History:  Procedure Laterality Date  . CARDIAC SURGERY    . CARDIAC SURGERY    . CORONARY ARTERY BYPASS GRAFT      Prior to Admission medications   Medication Sig Start Date End Date Taking? Authorizing Provider  ALPRAZolam (XANAX) 0.25 MG tablet Take 0.25 mg by mouth 4 (four) times daily. 12/18/18  Yes [provider]   aspirin EC 81 MG tablet Take 81 mg by mouth daily.   Yes [provider]  Calcium Carbonate-Vitamin D (CALCIUM 600+D) 600-400 MG-UNIT tablet Take 1 tablet by mouth 2 (two) times daily.   Yes [provider]  cholecalciferol (VITAMIN D) 1000 UNITS tablet Take 1,000 Units by mouth every morning.    Yes [provider]  cloNIDine (CATAPRES) 0.3 MG tablet Take 1 tablet (0.3 mg total) by mouth 2 (two) times daily. 02/22/16  Yes Houston SirenSainani, Vivek J, MD  clopidogrel (PLAVIX) 75 MG tablet Take 1 tablet (75 mg total) by mouth daily with breakfast. 05/29/15  Yes Altamese DillingVachhani, Vaibhavkumar, MD  CRANBERRY PO Take 1 capsule by mouth every morning.    Yes [provider]  famotidine (PEPCID) 40 MG tablet Take 40 mg by mouth daily. 02/03/19  Yes [provider]  ferrous sulfate 325 (65 FE) MG tablet Take 325 mg by mouth daily with breakfast. 10/02/18  Yes [provider]  furosemide (LASIX) 40 MG tablet Take 1 tablet (40 mg total) by mouth at bedtime. 02/03/16 02/19/19 Yes Schaevitz, Myra Rudeavid Matthew, MD  hydrALAZINE (APRESOLINE) 50 MG tablet Take 1 tablet (50 mg total) by mouth every 6 (six) hours. 03/22/16  Yes Enid BaasKalisetti, Radhika, MD  isosorbide mononitrate (IMDUR) 30 MG 24 hr tablet Take 30 mg by mouth daily. 07/22/18 07/22/19 Yes [provider]  levothyroxine (SYNTHROID) 50 MCG tablet Take 50  mcg by mouth daily before breakfast. Take on an empty stomach with a glass of water at least 30 to 60 minutes before breakfast 08/07/18  Yes [provider]  losartan (COZAAR) 100 MG tablet Take 1 tablet (100 mg total) by mouth at bedtime. 01/15/19  Yes Shirley, [place], DO  Magnesium 200 MG TABS Take 200 mg by mouth 2 (two) times daily.   Yes [provider]  metoprolol tartrate (LOPRESSOR) 25 MG tablet Take 25 mg by mouth 2 (two) times a day. 08/07/18  Yes [provider]  Potassium Chloride ER 20 MEQ TBCR Take 20 mEq by mouth every morning.    Yes  [provider]  rosuvastatin (CRESTOR) 20 MG tablet Take 1 tablet (20 mg total) by mouth daily at 6 PM. 01/15/19  Yes Enid Derry, [place], DO  vitamin B-12 (CYANOCOBALAMIN) 1000 MCG tablet Take 1,000 mcg by mouth every morning.    Yes [provider]  clotrimazole-betamethasone (LOTRISONE) cream Apply 1 application topically 2 (two) times daily as needed.     [provider]  nitroGLYCERIN (NITROSTAT) 0.4 MG SL tablet Place 0.4 mg under the tongue every 5 (five) minutes x 3 doses as needed for chest pain.     [provider]  traMADol (ULTRAM) 50 MG tablet Take 50 mg by mouth 3 (three) times daily as needed for moderate pain.    [provider]    Allergies Benicar [olmesartan], Cardura [doxazosin mesylate], Ciprofloxacin, Fosamax [alendronate sodium], Lisinopril, Norvasc [amlodipine besylate], Penicillins, Sulfa antibiotics, and Torsemide  Family History  Problem Relation Age of Onset  . Congestive Heart Failure Father   . CAD Brother     Social History Social History   Tobacco Use  . Smoking status: Never Smoker  . Smokeless tobacco: Never Used  Substance Use Topics  . Alcohol use: No  . Drug use: No    Review of Systems  Constitutional: No fever/chills Eyes: No visual changes. ENT: No sore throat. Cardiovascular: Positive for chest pain. Respiratory: Positive for shortness of breath. Gastrointestinal: No abdominal pain.  No nausea, no vomiting.  No diarrhea.  No constipation. Genitourinary: Negative for dysuria. Musculoskeletal: Negative for back pain. Skin: Negative for rash. Neurological: Negative for headaches, focal weakness or numbness.  ____________________________________________   PHYSICAL EXAM:  VITAL SIGNS: ED Triage Vitals  Enc Vitals Group     BP 02/19/19 1323 (!) 197/70     Pulse Rate 02/19/19 1323 66     Resp 02/19/19 1323 20     Temp 02/19/19 1323 98.3 F (36.8 C)     Temp Source 02/19/19 1323 Oral      SpO2 --      Weight 02/19/19 1324 130 lb (59 kg)     Height 02/19/19 1324 5' (1.524 m)     Head Circumference --      Peak Flow --      Pain Score 02/19/19 1324 0     Pain Loc --      Pain Edu? --      Excl. in [place]? --     Constitutional: Alert and oriented. Eyes: Conjunctivae are normal. Head: Atraumatic. Nose: No congestion/rhinnorhea. Mouth/Throat: Mucous membranes are moist. Neck: Normal ROM Cardiovascular: Normal rate, regular rhythm. Grossly normal heart sounds.  2+ radial pulses bilaterally. Respiratory: Normal respiratory effort.  No retractions. Lungs CTAB. Gastrointestinal: Soft and nontender. No distention. Genitourinary: deferred Musculoskeletal: No lower extremity tenderness nor edema. Neurologic:  Normal speech and language. No gross focal neurologic deficits  are appreciated. Skin:  Skin is warm, dry and intact. No rash noted. Psychiatric: Mood and affect are normal. Speech and behavior are normal.  ____________________________________________   LABS (all labs ordered are listed, but only abnormal results are displayed)  Labs Reviewed  BASIC METABOLIC PANEL - Abnormal; Notable for the following components:      Result Value   Glucose, Bld 134 (*)    BUN 32 (*)    Creatinine, Ser 1.10 (*)    GFR calc non Af Amer 42 (*)    GFR calc Af Amer 49 (*)    All other components within normal limits  CBC - Abnormal; Notable for the following components:   WBC 11.3 (*)    Hemoglobin 11.7 (*)    All other components within normal limits  BRAIN NATRIURETIC PEPTIDE - Abnormal; Notable for the following components:   B Natriuretic Peptide 780.0 (*)    All other components within normal limits  SARS CORONAVIRUS 2  TROPONIN I (HIGH SENSITIVITY)  TROPONIN I (HIGH SENSITIVITY)   ____________________________________________  EKG  ED ECG REPORT I, Chesley Noon, the attending physician, personally viewed and interpreted this ECG.   Date: 02/19/2019  EKG Time: 13:24   Rate: 66  Rhythm: normal sinus rhythm, PAC's noted, 1st degree AV block  Axis: LAD  Intervals:first-degree A-V block  and left bundle branch block  ST&T Change: No discordant changes noted    PROCEDURES  Procedure(s) performed (including Critical Care):  Procedures   ____________________________________________   INITIAL IMPRESSION / ASSESSMENT AND PLAN / ED COURSE       85 year old female with history of CAD status post CABG presenting to the ED following episode of dyspnea on exertion associated with chest tightness.  Differential includes ACS, PE, pneumonia, pneumothorax, CHF exacerbation, COPD, musculoskeletal, reflux.  EKG shows old left bundle branch block without any apparent ischemic changes, initial troponin within normal limits.  History somewhat concerning given pain and dyspnea present with exertion and patient is certainly high risk given her history and recent admission for an STEMI.  Will recheck second set troponin, advised patient on family that admission is indicated.  Do not suspect PE as symptoms now resolved, patient low risk by [place].  Chest x-ray shows some mild bilateral pleural effusion, with some mild pulmonary vascular congestion, likely related to patient's diastolic CHF.  Second troponin also within normal limits without significant change.  Patient high risk with heart score greater than 4 given her history and age.  She would benefit from admission for further evaluation for high risk chest pain.  Case discussed with hospitalist, who accepts patient for admission.  Patient received dose of aspirin here in the ED.      ____________________________________________   FINAL CLINICAL IMPRESSION(S) / ED DIAGNOSES  Final diagnoses:  Chest pain, unspecified type  Shortness of breath  Hx of CABG     ED Discharge Orders    None       Note:  This document was prepared using Dragon voice recognition software and may include unintentional dictation  errors.   Chesley Noon, MD 02/19/19 [place] Moment

## 2019-02-19 NOTE — ED Notes (Signed)
Full rainbow sent 

## 2019-02-19 NOTE — ED Notes (Signed)
Pt given warm blanket at this time 

## 2019-02-19 NOTE — ED Notes (Signed)
ED TO INPATIENT HANDOFF REPORT  ED Nurse Name and Phone #: Sam [phone]  S Name/Age/Gender Anita Ward 85 y.o. female Room/Bed: ED01A/ED01A  Code Status   Code Status: Prior  Home/SNF/Other Home Patient oriented to: self, place, time and situation Is this baseline? Yes   Triage Complete: Triage complete  Chief Complaint sob  Triage Note Pt comes via POV from home with c/o SOB. Pt states they were headed to eat and she started to have this spell of SOB. Pt denies any pain. Pt states it just hit her all of the sudden and came on real bad.  Pt's BP-197/70, O2-96%. Pt states she take medication for BP and was going to take another pill at lunch. Pt seems confused and keeps repeating she is SOB.  Pt denies any N/V or dizziness. Pt denies any CP  Mini neuro and VAN negative.   Allergies Allergies  Allergen Reactions  . Benicar [Olmesartan] Other (See Comments)    Reaction:  Unknown   . Cardura [Doxazosin Mesylate] Other (See Comments)    Reaction:  Unknown   . Ciprofloxacin Other (See Comments)    Reaction:  Unknown  . Fosamax [Alendronate Sodium] Other (See Comments)    Reaction:  Unknown  . Lisinopril Other (See Comments)    Reaction:  Unknown   . Norvasc [Amlodipine Besylate] Other (See Comments)    Reaction:  Unknown   . Penicillins Other (See Comments)    Reaction:  Unknown   . Sulfa Antibiotics Other (See Comments)    Reaction:  Unknown   . Torsemide Anxiety    Level of Care/Admitting Diagnosis ED Disposition    ED Disposition Condition [facility] Area: [place] [100120]  Level of Care: Telemetry [5]  Covid Evaluation: Confirmed COVID Negative  Diagnosis: Chest pain [254270]  Admitting Physician: Mayer Camel [[id number]]  Attending Physician: Mayer Camel [[id number]]  PT Class (Do Not Modify): Observation [104]  PT Acc Code (Do Not Modify): Observation [10022]       B Medical/Surgery History Past Medical  History:  Diagnosis Date  . Anxiety   . Congestive heart failure (CHF) ([place])   . Coronary artery disease   . Glaucoma   . HTN (hypertension)   . Osteoporosis    Past Surgical History:  Procedure Laterality Date  . CARDIAC SURGERY    . CARDIAC SURGERY    . CORONARY ARTERY BYPASS GRAFT       A IV Location/Drains/Wounds Patient Lines/Drains/Airways Status   Active Line/Drains/Airways    Name:   Placement date:   Placement time:   Site:   Days:   Peripheral IV 02/19/19 Left Arm   02/19/19    1626    Arm   less than 1          Intake/Output Last 24 hours No intake or output data in the 24 hours ending 02/19/19 2045  Labs/Imaging Results for orders placed or performed during the hospital encounter of 02/19/19 (from the past 48 hour(s))  Basic metabolic panel     Status: Abnormal   Collection Time: 02/19/19  1:31 PM  Result Value Ref Range   Sodium 137 135 - 145 mmol/L   Potassium 4.1 3.5 - 5.1 mmol/L   Chloride 104 98 - 111 mmol/L   CO2 23 22 - 32 mmol/L   Glucose, Bld 134 (H) 70 - 99 mg/dL   BUN 32 (H) 8 - 23 mg/dL   Creatinine, Ser  1.10 (H) 0.44 - 1.00 mg/dL   Calcium 9.0 8.9 - 37.1 mg/dL   GFR calc non Af Amer 42 (L) >60 mL/min   GFR calc Af Amer 49 (L) >60 mL/min   Anion gap 10 5 - 15    Comment: Performed at [facility], [address] [place], [place] [address]  CBC     Status: Abnormal   Collection Time: 02/19/19  1:31 PM  Result Value Ref Range   WBC 11.3 (H) 4.0 - 10.5 K/uL   RBC 4.41 3.87 - 5.11 MIL/uL   Hemoglobin 11.7 (L) 12.0 - 15.0 g/dL   HCT 93.8 10.1 - 75.1 %   MCV 83.2 80.0 - 100.0 fL   MCH 26.5 26.0 - 34.0 pg   MCHC 31.9 30.0 - 36.0 g/dL   RDW 02.5 85.2 - 77.8 %   Platelets 261 150 - 400 K/uL   nRBC 0.0 0.0 - 0.2 %    Comment: Performed at [facility], [address], [place], [place] [address]  Troponin I (High Sensitivity)     Status: None   Collection Time: 02/19/19  1:31 PM  Result Value Ref Range   Troponin I  (High Sensitivity) 12 <18 ng/L    Comment: (NOTE) Elevated high sensitivity troponin I (hsTnI) values and significant  changes across serial measurements may suggest ACS but many other  chronic and acute conditions are known to elevate hsTnI results.  Refer to the "Links" section for chest pain algorithms and additional  guidance. Performed at [facility], [address] [place], [place] [address]   Brain natriuretic peptide     Status: Abnormal   Collection Time: 02/19/19  1:31 PM  Result Value Ref Range   B Natriuretic Peptide 780.0 (H) 0.0 - 100.0 pg/mL    Comment: Performed at [facility], [address] [place], [place] [address]  Troponin I (High Sensitivity)     Status: None   Collection Time: 02/19/19  4:20 PM  Result Value Ref Range   Troponin I (High Sensitivity) 16 <18 ng/L    Comment: (NOTE) Elevated high sensitivity troponin I (hsTnI) values and significant  changes across serial measurements may suggest ACS but many other  chronic and acute conditions are known to elevate hsTnI results.  Refer to the "Links" section for chest pain algorithms and additional  guidance. Performed at [facility], [address] [place], [place] [address]    Dg Chest 2 View  Result Date: 02/19/2019 CLINICAL DATA:  Shortness of breath EXAM: CHEST - 2 VIEW COMPARISON:  December 25, 2017 FINDINGS: The heart size remains enlarged. Aortic calcifications are noted. The patient is status post prior median sternotomy. There are chronic appearing airspace opacities at the lung bases bilaterally. There is no pneumothorax. There appear to be new small bilateral pleural effusions. There is no acute osseous abnormality. There is diffuse osteopenia involving the thoracic spine. There is height loss of several lower thoracic vertebral bodies. IMPRESSION: 1. Cardiomegaly with small bilateral pleural effusions. There is likely some mild vascular congestion. 2. Chronic lung  changes at the lung bases bilaterally favored to represent scarring or atelectasis. Electronically Signed   By: Katherine Mantle M.D.   On: 02/19/2019 14:30    Pending Labs Unresulted Labs (From admission, onward)    Start     Ordered   02/19/19 1735  SARS CORONAVIRUS 2 Nasal Swab Aptima Multi Swab  (Asymptomatic/Tier 2 Patients Labs)  Once,   STAT  Question Answer Comment  Is this test for diagnosis or screening Screening   Symptomatic for COVID-19 as defined by CDC No   Hospitalized for COVID-19 No   Admitted to ICU for COVID-19 No   Previously tested for COVID-19 Yes   Resident in a congregate (group) care setting No   Employed in healthcare setting No   Pregnant No      02/19/19 1734   Signed and Held  CBC  (enoxaparin (LOVENOX)    CrCl >/= 30 ml/min)  Once,   R    Comments: Baseline for enoxaparin therapy IF NOT ALREADY DRAWN.  Notify MD if PLT < 100 K.    Signed and Held   Signed and Held  Creatinine, serum  (enoxaparin (LOVENOX)    CrCl >/= 30 ml/min)  Once,   R    Comments: Baseline for enoxaparin therapy IF NOT ALREADY DRAWN.    Signed and Held   Signed and Held  Creatinine, serum  (enoxaparin (LOVENOX)    CrCl >/= 30 ml/min)  Weekly,   R    Comments: while on enoxaparin therapy    Signed and Held   Signed and Held  Lipid panel  Once,   R     Signed and Held          Vitals/Pain Today's Vitals   02/19/19 1930 02/19/19 1936 02/19/19 2000 02/19/19 2030  BP:   (!) 190/68 (!) 138/47  Pulse: 90 92 67 (!) 57  Resp: 19 (!) 24 (!) 22 15  Temp:      TempSrc:      SpO2: 95% 92% 93% 93%  Weight:      Height:      PainSc:        Isolation Precautions No active isolations  Medications Medications  sodium chloride flush (NS) 0.9 % injection 3 mL (3 mLs Intravenous Given 02/19/19 1627)  aspirin chewable tablet 324 mg (324 mg Oral Given 02/19/19 1707)    Mobility walks with device Low fall risk   Focused Assessments Cardiac Assessment Handoff:  Cardiac  Rhythm: Normal sinus rhythm Lab Results  Component Value Date   CKTOTAL 63 05/26/2012   CKMB 1.3 05/26/2012   TROPONINI <0.03 12/25/2017   No results found for: DDIMER Does the Patient currently have chest pain? No     R Recommendations: See Admitting Provider Note  Report given to:   Additional Notes: Pt does have dementia and needs to be reoriented sometimes. Pt ambulatory with cane

## 2019-02-19 NOTE — ED Notes (Signed)
Grandson of pt called and given update and pt's status for admittance to hospital.  [phone]

## 2019-02-19 NOTE — ED Notes (Signed)
This EDT walked pt to the bathroom and brought pt a phone to use.

## 2019-02-19 NOTE — ED Triage Notes (Addendum)
Pt comes via POV from home with c/o SOB. Pt states they were headed to eat and she started to have this spell of SOB. Pt denies any pain. Pt states it just hit her all of the sudden and came on real bad.  Pt's BP-197/70, O2-96%. Pt states she take medication for BP and was going to take another pill at lunch. Pt seems confused and keeps repeating she is SOB.  Pt denies any N/V or dizziness. Pt denies any CP  Mini neuro and VAN negative.

## 2019-02-19 NOTE — ED Notes (Signed)
Family in triage room at this time. Family states pt does have dementia

## 2019-02-19 NOTE — H&P (Signed)
[facility] [place] [facility]   PATIENT NAME: Anita DonDorothy Ward    MR#:  [id number]  DATE OF BIRTH:  09/22/1919  DATE OF ADMISSION:  02/19/2019  PRIMARY CARE PHYSICIAN: Barbette ReichmannHande, Vishwanath, MD   REQUESTING/REFERRING PHYSICIAN: Chesley Noonharles Jessup, MD  CHIEF COMPLAINT:   Chief Complaint  Patient presents with  . Shortness of Breath    HISTORY OF PRESENT ILLNESS:  Anita Ward  is a 85 y.o. female with a known history of CAD status post coronary artery bypass graft, hypertension, diastolic CHF.  She presented to the emergency room accompanied by her niece reporting shortness of breath and chest tightness which began while she was walking from her car to a restaurant for lunch.  She described the chest pain as chest tightness with a pain score 6 out of 10 lasting less than 5 minutes with associated shortness of breath.  Patient denies experiencing diaphoresis or nausea with chest pain.  She has a history of recent admission in July for mild N STEMI.  At the time I am seeing her, she denies chest pain or shortness of breath.  She denies fevers or chills.  She denies cough.  She has noted no increased edema in her extremities.  She denies recent surgical procedures or long trips.  Troponin is 21 on arrival with BNP 780.  Chest x-ray shows small bilateral pleural effusion with mild vascular congestion.  We have admitted her to the hospitalist service for observation overnight.  PAST MEDICAL HISTORY:   Past Medical History:  Diagnosis Date  . Anxiety   . Congestive heart failure (CHF) (HCC)   . Coronary artery disease   . Glaucoma   . HTN (hypertension)   . Osteoporosis     PAST SURGICAL HISTORY:   Past Surgical History:  Procedure Laterality Date  . CARDIAC SURGERY    . CARDIAC SURGERY    . CORONARY ARTERY BYPASS GRAFT      SOCIAL HISTORY:   Social History   Tobacco Use  . Smoking status: Never Smoker  . Smokeless tobacco: Never Used  Substance Use Topics  .  Alcohol use: No    FAMILY HISTORY:   Family History  Problem Relation Age of Onset  . Congestive Heart Failure Father   . CAD Brother     DRUG ALLERGIES:   Allergies  Allergen Reactions  . Benicar [Olmesartan] Other (See Comments)    Reaction:  Unknown   . Cardura [Doxazosin Mesylate] Other (See Comments)    Reaction:  Unknown   . Ciprofloxacin Other (See Comments)    Reaction:  Unknown  . Fosamax [Alendronate Sodium] Other (See Comments)    Reaction:  Unknown  . Lisinopril Other (See Comments)    Reaction:  Unknown   . Norvasc [Amlodipine Besylate] Other (See Comments)    Reaction:  Unknown   . Penicillins Other (See Comments)    Reaction:  Unknown   . Sulfa Antibiotics Other (See Comments)    Reaction:  Unknown   . Torsemide Anxiety    REVIEW OF SYSTEMS:   Review of Systems  Constitutional: Negative for chills, fever and malaise/fatigue.  HENT: Negative for congestion, sinus pain and sore throat.   Respiratory: Positive for shortness of breath. Negative for cough, hemoptysis, sputum production and wheezing.   Cardiovascular: Positive for chest pain. Negative for palpitations, orthopnea and PND.  Gastrointestinal: Negative for abdominal pain, constipation, diarrhea, heartburn, nausea and vomiting.  Genitourinary: Negative for dysuria, flank pain and hematuria.  Musculoskeletal: Negative for back pain, falls, myalgias and neck pain.  Skin: Negative for itching and rash.  Neurological: Negative for dizziness, tingling, weakness and headaches.  Psychiatric/Behavioral: Negative for depression.    MEDICATIONS AT HOME:   Prior to Admission medications   Medication Sig Start Date End Date Taking? Authorizing Provider  ALPRAZolam (XANAX) 0.25 MG tablet Take 0.25 mg by mouth 4 (four) times daily. 12/18/18  Yes [provider]  aspirin EC 81 MG tablet Take 81 mg by mouth daily.   Yes [provider]  Calcium Carbonate-Vitamin D (CALCIUM 600+D) 600-400  MG-UNIT tablet Take 1 tablet by mouth 2 (two) times daily.   Yes [provider]  cholecalciferol (VITAMIN D) 1000 UNITS tablet Take 1,000 Units by mouth every morning.    Yes [provider]  cloNIDine (CATAPRES) 0.3 MG tablet Take 1 tablet (0.3 mg total) by mouth 2 (two) times daily. 02/22/16  Yes Houston Siren, MD  clopidogrel (PLAVIX) 75 MG tablet Take 1 tablet (75 mg total) by mouth daily with breakfast. 05/29/15  Yes Altamese Dilling, MD  CRANBERRY PO Take 1 capsule by mouth every morning.    Yes [provider]  famotidine (PEPCID) 40 MG tablet Take 40 mg by mouth daily. 02/03/19  Yes [provider]  ferrous sulfate 325 (65 FE) MG tablet Take 325 mg by mouth daily with breakfast. 10/02/18  Yes [provider]  furosemide (LASIX) 40 MG tablet Take 1 tablet (40 mg total) by mouth at bedtime. 02/03/16 02/19/19 Yes Schaevitz, Myra Rude, MD  hydrALAZINE (APRESOLINE) 50 MG tablet Take 1 tablet (50 mg total) by mouth every 6 (six) hours. 03/22/16  Yes Enid Baas, MD  isosorbide mononitrate (IMDUR) 30 MG 24 hr tablet Take 30 mg by mouth daily. 07/22/18 07/22/19 Yes [provider]  levothyroxine (SYNTHROID) 50 MCG tablet Take 50 mcg by mouth daily before breakfast. Take on an empty stomach with a glass of water at least 30 to 60 minutes before breakfast 08/07/18  Yes [provider]  losartan (COZAAR) 100 MG tablet Take 1 tablet (100 mg total) by mouth at bedtime. 01/15/19  Yes Shirley, [place], DO  Magnesium 200 MG TABS Take 200 mg by mouth 2 (two) times daily.   Yes [provider]  metoprolol tartrate (LOPRESSOR) 25 MG tablet Take 25 mg by mouth 2 (two) times a day. 08/07/18  Yes [provider]  Potassium Chloride ER 20 MEQ TBCR Take 20 mEq by mouth every morning.    Yes [provider]  rosuvastatin (CRESTOR) 20 MG tablet Take 1 tablet (20 mg total) by mouth daily at 6 PM. 01/15/19  Yes Talbert Forest,  [place], DO  vitamin B-12 (CYANOCOBALAMIN) 1000 MCG tablet Take 1,000 mcg by mouth every morning.    Yes [provider]  clotrimazole-betamethasone (LOTRISONE) cream Apply 1 application topically 2 (two) times daily as needed.     [provider]  nitroGLYCERIN (NITROSTAT) 0.4 MG SL tablet Place 0.4 mg under the tongue every 5 (five) minutes x 3 doses as needed for chest pain.     [provider]  traMADol (ULTRAM) 50 MG tablet Take 50 mg by mouth 3 (three) times daily as needed for moderate pain.    [provider]      VITAL SIGNS:  Blood pressure (!) 195/87, pulse 66, temperature 98.3 F (36.8 C), temperature source Oral, resp. rate 15, height 5' (1.524 m), weight 59 kg, SpO2 95 %.  PHYSICAL EXAMINATION:  Physical Exam  GENERAL:  85 y.o.-year-old patient lying in the bed with no acute distress.  EYES: Pupils equal, round, reactive to light and accommodation. No scleral icterus. Extraocular muscles intact.  HEENT: Head atraumatic, normocephalic. Oropharynx and nasopharynx clear.  NECK:  Supple, no jugular venous distention. No thyroid enlargement, no tenderness.  LUNGS: Normal breath sounds bilaterally, no wheezing, rales,rhonchi or crepitation. No use of accessory muscles of respiration.  CARDIOVASCULAR: Regular rate and rhythm, S1, S2 normal. No murmurs, rubs, or gallops.  ABDOMEN: Soft, nondistended, nontender. Bowel sounds present. No organomegaly or mass.  EXTREMITIES: No pedal edema, cyanosis, or clubbing.  NEUROLOGIC: Cranial nerves II through XII are intact. Muscle strength 5/5 in all extremities. Sensation intact. Gait not checked.  PSYCHIATRIC: The patient is alert and oriented x 3.  Normal affect and good eye contact. SKIN: No obvious rash, lesion, or ulcer.   LABORATORY PANEL:   CBC Recent Labs  Lab 02/19/19 1331  WBC 11.3*  HGB 11.7*  HCT 36.7  PLT 261    ------------------------------------------------------------------------------------------------------------------  Chemistries  Recent Labs  Lab 02/19/19 1331  NA 137  K 4.1  CL 104  CO2 23  GLUCOSE 134*  BUN 32*  CREATININE 1.10*  CALCIUM 9.0   ------------------------------------------------------------------------------------------------------------------  Cardiac Enzymes No results for input(s): TROPONINI in the last 168 hours. ------------------------------------------------------------------------------------------------------------------  RADIOLOGY:  Dg Chest 2 View  Result Date: 02/19/2019 CLINICAL DATA:  Shortness of breath EXAM: CHEST - 2 VIEW COMPARISON:  December 25, 2017 FINDINGS: The heart size remains enlarged. Aortic calcifications are noted. The patient is status post prior median sternotomy. There are chronic appearing airspace opacities at the lung bases bilaterally. There is no pneumothorax. There appear to be new small bilateral pleural effusions. There is no acute osseous abnormality. There is diffuse osteopenia involving the thoracic spine. There is height loss of several lower thoracic vertebral bodies. IMPRESSION: 1. Cardiomegaly with small bilateral pleural effusions. There is likely some mild vascular congestion. 2. Chronic lung changes at the lung bases bilaterally favored to represent scarring or atelectasis. Electronically Signed   By: Constance Holster M.D.   On: 02/19/2019 14:30      IMPRESSION AND PLAN:   1.  Chest pain - With recent history of MI in July 2020 - Continue to trend troponin levels - Repeat EKG in the a.m. - Dr. Nehemiah Massed consulted for further evaluation and recommendations given patient's current complaint and recent history. -Telemetry monitoring -BMP and CBC in the a.m. -Echocardiogram  2.  Exacerbation CHF/diastolic - Lasix 40 mg IV once a day - Current BNP is 780 - Telemetry monitoring  3. hypertension - Clonidine and  hydralazine continued -We will treat persistent hypertension expectantly  4.  Hyperlipidemia -Statin therapy continued -Lipid panel pending  5.  Hypothyroidism - TSH pending -Synthroid continued  DVT and PPI prophylaxis    All the records are reviewed and case discussed with ED provider. The plan of care was discussed in details with the patient (and family). I answered all questions. The patient agreed to proceed with the above mentioned plan. Further management will depend upon hospital course.   CODE STATUS: Full code  TOTAL TIME TAKING CARE OF THIS PATIENT: 45 minutes.    [place] on 02/19/2019 at 7:13 PM  Pager - [phone]  After 6pm go to [email or web address] - Proofreader  [facility] [place] [facility]  Office  [phone]  CC: Primary care physician; Tracie Harrier, MD   Note: This dictation was prepared  with Dragon dictation along with smaller phrase technology. Any transcriptional errors that result from this process are unintentional.

## 2019-02-19 NOTE — ED Notes (Signed)
Pt is given meal tray and drink at this time

## 2019-02-19 NOTE — ED Notes (Signed)
RN informed pt is on her way at this time.

## 2019-02-19 NOTE — ED Notes (Signed)
Pt took BP medication at this time. Pt states that it was due to take at lunch and she wasn't able to at the time due to coming here. MD aware

## 2019-02-20 ENCOUNTER — Observation Stay: Admit: 2019-02-20 | Payer: Medicare Other

## 2019-02-20 DIAGNOSIS — R0789 Other chest pain: Secondary | ICD-10-CM | POA: Diagnosis not present

## 2019-02-20 LAB — TROPONIN I (HIGH SENSITIVITY)
Troponin I (High Sensitivity): 21 ng/L — ABNORMAL HIGH (ref <18)
Troponin I (High Sensitivity): 19 ng/L — ABNORMAL HIGH (ref <18)

## 2019-02-20 LAB — SARS CORONAVIRUS 2 (TAT 6-24 HRS): SARS Coronavirus 2: NEGATIVE

## 2019-02-20 LAB — LIPID PANEL
Cholesterol: 84 mg/dL (ref 0–200)
HDL: 37 mg/dL — ABNORMAL LOW (ref >40)
LDL Cholesterol: 38 mg/dL (ref 0–99)
Total CHOL/HDL Ratio: 2.3 RATIO
Triglycerides: 46 mg/dL (ref <150)
VLDL: 9 mg/dL (ref 0–40)

## 2019-02-20 MED ORDER — SODIUM CHLORIDE 0.9% FLUSH
10.0000 mL | Freq: Two times a day (BID) | INTRAVENOUS | Status: DC
  Administered 2019-02-20: 10 mL via INTRAVENOUS

## 2019-02-20 MED ORDER — HYDRALAZINE HCL 20 MG/ML IJ SOLN: 10.0000 mg | Freq: Four times a day (QID) | INTRAMUSCULAR | Status: DC | PRN

## 2019-02-20 MED ORDER — FUROSEMIDE 40 MG PO TABS: 40.0000 mg | ORAL_TABLET | Freq: Every day | ORAL | 0 refills | Status: AC

## 2019-02-20 MED ORDER — FUROSEMIDE 10 MG/ML IJ SOLN
40.0000 mg | Freq: Every day | INTRAMUSCULAR | Status: DC
  Administered 2019-02-20: 40 mg via INTRAVENOUS
  Filled 2019-02-20: qty 4

## 2019-02-20 NOTE — TOC Transition Note (Signed)
Transition of Care [facility]) - CM/SW Discharge Note   Patient Details  Name: Anita Ward MRN: [id number] Date of Birth: [birth year]  [facility] CM/SW Contact:  Ross Ludwig, LCSW Phone Number: 02/20/2019, 3:53 PM   Clinical Narrative:     Patient is a 85 year old female who is alert and oriented x4.  Patient states she lives alone in her house, patient has a grandson and son who visit he regularly, and also has a neigbor who checks on her regularly.  Patient is active with [facility], she will be receiving home health PT and RN.  Patient states she likes the home health agency she is using.  Patient did not express any concerns or issues with returning back home.  Patient stated her grandson should be able to pick her up.   Final next level of care: [place] Barriers to Discharge: Barriers Resolved   Patient Goals and CMS Choice Patient states their goals for this hospitalization and ongoing recovery are:: To return back home with home health. CMS Medicare.gov Compare Post Acute Care list provided to:: Patient Choice offered to / list presented to : Patient  Discharge Placement  Patient will be discharging back home with home health PT and RN.                     Discharge Plan and Services In-house Referral: Clinical Social Work   Post Acute Care Choice: Home Health          DME Arranged: N/A DME Agency: NA       HH Arranged: RN, PT Date HH Agency Contacted: 02/20/19 Time Turner: [id number] Representative spoke with at [place]: [place]: [facility]  Social Determinants of Health ([place]) Interventions     Readmission Risk Interventions Readmission Risk Prevention Plan 01/15/2019  Transportation Screening Complete  PCP or Specialist Appt within 5-7 Days Complete  Home Care Screening Complete  Medication Review (RN CM) Complete  Some recent data might be hidden

## 2019-02-20 NOTE — Plan of Care (Signed)
  Problem: Education: Goal: Knowledge of General Education information will improve Description: Including pain rating scale, medication(s)/side effects and non-pharmacologic comfort measures Outcome: Progressing   Problem: Health Behavior/Discharge Planning: Goal: Ability to manage health-related needs will improve Outcome: Progressing   Problem: Clinical Measurements: Goal: Ability to maintain clinical measurements within normal limits will improve Outcome: Progressing Goal: Will remain free from infection Outcome: Progressing Note: Remains afebrile Goal: Diagnostic test results will improve Outcome: Progressing Note: BNP 780, Troponins 12,16,21,19 BUN 32/1.10 Goal: Respiratory complications will improve Outcome: Progressing Goal: Cardiovascular complication will be avoided Outcome: Progressing Note: No chest pain or arrhythmias

## 2019-02-20 NOTE — Progress Notes (Signed)
Advanced Care Plan.  Purpose of Encounter: CODE STATUS. Parties in Attendance: The patient and me Patient's Decisional Capacity: Yes. Medical Story: Anita Ward  is a 85 y.o. female with a known history of CAD status post coronary artery bypass graft, hypertension, diastolic CHF.  The patient is admitted for chest pain and mild fluid overload.  I discussed with patient about her current condition, prognosis and CODE STATUS.  The patient said she is living alone and has multiple medical problems but does want to be resuscitated and intubated if she has cardiopulmonary arrest. Plan:  Code Status: Full code Time spent discussing advance care planning: 18 minutes.

## 2019-02-20 NOTE — Discharge Summary (Signed)
[facility] [place] [facility]   PATIENT NAME: Anita Ward    MR#:  [id number]  DATE OF BIRTH:  1919-08-07  DATE OF ADMISSION:  02/19/2019   ADMITTING PHYSICIAN: Charlann Lange, MD  DATE OF DISCHARGE: 02/20/2019  PRIMARY CARE PHYSICIAN: Barbette Reichmann, MD   ADMISSION DIAGNOSIS:  Shortness of breath [R06.02] Hx of CABG [Z95.1] Chest pain, unspecified type [R07.9] DISCHARGE DIAGNOSIS:  Active Problems:   Chest pain  SECONDARY DIAGNOSIS:   Past Medical History:  Diagnosis Date  . Anxiety   . Congestive heart failure (CHF) (HCC)   . Coronary artery disease   . Glaucoma   . HTN (hypertension)   . Osteoporosis    HOSPITAL COURSE:  1.  Chest pain - With recent history of MI in July 2020 Troponin is unremarkable. - Repeat EKG no change. Per cardiologist consult, no further intervention or diagnostics indicated at this time.  Follow-up with Dr. Rhae Lerner as outpatient.  2.    Mild acute on chronic diastolic CHF due to mild fluid overload. She is treated with Lasix 40 mg IV once a day, changed to p.o. 40 mg once a day.   3. hypertension Continue home hypertension medication.  4.  Hyperlipidemia -Statin therapy continued -Lipid panel is unremarkable.  5.  Hypothyroidism -Synthroid continued  Generalized weakness.  Home health and PT. DISCHARGE CONDITIONS:  Stable, discharge to home with home health today. CONSULTS OBTAINED:  Treatment Team:  Lamar Blinks, MD DRUG ALLERGIES:   Allergies  Allergen Reactions  . Benicar [Olmesartan] Other (See Comments)    Reaction:  Unknown   . Cardura [Doxazosin Mesylate] Other (See Comments)    Reaction:  Unknown   . Ciprofloxacin Other (See Comments)    Reaction:  Unknown  . Fosamax [Alendronate Sodium] Other (See Comments)    Reaction:  Unknown  . Lisinopril Other (See Comments)    Reaction:  Unknown   . Norvasc [Amlodipine Besylate] Other (See Comments)    Reaction:  Unknown   .  Penicillins Other (See Comments)    Reaction:  Unknown   . Sulfa Antibiotics Other (See Comments)    Reaction:  Unknown   . Torsemide Anxiety   DISCHARGE MEDICATIONS:   Allergies as of 02/20/2019      Reactions   Benicar [olmesartan] Other (See Comments)   Reaction:  Unknown    Cardura [doxazosin Mesylate] Other (See Comments)   Reaction:  Unknown    Ciprofloxacin Other (See Comments)   Reaction:  Unknown   Fosamax [alendronate Sodium] Other (See Comments)   Reaction:  Unknown   Lisinopril Other (See Comments)   Reaction:  Unknown    Norvasc [amlodipine Besylate] Other (See Comments)   Reaction:  Unknown    Penicillins Other (See Comments)   Reaction:  Unknown    Sulfa Antibiotics Other (See Comments)   Reaction:  Unknown    Torsemide Anxiety      Medication List    TAKE these medications   ALPRAZolam 0.25 MG tablet Commonly known as: XANAX Take 0.25 mg by mouth 4 (four) times daily.   aspirin EC 81 MG tablet Take 81 mg by mouth daily.   Calcium 600+D 600-400 MG-UNIT tablet Generic drug: Calcium Carbonate-Vitamin D Take 1 tablet by mouth 2 (two) times daily.   cholecalciferol 1000 units tablet Commonly known as: VITAMIN D Take 1,000 Units by mouth every morning.   cloNIDine 0.3 MG tablet Commonly known as: CATAPRES Take 1 tablet (0.3 mg  total) by mouth 2 (two) times daily.   clopidogrel 75 MG tablet Commonly known as: PLAVIX Take 1 tablet (75 mg total) by mouth daily with breakfast.   clotrimazole-betamethasone cream Commonly known as: LOTRISONE Apply 1 application topically 2 (two) times daily as needed.   CRANBERRY PO Take 1 capsule by mouth every morning.   famotidine 40 MG tablet Commonly known as: PEPCID Take 40 mg by mouth daily.   ferrous sulfate 325 (65 FE) MG tablet Take 325 mg by mouth daily with breakfast.   furosemide 40 MG tablet Commonly known as: Lasix Take 1 tablet (40 mg total) by mouth at bedtime.   hydrALAZINE 50 MG tablet  Commonly known as: APRESOLINE Take 1 tablet (50 mg total) by mouth every 6 (six) hours.   isosorbide mononitrate 30 MG 24 hr tablet Commonly known as: IMDUR Take 30 mg by mouth daily.   levothyroxine 50 MCG tablet Commonly known as: SYNTHROID Take 50 mcg by mouth daily before breakfast. Take on an empty stomach with a glass of water at least 30 to 60 minutes before breakfast   losartan 100 MG tablet Commonly known as: COZAAR Take 1 tablet (100 mg total) by mouth at bedtime.   Magnesium 200 MG Tabs Take 200 mg by mouth 2 (two) times daily.   metoprolol tartrate 25 MG tablet Commonly known as: LOPRESSOR Take 25 mg by mouth 2 (two) times a day.   nitroGLYCERIN 0.4 MG SL tablet Commonly known as: NITROSTAT Place 0.4 mg under the tongue every 5 (five) minutes x 3 doses as needed for chest pain.   Potassium Chloride ER 20 MEQ Tbcr Take 20 mEq by mouth every morning.   rosuvastatin 20 MG tablet Commonly known as: CRESTOR Take 1 tablet (20 mg total) by mouth daily at 6 PM.   traMADol 50 MG tablet Commonly known as: ULTRAM Take 50 mg by mouth 3 (three) times daily as needed for moderate pain.   vitamin B-12 1000 MCG tablet Commonly known as: CYANOCOBALAMIN Take 1,000 mcg by mouth every morning.        DISCHARGE INSTRUCTIONS:  See AVS.  If you experience worsening of your admission symptoms, develop shortness of breath, life threatening emergency, suicidal or homicidal thoughts you must seek medical attention immediately by calling 911 or calling your MD immediately  if symptoms less severe.  You Must read complete instructions/literature along with all the possible adverse reactions/side effects for all the Medicines you take and that have been prescribed to you. Take any new Medicines after you have completely understood and accpet all the possible adverse reactions/side effects.   Please note  You were cared for by a hospitalist during your hospital stay. If you have  any questions about your discharge medications or the care you received while you were in the hospital after you are discharged, you can call the unit and asked to speak with the hospitalist on call if the hospitalist that took care of you is not available. Once you are discharged, your primary care physician will handle any further medical issues. Please note that NO REFILLS for any discharge medications will be authorized once you are discharged, as it is imperative that you return to your primary care physician (or establish a relationship with a primary care physician if you do not have one) for your aftercare needs so that they can reassess your need for medications and monitor your lab values.    On the day of Discharge:  VITAL SIGNS:  Blood pressure (!) 189/72, pulse 64, temperature 98.4 F (36.9 C), temperature source Oral, resp. rate 19, height 5\' 2"  (1.575 m), weight 58.2 kg, SpO2 95 %. PHYSICAL EXAMINATION:  GENERAL:  85 y.o.-year-old patient lying in the bed with no acute distress.  EYES: Pupils equal, round, reactive to light and accommodation. No scleral icterus. Extraocular muscles intact.  HEENT: Head atraumatic, normocephalic. Oropharynx and nasopharynx clear.  NECK:  Supple, no jugular venous distention. No thyroid enlargement, no tenderness.  LUNGS: Normal breath sounds bilaterally, no wheezing, mild basilar rales, no rhonchi or crepitation. No use of accessory muscles of respiration.  CARDIOVASCULAR: S1, S2 normal. No murmurs, rubs, or gallops.  ABDOMEN: Soft, non-tender, non-distended. Bowel sounds present. No organomegaly or mass.  EXTREMITIES: No pedal edema, cyanosis, or clubbing.  NEUROLOGIC: Cranial nerves II through XII are intact. Muscle strength 4/5 in all extremities. Sensation intact. Gait not checked.  PSYCHIATRIC: The patient is alert and oriented x 3.  SKIN: No obvious rash, lesion, or ulcer.  DATA REVIEW:   CBC Recent Labs  Lab 02/19/19 1331  WBC 11.3*   HGB 11.7*  HCT 36.7  PLT 261    Chemistries  Recent Labs  Lab 02/19/19 1331  NA 137  K 4.1  CL 104  CO2 23  GLUCOSE 134*  BUN 32*  CREATININE 1.10*  CALCIUM 9.0     Microbiology Results  Results for orders placed or performed during the hospital encounter of 02/19/19  SARS CORONAVIRUS 2 Nasal Swab Aptima Multi Swab     Status: None   Collection Time: 02/19/19  6:12 PM   Specimen: Aptima Multi Swab; Nasal Swab  Result Value Ref Range Status   SARS Coronavirus 2 NEGATIVE NEGATIVE Final    Comment: (NOTE) SARS-CoV-2 target nucleic acids are NOT DETECTED. The SARS-CoV-2 RNA is generally detectable in upper and lower respiratory specimens during the acute phase of infection. Negative results do not preclude SARS-CoV-2 infection, do not rule out co-infections with other pathogens, and should not be used as the sole basis for treatment or other patient management decisions. Negative results must be combined with clinical observations, patient history, and epidemiological information. The expected result is Negative. Fact Sheet for Patients: SugarRoll.be Fact Sheet for Healthcare Providers: https://www.woods-mathews.com/ This test is not yet approved or cleared by the [place] FDA and  has been authorized for detection and/or diagnosis of SARS-CoV-2 by FDA under an Emergency Use Authorization (EUA). This EUA will remain  in effect (meaning this test can be used) for the duration of the COVID-19 declaration under Section 56 4(b)(1) of the Act, 21 U.S.C. section 360bbb-3(b)(1), unless the authorization is terminated or revoked sooner. Performed at [facility], [place] [address] [place], [place] [address]     RADIOLOGY:  No results found.   Management plans discussed with the patient, family and they are in agreement.  CODE STATUS: Full Code   TOTAL TIME TAKING CARE OF THIS PATIENT: 32 minutes.    Demetrios Loll M.D  on 02/20/2019 at 3:01 PM  Between 7am to 6pm - Pager - [phone]  After 6pm go to [email or web address] - Proofreader  [facility]  Office  [phone]  CC: Primary care physician; Tracie Harrier, MD   Note: This dictation was prepared with Dragon dictation along with smaller phrase technology. Any transcriptional errors that result from this process are unintentional.

## 2019-02-20 NOTE — Evaluation (Signed)
Physical Therapy Evaluation Patient Details Name: Anita Ward MRN: [id number] DOB: 1920/05/10 Today's Date: 02/20/2019   History of Present Illness  85yo F here with chest pain shortness of breath, last month here for similar with cardiology reporting an acute NSTEMI. PMH: HTNm COPD, GERD, HLD, CHF, GAD.  Clinical Impression  Pt did reasonably well with PT session this date.  She was able to get to EOB and rise to standing w/o any hesitancy or assist.  She was able to ambulate with the QC ~40 ft, but then trial with RW was much faster and more confident.  Pt's O2 stayed in the 90s (low 90s from mid) and HR increased from 70s to 90s but pt did not have excessive fatigue but she did relatively well and reports that if this wasn't her first time getting up since coming to the hospital she could have done more (but also notes that she normally does not walk more than this anyway).  Pt is confident about being able to go home with the help she has, is open to having PT at home.    Follow Up Recommendations Home health PT    Equipment Recommendations  None recommended by PT    Recommendations for Other Services       Precautions / Restrictions Precautions Precautions: Fall Restrictions Weight Bearing Restrictions: No      Mobility  Bed Mobility Overal bed mobility: Modified Independent             General bed mobility comments: Pt able to get herself to EOB w/o assist, confident and safe t/o the transition  Transfers Overall transfer level: Modified independent Equipment used: Quad cane             General transfer comment: Pt able to rise to standing with good confidence, no assist needed, no safety concerns  Ambulation/Gait Ambulation/Gait assistance: Min guard Gait Distance (Feet): 100 Feet Assistive device: Rolling walker (2 wheeled);Quad cane       General Gait Details: Pt is able to ambulate with QC and FWW.  She did considerably better with walker (increased  speed, cadence and confidence) but was safe with both.  Pt's HR did go from 70s to high 90s with the effort and O2 from mid to low 90s but admittedly she very rarely does more than 100 ft at any time anyway.  Stairs            Wheelchair Mobility    Modified Rankin (Stroke Patients Only)       Balance Overall balance assessment: Modified Independent                                           Pertinent Vitals/Pain Pain Assessment: No/denies pain    Home Living Family/patient expects to be discharged to:: Private residence Living Arrangements: Alone Available Help at Discharge: Family;Friend(s)(grandson in [place], son every weekend, neighbors frequently) Type of Home: House Home Access: Stairs to enter   [employer] of Steps: 1   Home Equipment: Environmental consultant - 2 wheels;Walker - 4 wheels;Cane - quad      Prior Function Level of Independence: Independent with assistive device(s)         Comments: Pt reports that she is able to do light house work, typically only uses QC but does use walkers at times     Hand Dominance  Extremity/Trunk Assessment   Upper Extremity Assessment Upper Extremity Assessment: Generalized weakness(age appropriate limitations, limited shld elevation b/l)    Lower Extremity Assessment Lower Extremity Assessment: Generalized weakness       Communication   Communication: No difficulties  Cognition Arousal/Alertness: Awake/alert Behavior During Therapy: WFL for tasks assessed/performed Overall Cognitive Status: Difficult to assess                                 General Comments: mild confusion, pleasant and appropriate t/o session      General Comments      Exercises     Assessment/Plan    PT Assessment Patient needs continued PT services  PT Problem List Decreased strength;Decreased range of motion;Decreased activity tolerance;Decreased balance;Decreased mobility;Decreased  cognition;Decreased knowledge of use of DME;Decreased safety awareness;Cardiopulmonary status limiting activity       PT Treatment Interventions DME instruction;Gait training;Stair training;Functional mobility training;Therapeutic activities;Therapeutic exercise;Balance training;Neuromuscular re-education;Patient/family education;Cognitive remediation    PT Goals (Current goals can be found in the Care Plan section)  Acute Rehab PT Goals Patient Stated Goal: go home ASAP PT Goal Formulation: With patient Time For Goal Achievement: 03/06/19 Potential to Achieve Goals: Good    Frequency Min 2X/week   Barriers to discharge        Co-evaluation               AM-PAC PT "6 Clicks" Mobility  Outcome Measure Help needed turning from your back to your side while in a flat bed without using bedrails?: None Help needed moving from lying on your back to sitting on the side of a flat bed without using bedrails?: None Help needed moving to and from a bed to a chair (including a wheelchair)?: None Help needed standing up from a chair using your arms (e.g., wheelchair or bedside chair)?: None Help needed to walk in hospital room?: None Help needed climbing 3-5 steps with a railing? : A Little 6 Click Score: 23    End of Session Equipment Utilized During Treatment: Gait belt Activity Tolerance: Patient limited by fatigue;Patient tolerated treatment well Patient left: with bed alarm set;with call bell/phone within reach Nurse Communication: Mobility status PT Visit Diagnosis: Difficulty in walking, not elsewhere classified (R26.2);Muscle weakness (generalized) (M62.81)    Time: 1610-9604 PT Time Calculation (min) (ACUTE ONLY): 22 min   Charges:   PT Evaluation $PT Eval Low Complexity: 1 Low          Kreg Shropshire, DPT 02/20/2019, 1:04 PM

## 2019-02-20 NOTE — Plan of Care (Signed)
  Problem: Education: Goal: Knowledge of General Education information will improve Description: Including pain rating scale, medication(s)/side effects and non-pharmacologic comfort measures 02/20/2019 1620 by Etheleen Nicks, RN Outcome: Adequate for Discharge 02/20/2019 1033 by Etheleen Nicks, RN Outcome: Progressing   Problem: Health Behavior/Discharge Planning: Goal: Ability to manage health-related needs will improve 02/20/2019 1620 by Etheleen Nicks, RN Outcome: Adequate for Discharge 02/20/2019 1033 by Etheleen Nicks, RN Outcome: Progressing   Problem: Clinical Measurements: Goal: Ability to maintain clinical measurements within normal limits will improve 02/20/2019 1620 by Etheleen Nicks, RN Outcome: Adequate for Discharge 02/20/2019 1033 by Etheleen Nicks, RN Outcome: Progressing Goal: Will remain free from infection 02/20/2019 1620 by Etheleen Nicks, RN Outcome: Adequate for Discharge 02/20/2019 1033 by Etheleen Nicks, RN Outcome: Progressing Note: Remains afebrile Goal: Diagnostic test results will improve 02/20/2019 1620 by Etheleen Nicks, RN Outcome: Adequate for Discharge 02/20/2019 1033 by Etheleen Nicks, RN Outcome: Progressing Note: BNP 780, Troponins 12,16,21,19 BUN 32/1.10 Goal: Respiratory complications will improve 02/20/2019 1620 by Etheleen Nicks, RN Outcome: Adequate for Discharge 02/20/2019 1033 by Etheleen Nicks, RN Outcome: Progressing Goal: Cardiovascular complication will be avoided 02/20/2019 1620 by Etheleen Nicks, RN Outcome: Adequate for Discharge 02/20/2019 1033 by Etheleen Nicks, RN Outcome: Progressing Note: No chest pain or arrhythmias   Problem: Activity: Goal: Risk for activity intolerance will decrease Outcome: Adequate for Discharge   Problem: Nutrition: Goal: Adequate nutrition will be maintained Outcome: Adequate for Discharge   Problem: Coping: Goal: Level of anxiety will decrease Outcome: Adequate for Discharge   Problem:  Elimination: Goal: Will not experience complications related to bowel motility Outcome: Adequate for Discharge Goal: Will not experience complications related to urinary retention Outcome: Adequate for Discharge   Problem: Pain Managment: Goal: General experience of comfort will improve Outcome: Adequate for Discharge   Problem: Safety: Goal: Ability to remain free from injury will improve Outcome: Adequate for Discharge   Problem: Skin Integrity: Goal: Risk for impaired skin integrity will decrease Outcome: Adequate for Discharge

## 2019-02-20 NOTE — Consult Note (Addendum)
[facility]  CARDIOLOGY CONSULT NOTE  Patient ID: Anita Ward MRN: [id number] DOB/AGE: 83/02/1920 83 y.o.  Admit date: 02/19/2019 Referring Physician Dr. Imogene Burnhen Primary Physician Dr. Marcello FennelHande Primary Cardiologist Dr. Gwen PoundsKowalski Reason for Consultation Chest pain / Shortness of Breath  HPI:  Anita SalinesDorothy H Ward is a 85 y.o. with a past medical history of coronary artery disease s/p CABG in 2004, recent NSTEMI versus demand ischemic in 01/2019, now on dual antiplatelet therapy, HFpEF, and HTN, who presented to the ED yesterday following an episode of chest discomfort and shortness of breath that occurred when walking to her car from lunch yesterday afternoon. The pain was described as a tightness in her chest - lasting about five minutes and then resolving spontaneously. She states that her cousin convinced her to come to the ER since she wouldn't have otherwise. She has not had any other chest pain since that time. She denies any difficulty breathing now. She denies any recent leg swelling, lightheadedness, syncope, or palpitations.   ED course with two high sensitivity troponins that were within normal limits. CXR with bilateral pleural effusions. BNP of 780.   Review of systems complete and found to be negative unless listed above   Past Medical History:  Diagnosis Date  . Anxiety   . Congestive heart failure (CHF) (HCC)   . Coronary artery disease   . Glaucoma   . HTN (hypertension)   . Osteoporosis     Past Surgical History:  Procedure Laterality Date  . CARDIAC SURGERY    . CARDIAC SURGERY    . CORONARY ARTERY BYPASS GRAFT      Medications Prior to Admission  Medication Sig Dispense Refill Last Dose  . ALPRAZolam (XANAX) 0.25 MG tablet Take 0.25 mg by mouth 4 (four) times daily.   02/19/2019 at 0900  . aspirin EC 81 MG tablet Take 81 mg by mouth daily.   02/19/2019 at 0900  . Calcium Carbonate-Vitamin D (CALCIUM 600+D) 600-400 MG-UNIT tablet Take 1 tablet by mouth 2 (two) times daily.    02/19/2019 at 0900  . cholecalciferol (VITAMIN D) 1000 UNITS tablet Take 1,000 Units by mouth every morning.    02/19/2019 at 0900  . cloNIDine (CATAPRES) 0.3 MG tablet Take 1 tablet (0.3 mg total) by mouth 2 (two) times daily. 60 tablet 11 02/19/2019 at 0900  . clopidogrel (PLAVIX) 75 MG tablet Take 1 tablet (75 mg total) by mouth daily with breakfast. 30 tablet 0 02/19/2019 at 0900  . CRANBERRY PO Take 1 capsule by mouth every morning.    02/19/2019 at 0900  . famotidine (PEPCID) 40 MG tablet Take 40 mg by mouth daily.   02/19/2019 at 0900  . ferrous sulfate 325 (65 FE) MG tablet Take 325 mg by mouth daily with breakfast.   02/19/2019 at 0900  . furosemide (LASIX) 40 MG tablet Take 1 tablet (40 mg total) by mouth at bedtime. 15 tablet 0 02/18/2019 at 2000  . hydrALAZINE (APRESOLINE) 50 MG tablet Take 1 tablet (50 mg total) by mouth every 6 (six) hours. 80 tablet 0 02/19/2019 at 0900  . isosorbide mononitrate (IMDUR) 30 MG 24 hr tablet Take 30 mg by mouth daily.   02/19/2019 at 0900  . levothyroxine (SYNTHROID) 50 MCG tablet Take 50 mcg by mouth daily before breakfast. Take on an empty stomach with a glass of water at least 30 to 60 minutes before breakfast   02/19/2019 at 0730  . losartan (COZAAR) 100 MG tablet Take 1 tablet (100 mg  total) by mouth at bedtime. 60 tablet 1 02/18/2019 at 2000  . Magnesium 200 MG TABS Take 200 mg by mouth 2 (two) times daily.   02/19/2019 at 0900  . metoprolol tartrate (LOPRESSOR) 25 MG tablet Take 25 mg by mouth 2 (two) times a day.   02/19/2019 at 0900  . Potassium Chloride ER 20 MEQ TBCR Take 20 mEq by mouth every morning.    02/19/2019 at 0900  . rosuvastatin (CRESTOR) 20 MG tablet Take 1 tablet (20 mg total) by mouth daily at 6 PM. 30 tablet 0 02/18/2019 at 1800  . vitamin B-12 (CYANOCOBALAMIN) 1000 MCG tablet Take 1,000 mcg by mouth every morning.    02/19/2019 at 0900  . clotrimazole-betamethasone (LOTRISONE) cream Apply 1 application topically 2 (two) times daily as needed.     unknown at prn  . nitroGLYCERIN (NITROSTAT) 0.4 MG SL tablet Place 0.4 mg under the tongue every 5 (five) minutes x 3 doses as needed for chest pain.    unknown at prn  . traMADol (ULTRAM) 50 MG tablet Take 50 mg by mouth 3 (three) times daily as needed for moderate pain.   unknown at prn   Social History   Socioeconomic History  . Marital status: Widowed    Spouse name: Not on file  . Number of children: Not on file  . Years of education: Not on file  . Highest education level: Not on file  Occupational History  . Not on file  Social Needs  . Financial resource strain: Not hard at all  . Food insecurity    Worry: Never true    Inability: Never true  . Transportation needs    Medical: No    Non-medical: No  Tobacco Use  . Smoking status: Never Smoker  . Smokeless tobacco: Never Used  Substance and Sexual Activity  . Alcohol use: No  . Drug use: No  . Sexual activity: Never  Lifestyle  . Physical activity    Days per week: Patient refused    Minutes per session: Patient refused  . Stress: Not at all  Relationships  . Social Herbalist on phone: Patient refused    Gets together: Patient refused    Attends religious service: Patient refused    Active member of club or organization: Patient refused    Attends meetings of clubs or organizations: Patient refused    Relationship status: Patient refused  . Intimate partner violence    Fear of current or ex partner: Patient refused    Emotionally abused: Patient refused    Physically abused: Patient refused    Forced sexual activity: Patient refused  Other Topics Concern  . Not on file  Social History Narrative  . Not on file    Family History  Problem Relation Age of Onset  . Congestive Heart Failure Father   . CAD Brother     Review of systems complete and found to be negative unless listed above    PHYSICAL EXAM  General: Well developed, well nourished, in no acute distress HEENT:  Normocephalic  and atramatic Neck:  No JVD.  Lungs: No respiratory distress. Speaking in full sentences. Crackles at bilateral bases  Heart: HRRR. Normal S1 and S2 without gallops or murmurs.  Extremities: No clubbing, cyanosis or edema.   Neuro: Alert and oriented X 3. Mild cognitive impairment - repeating same questions several times during visit.  Psych:  Good affect, responds appropriately  Labs:   Lab Results  Component Value Date   WBC 11.3 (H) 02/19/2019   HGB 11.7 (L) 02/19/2019   HCT 36.7 02/19/2019   MCV 83.2 02/19/2019   PLT 261 02/19/2019    Recent Labs  Lab 02/19/19 1331  NA 137  K 4.1  CL 104  CO2 23  BUN 32*  CREATININE 1.10*  CALCIUM 9.0  GLUCOSE 134*   Lab Results  Component Value Date   CKTOTAL 63 05/26/2012   CKMB 1.3 05/26/2012   TROPONINI <0.03 12/25/2017    Lab Results  Component Value Date   CHOL 84 02/19/2019   CHOL 120 01/13/2019   CHOL 108 01/13/2019   Lab Results  Component Value Date   HDL 37 (L) 02/19/2019   HDL 44 01/13/2019   HDL 40 (L) 01/13/2019   Lab Results  Component Value Date   LDLCALC 38 02/19/2019   LDLCALC 59 01/13/2019   LDLCALC 52 01/13/2019   Lab Results  Component Value Date   TRIG 46 02/19/2019   TRIG 87 01/13/2019   TRIG 80 01/13/2019   Lab Results  Component Value Date   CHOLHDL 2.3 02/19/2019   CHOLHDL 2.7 01/13/2019   CHOLHDL 2.7 01/13/2019   No results found for: LDLDIRECT    Radiology: Dg Chest 2 View  Result Date: 02/19/2019 CLINICAL DATA:  Shortness of breath EXAM: CHEST - 2 VIEW COMPARISON:  December 25, 2017 FINDINGS: The heart size remains enlarged. Aortic calcifications are noted. The patient is status post prior median sternotomy. There are chronic appearing airspace opacities at the lung bases bilaterally. There is no pneumothorax. There appear to be new small bilateral pleural effusions. There is no acute osseous abnormality. There is diffuse osteopenia involving the thoracic spine. There is height loss of  several lower thoracic vertebral bodies. IMPRESSION: 1. Cardiomegaly with small bilateral pleural effusions. There is likely some mild vascular congestion. 2. Chronic lung changes at the lung bases bilaterally favored to represent scarring or atelectasis. Electronically Signed   By: Katherine Mantle M.D.   On: 02/19/2019 14:30    EKG: sinus rhythm, rate of 66 BPM, left axis deviation, LBBB (seen on prior EKGs), no significant ST-T wave changes   ASSESSMENT AND PLAN:  Ms. Damien is a 85 year old with a history of coronary artery disease s/p CABG in 2004, recent NSTEMI versus demand ischemic in 01/2019, now on dual antiplatelet therapy, HFpEF, and HTN, who presented to the ED following a brief episode of chest pain and shortness of breath yesterday afternoon. Two high sensitivity troponins were within normal limits. Patient is chest pain free at this time. She does have some mild crackles and signs of pleural effusions on her CXR suggestive of possible fluid overload but patient without any change in breathing currently. Continue with PO dose of diuretic for fluid management.   1. Given two negative high sensitivity troponins, no changes to EKG, and no active chest pain - no further intervention or diagnostics indicated at this time.  2. Close with dual antiplatelet therapy as an outpatient  3. Close outpatient follow up with Dr. Gwen Pounds in 1-2 weeks    The patient's history and exam findings were discussed with Dr. Gwen Pounds. The plan was made in conjunction with Dr. Gwen Pounds.  Signed: Harrell Gave PA-C 02/20/2019, 9:52 AM  The patient has been interviewed and examined. I agree with assessment and plan above. Arnoldo Hooker MD [facility]

## 2019-03-15 ENCOUNTER — Emergency Department: Payer: Medicare Other

## 2019-03-15 ENCOUNTER — Encounter: Payer: Self-pay | Admitting: Emergency Medicine

## 2019-03-15 ENCOUNTER — Emergency Department
Admission: EM | Admit: 2019-03-15 | Discharge: 2019-03-16 | Disposition: A | Discharge status: Other Acute Inpatient Hospital | Payer: Medicare Other | Attending: Emergency Medicine | Admitting: Emergency Medicine

## 2019-03-15 ENCOUNTER — Other Ambulatory Visit: Payer: Self-pay

## 2019-03-15 DIAGNOSIS — I2510 Atherosclerotic heart disease of native coronary artery without angina pectoris: Secondary | ICD-10-CM | POA: Diagnosis not present

## 2019-03-15 DIAGNOSIS — R0902 Hypoxemia: Secondary | ICD-10-CM

## 2019-03-15 DIAGNOSIS — Z951 Presence of aortocoronary bypass graft: Secondary | ICD-10-CM | POA: Insufficient documentation

## 2019-03-15 DIAGNOSIS — Z7901 Long term (current) use of anticoagulants: Secondary | ICD-10-CM | POA: Diagnosis not present

## 2019-03-15 DIAGNOSIS — U071 COVID-19: Secondary | ICD-10-CM | POA: Insufficient documentation

## 2019-03-15 DIAGNOSIS — N390 Urinary tract infection, site not specified: Secondary | ICD-10-CM | POA: Insufficient documentation

## 2019-03-15 DIAGNOSIS — Z20828 Contact with and (suspected) exposure to other viral communicable diseases: Secondary | ICD-10-CM | POA: Diagnosis not present

## 2019-03-15 DIAGNOSIS — R410 Disorientation, unspecified: Secondary | ICD-10-CM | POA: Diagnosis not present

## 2019-03-15 DIAGNOSIS — I509 Heart failure, unspecified: Secondary | ICD-10-CM | POA: Diagnosis not present

## 2019-03-15 DIAGNOSIS — I110 Hypertensive heart disease with heart failure: Secondary | ICD-10-CM | POA: Diagnosis not present

## 2019-03-15 DIAGNOSIS — Z79899 Other long term (current) drug therapy: Secondary | ICD-10-CM | POA: Insufficient documentation

## 2019-03-15 DIAGNOSIS — Z7982 Long term (current) use of aspirin: Secondary | ICD-10-CM | POA: Insufficient documentation

## 2019-03-15 DIAGNOSIS — R509 Fever, unspecified: Secondary | ICD-10-CM | POA: Diagnosis present

## 2019-03-15 LAB — URINALYSIS, COMPLETE (UACMP) WITH MICROSCOPIC
Bilirubin Urine: NEGATIVE
Glucose, UA: NEGATIVE mg/dL
Hgb urine dipstick: NEGATIVE
Ketones, ur: NEGATIVE mg/dL
Nitrite: NEGATIVE
Protein, ur: 100 mg/dL — AB
Specific Gravity, Urine: 1.017 (ref 1.005–1.030)
Squamous Epithelial / HPF: >50 — ABNORMAL HIGH (ref 0–5)
pH: 7 (ref 5.0–8.0)

## 2019-03-15 LAB — CBC WITH DIFFERENTIAL/PLATELET
Abs Immature Granulocytes: 0.05 10*3/uL (ref 0.00–0.07)
Basophils Absolute: 0 10*3/uL (ref 0.0–0.1)
Basophils Relative: 0 %
Eosinophils Absolute: 0 10*3/uL (ref 0.0–0.5)
Eosinophils Relative: 0 %
HCT: 37.5 % (ref 36.0–46.0)
Hemoglobin: 12.4 g/dL (ref 12.0–15.0)
Immature Granulocytes: 1 %
Lymphocytes Relative: 9 %
Lymphs Abs: 0.9 10*3/uL (ref 0.7–4.0)
MCH: 26.3 pg (ref 26.0–34.0)
MCHC: 33.1 g/dL (ref 30.0–36.0)
MCV: 79.6 fL — ABNORMAL LOW (ref 80.0–100.0)
Monocytes Absolute: 0.5 10*3/uL (ref 0.1–1.0)
Monocytes Relative: 5 %
Neutro Abs: 8.5 10*3/uL — ABNORMAL HIGH (ref 1.7–7.7)
Neutrophils Relative %: 85 %
Platelets: 167 10*3/uL (ref 150–400)
RBC: 4.71 MIL/uL (ref 3.87–5.11)
RDW: 14.6 % (ref 11.5–15.5)
WBC: 9.9 10*3/uL (ref 4.0–10.5)
nRBC: 0 % (ref 0.0–0.2)

## 2019-03-15 LAB — COMPREHENSIVE METABOLIC PANEL
ALT: 24 U/L (ref 0–44)
AST: 47 U/L — ABNORMAL HIGH (ref 15–41)
Albumin: 3.3 g/dL — ABNORMAL LOW (ref 3.5–5.0)
Alkaline Phosphatase: 35 U/L — ABNORMAL LOW (ref 38–126)
Anion gap: 11 (ref 5–15)
BUN: 32 mg/dL — ABNORMAL HIGH (ref 8–23)
CO2: 23 mmol/L (ref 22–32)
Calcium: 8.6 mg/dL — ABNORMAL LOW (ref 8.9–10.3)
Chloride: 98 mmol/L (ref 98–111)
Creatinine, Ser: 1.22 mg/dL — ABNORMAL HIGH (ref 0.44–1.00)
GFR calc Af Amer: 43 mL/min — ABNORMAL LOW (ref >60)
GFR calc non Af Amer: 37 mL/min — ABNORMAL LOW (ref >60)
Glucose, Bld: 122 mg/dL — ABNORMAL HIGH (ref 70–99)
Potassium: 4.6 mmol/L (ref 3.5–5.1)
Sodium: 132 mmol/L — ABNORMAL LOW (ref 135–145)
Total Bilirubin: 0.7 mg/dL (ref 0.3–1.2)
Total Protein: 7.3 g/dL (ref 6.5–8.1)

## 2019-03-15 LAB — BLOOD GAS, VENOUS
Acid-Base Excess: 6.6 mmol/L — ABNORMAL HIGH (ref 0.0–2.0)
Bicarbonate: 29.1 mmol/L — ABNORMAL HIGH (ref 20.0–28.0)
O2 Saturation: 89.2 %
Patient temperature: 37
pCO2, Ven: 34 mmHg — ABNORMAL LOW (ref 44.0–60.0)
pH, Ven: 7.54 — ABNORMAL HIGH (ref 7.250–7.430)
pO2, Ven: 49 mmHg — ABNORMAL HIGH (ref 32.0–45.0)

## 2019-03-15 LAB — SARS CORONAVIRUS 2 BY RT PCR (HOSPITAL ORDER, PERFORMED IN ~~LOC~~ HOSPITAL LAB): SARS Coronavirus 2: POSITIVE — AB

## 2019-03-15 LAB — PROTIME-INR
INR: 1.1 (ref 0.8–1.2)
Prothrombin Time: 14.3 seconds (ref 11.4–15.2)

## 2019-03-15 LAB — LACTIC ACID, PLASMA
Lactic Acid, Venous: 1.3 mmol/L (ref 0.5–1.9)
Lactic Acid, Venous: 1.4 mmol/L (ref 0.5–1.9)

## 2019-03-15 MED ORDER — SODIUM CHLORIDE 0.9 % IV BOLUS
1000.0000 mL | Freq: Once | INTRAVENOUS | Status: AC
  Administered 2019-03-15: 1000 mL via INTRAVENOUS

## 2019-03-15 MED ORDER — DEXAMETHASONE SODIUM PHOSPHATE 10 MG/ML IJ SOLN
10.0000 mg | Freq: Once | INTRAMUSCULAR | Status: AC
  Administered 2019-03-15: 10 mg via INTRAVENOUS
  Filled 2019-03-15: qty 1

## 2019-03-15 MED ORDER — SODIUM CHLORIDE 0.9 % IV SOLN
1.0000 g | Freq: Once | INTRAVENOUS | Status: AC
  Administered 2019-03-15: 22:00:00 1 g via INTRAVENOUS
  Filled 2019-03-15: qty 10

## 2019-03-15 MED ORDER — ACETAMINOPHEN 500 MG PO TABS
1000.0000 mg | ORAL_TABLET | Freq: Once | ORAL | Status: AC
  Administered 2019-03-15: 1000 mg via ORAL
  Filled 2019-03-15: qty 2

## 2019-03-15 NOTE — ED Notes (Signed)
MD Archie Balboa made aware of positive Covid swab.

## 2019-03-15 NOTE — ED Notes (Signed)
Duanna Runk [phone].

## 2019-03-15 NOTE — ED Triage Notes (Signed)
Pt arrives via [facility] with c/o fever, urine odor, AMS, and hypoxia. Per EMS, pt has oral temp 102.3, CBG 136, and other VS WDL. Pt was exposed to her hairdresser who tested positive for Covid.

## 2019-03-16 ENCOUNTER — Encounter (HOSPITAL_COMMUNITY): Payer: Self-pay

## 2019-03-16 ENCOUNTER — Inpatient Hospital Stay (HOSPITAL_COMMUNITY)
Admission: AD | Admit: 2019-03-16 | Discharge: 2019-04-10 | DRG: 177 | Disposition: E | Payer: Medicare Other | Source: Other Acute Inpatient Hospital | Attending: Internal Medicine | Admitting: Internal Medicine

## 2019-03-16 ENCOUNTER — Inpatient Hospital Stay (HOSPITAL_COMMUNITY): Payer: Medicare Other

## 2019-03-16 DIAGNOSIS — G9341 Metabolic encephalopathy: Secondary | ICD-10-CM | POA: Diagnosis present

## 2019-03-16 DIAGNOSIS — B961 Klebsiella pneumoniae [K. pneumoniae] as the cause of diseases classified elsewhere: Secondary | ICD-10-CM | POA: Diagnosis present

## 2019-03-16 DIAGNOSIS — Z8249 Family history of ischemic heart disease and other diseases of the circulatory system: Secondary | ICD-10-CM | POA: Diagnosis not present

## 2019-03-16 DIAGNOSIS — N183 Chronic kidney disease, stage 3 unspecified: Secondary | ICD-10-CM | POA: Diagnosis present

## 2019-03-16 DIAGNOSIS — Z881 Allergy status to other antibiotic agents status: Secondary | ICD-10-CM | POA: Diagnosis not present

## 2019-03-16 DIAGNOSIS — Z882 Allergy status to sulfonamides status: Secondary | ICD-10-CM | POA: Diagnosis not present

## 2019-03-16 DIAGNOSIS — K219 Gastro-esophageal reflux disease without esophagitis: Secondary | ICD-10-CM | POA: Diagnosis present

## 2019-03-16 DIAGNOSIS — J9601 Acute respiratory failure with hypoxia: Secondary | ICD-10-CM

## 2019-03-16 DIAGNOSIS — Z515 Encounter for palliative care: Secondary | ICD-10-CM | POA: Diagnosis present

## 2019-03-16 DIAGNOSIS — I130 Hypertensive heart and chronic kidney disease with heart failure and stage 1 through stage 4 chronic kidney disease, or unspecified chronic kidney disease: Secondary | ICD-10-CM | POA: Diagnosis present

## 2019-03-16 DIAGNOSIS — N390 Urinary tract infection, site not specified: Secondary | ICD-10-CM | POA: Diagnosis present

## 2019-03-16 DIAGNOSIS — J1282 Pneumonia due to coronavirus disease 2019: Secondary | ICD-10-CM | POA: Diagnosis present

## 2019-03-16 DIAGNOSIS — E039 Hypothyroidism, unspecified: Secondary | ICD-10-CM | POA: Diagnosis present

## 2019-03-16 DIAGNOSIS — Z7902 Long term (current) use of antithrombotics/antiplatelets: Secondary | ICD-10-CM

## 2019-03-16 DIAGNOSIS — I10 Essential (primary) hypertension: Secondary | ICD-10-CM | POA: Diagnosis present

## 2019-03-16 DIAGNOSIS — I5032 Chronic diastolic (congestive) heart failure: Secondary | ICD-10-CM | POA: Diagnosis present

## 2019-03-16 DIAGNOSIS — J1289 Other viral pneumonia: Secondary | ICD-10-CM | POA: Diagnosis present

## 2019-03-16 DIAGNOSIS — M810 Age-related osteoporosis without current pathological fracture: Secondary | ICD-10-CM | POA: Diagnosis present

## 2019-03-16 DIAGNOSIS — Z7982 Long term (current) use of aspirin: Secondary | ICD-10-CM

## 2019-03-16 DIAGNOSIS — Z880 Allergy status to penicillin: Secondary | ICD-10-CM

## 2019-03-16 DIAGNOSIS — I2510 Atherosclerotic heart disease of native coronary artery without angina pectoris: Secondary | ICD-10-CM | POA: Diagnosis present

## 2019-03-16 DIAGNOSIS — H409 Unspecified glaucoma: Secondary | ICD-10-CM | POA: Diagnosis present

## 2019-03-16 DIAGNOSIS — U071 COVID-19: Principal | ICD-10-CM | POA: Diagnosis present

## 2019-03-16 DIAGNOSIS — F0390 Unspecified dementia without behavioral disturbance: Secondary | ICD-10-CM | POA: Diagnosis present

## 2019-03-16 DIAGNOSIS — R0602 Shortness of breath: Secondary | ICD-10-CM | POA: Diagnosis present

## 2019-03-16 DIAGNOSIS — Z66 Do not resuscitate: Secondary | ICD-10-CM | POA: Diagnosis not present

## 2019-03-16 DIAGNOSIS — F419 Anxiety disorder, unspecified: Secondary | ICD-10-CM | POA: Diagnosis present

## 2019-03-16 DIAGNOSIS — Z951 Presence of aortocoronary bypass graft: Secondary | ICD-10-CM

## 2019-03-16 DIAGNOSIS — Z888 Allergy status to other drugs, medicaments and biological substances status: Secondary | ICD-10-CM

## 2019-03-16 DIAGNOSIS — H9190 Unspecified hearing loss, unspecified ear: Secondary | ICD-10-CM | POA: Diagnosis present

## 2019-03-16 DIAGNOSIS — R451 Restlessness and agitation: Secondary | ICD-10-CM | POA: Diagnosis not present

## 2019-03-16 DIAGNOSIS — Z79899 Other long term (current) drug therapy: Secondary | ICD-10-CM

## 2019-03-16 DIAGNOSIS — Z79890 Hormone replacement therapy (postmenopausal): Secondary | ICD-10-CM

## 2019-03-16 DIAGNOSIS — R140 Abdominal distension (gaseous): Secondary | ICD-10-CM

## 2019-03-16 LAB — COMPREHENSIVE METABOLIC PANEL
ALT: 24 U/L (ref 0–44)
AST: 44 U/L — ABNORMAL HIGH (ref 15–41)
Albumin: 2.8 g/dL — ABNORMAL LOW (ref 3.5–5.0)
Alkaline Phosphatase: 36 U/L — ABNORMAL LOW (ref 38–126)
Anion gap: 13 (ref 5–15)
BUN: 29 mg/dL — ABNORMAL HIGH (ref 8–23)
CO2: 24 mmol/L (ref 22–32)
Calcium: 8.3 mg/dL — ABNORMAL LOW (ref 8.9–10.3)
Chloride: 101 mmol/L (ref 98–111)
Creatinine, Ser: 1.14 mg/dL — ABNORMAL HIGH (ref 0.44–1.00)
GFR calc Af Amer: 47 mL/min — ABNORMAL LOW (ref >60)
GFR calc non Af Amer: 40 mL/min — ABNORMAL LOW (ref >60)
Glucose, Bld: 147 mg/dL — ABNORMAL HIGH (ref 70–99)
Potassium: 3.9 mmol/L (ref 3.5–5.1)
Sodium: 138 mmol/L (ref 135–145)
Total Bilirubin: 0.4 mg/dL (ref 0.3–1.2)
Total Protein: 6.9 g/dL (ref 6.5–8.1)

## 2019-03-16 LAB — CBC WITH DIFFERENTIAL/PLATELET
Abs Immature Granulocytes: 0.04 10*3/uL (ref 0.00–0.07)
Basophils Absolute: 0 10*3/uL (ref 0.0–0.1)
Basophils Relative: 0 %
Eosinophils Absolute: 0 10*3/uL (ref 0.0–0.5)
Eosinophils Relative: 0 %
HCT: 40 % (ref 36.0–46.0)
Hemoglobin: 12.7 g/dL (ref 12.0–15.0)
Immature Granulocytes: 1 %
Lymphocytes Relative: 10 %
Lymphs Abs: 0.6 10*3/uL — ABNORMAL LOW (ref 0.7–4.0)
MCH: 26.3 pg (ref 26.0–34.0)
MCHC: 31.8 g/dL (ref 30.0–36.0)
MCV: 83 fL (ref 80.0–100.0)
Monocytes Absolute: 0.2 10*3/uL (ref 0.1–1.0)
Monocytes Relative: 3 %
Neutro Abs: 4.7 10*3/uL (ref 1.7–7.7)
Neutrophils Relative %: 86 %
Platelets: 138 10*3/uL — ABNORMAL LOW (ref 150–400)
RBC: 4.82 MIL/uL (ref 3.87–5.11)
RDW: 14.7 % (ref 11.5–15.5)
WBC: 5.5 10*3/uL (ref 4.0–10.5)
nRBC: 0 % (ref 0.0–0.2)

## 2019-03-16 LAB — C-REACTIVE PROTEIN: CRP: 14.5 mg/dL — ABNORMAL HIGH (ref <1.0)

## 2019-03-16 LAB — ABO/RH: ABO/RH(D): O POS

## 2019-03-16 LAB — FERRITIN: Ferritin: 717 ng/mL — ABNORMAL HIGH (ref 11–307)

## 2019-03-16 LAB — D-DIMER, QUANTITATIVE: D-Dimer, Quant: 2.26 ug/mL-FEU — ABNORMAL HIGH (ref 0.00–0.50)

## 2019-03-16 LAB — PROCALCITONIN: Procalcitonin: 0.2 ng/mL

## 2019-03-16 LAB — MAGNESIUM: Magnesium: 2.3 mg/dL (ref 1.7–2.4)

## 2019-03-16 MED ORDER — POTASSIUM CHLORIDE CRYS ER 20 MEQ PO TBCR
20.0000 meq | EXTENDED_RELEASE_TABLET | Freq: Every day | ORAL | Status: DC
  Administered 2019-03-16 – 2019-03-17 (×2): 20 meq via ORAL
  Filled 2019-03-16 (×2): qty 1

## 2019-03-16 MED ORDER — DEXAMETHASONE 6 MG PO TABS: 6.0000 mg | ORAL_TABLET | ORAL | Status: DC

## 2019-03-16 MED ORDER — FERROUS SULFATE 325 (65 FE) MG PO TABS
325.0000 mg | ORAL_TABLET | Freq: Every day | ORAL | Status: DC
  Administered 2019-03-16 – 2019-03-18 (×3): 325 mg via ORAL
  Filled 2019-03-16 (×3): qty 1

## 2019-03-16 MED ORDER — ONDANSETRON HCL 4 MG/2ML IJ SOLN: 4.0000 mg | Freq: Four times a day (QID) | INTRAMUSCULAR | Status: DC | PRN

## 2019-03-16 MED ORDER — ALPRAZOLAM 0.25 MG PO TABS
0.2500 mg | ORAL_TABLET | Freq: Four times a day (QID) | ORAL | Status: DC
  Administered 2019-03-16 – 2019-03-18 (×9): 0.25 mg via ORAL
  Filled 2019-03-16 (×9): qty 1

## 2019-03-16 MED ORDER — METHYLPREDNISOLONE SODIUM SUCC 40 MG IJ SOLR
40.0000 mg | Freq: Three times a day (TID) | INTRAMUSCULAR | Status: DC
  Administered 2019-03-16 – 2019-03-18 (×7): 40 mg via INTRAVENOUS
  Filled 2019-03-16 (×7): qty 1

## 2019-03-16 MED ORDER — SODIUM CHLORIDE 0.9 % IV SOLN
1.0000 g | INTRAVENOUS | Status: DC
  Administered 2019-03-16 – 2019-03-17 (×2): 1 g via INTRAVENOUS
  Filled 2019-03-16 (×2): qty 10

## 2019-03-16 MED ORDER — METOPROLOL TARTRATE 25 MG PO TABS
25.0000 mg | ORAL_TABLET | Freq: Two times a day (BID) | ORAL | Status: DC
  Administered 2019-03-16 – 2019-03-18 (×5): 25 mg via ORAL
  Filled 2019-03-16 (×5): qty 1

## 2019-03-16 MED ORDER — VITAMIN B-12 1000 MCG PO TABS
1000.0000 ug | ORAL_TABLET | ORAL | Status: DC
  Administered 2019-03-16 – 2019-03-18 (×3): 1000 ug via ORAL
  Filled 2019-03-16 (×3): qty 1

## 2019-03-16 MED ORDER — SODIUM CHLORIDE 0.9 % IV SOLN
200.0000 mg | Freq: Once | INTRAVENOUS | Status: AC
  Administered 2019-03-16: 200 mg via INTRAVENOUS
  Filled 2019-03-16: qty 40

## 2019-03-16 MED ORDER — PHENOL 1.4 % MT LIQD
1.0000 | OROMUCOSAL | Status: DC | PRN
  Filled 2019-03-16: qty 177

## 2019-03-16 MED ORDER — FUROSEMIDE 20 MG PO TABS
40.0000 mg | ORAL_TABLET | Freq: Every day | ORAL | Status: DC
  Administered 2019-03-16 – 2019-03-17 (×2): 40 mg via ORAL
  Filled 2019-03-16 (×2): qty 2

## 2019-03-16 MED ORDER — ONDANSETRON HCL 4 MG PO TABS: 4.0000 mg | ORAL_TABLET | Freq: Four times a day (QID) | ORAL | Status: DC | PRN

## 2019-03-16 MED ORDER — CLOPIDOGREL BISULFATE 75 MG PO TABS
75.0000 mg | ORAL_TABLET | Freq: Every day | ORAL | Status: DC
  Administered 2019-03-16 – 2019-03-18 (×3): 75 mg via ORAL
  Filled 2019-03-16 (×3): qty 1

## 2019-03-16 MED ORDER — LABETALOL HCL 5 MG/ML IV SOLN
10.0000 mg | INTRAVENOUS | Status: DC | PRN
  Administered 2019-03-16 – 2019-03-18 (×3): 10 mg via INTRAVENOUS
  Filled 2019-03-16 (×3): qty 4

## 2019-03-16 MED ORDER — ORAL CARE MOUTH RINSE
15.0000 mL | Freq: Two times a day (BID) | OROMUCOSAL | Status: DC
  Administered 2019-03-16 – 2019-03-18 (×5): 15 mL via OROMUCOSAL

## 2019-03-16 MED ORDER — ENOXAPARIN SODIUM 40 MG/0.4ML ~~LOC~~ SOLN: 40.0000 mg | SUBCUTANEOUS | Status: DC

## 2019-03-16 MED ORDER — MAGNESIUM OXIDE 400 (241.3 MG) MG PO TABS
400.0000 mg | ORAL_TABLET | Freq: Two times a day (BID) | ORAL | Status: DC
  Administered 2019-03-16 – 2019-03-18 (×5): 400 mg via ORAL
  Filled 2019-03-16 (×5): qty 1

## 2019-03-16 MED ORDER — ISOSORBIDE MONONITRATE ER 30 MG PO TB24
30.0000 mg | ORAL_TABLET | Freq: Every day | ORAL | Status: DC
  Administered 2019-03-16 – 2019-03-18 (×3): 30 mg via ORAL
  Filled 2019-03-16 (×3): qty 1

## 2019-03-16 MED ORDER — CLONIDINE HCL 0.1 MG PO TABS: 0.3000 mg | ORAL_TABLET | Freq: Two times a day (BID) | ORAL | Status: DC

## 2019-03-16 MED ORDER — HYDRALAZINE HCL 50 MG PO TABS
50.0000 mg | ORAL_TABLET | Freq: Three times a day (TID) | ORAL | Status: DC
  Administered 2019-03-16: 10:00:00 50 mg via ORAL
  Filled 2019-03-16: qty 1

## 2019-03-16 MED ORDER — HEPARIN SODIUM (PORCINE) 5000 UNIT/ML IJ SOLN
5000.0000 [IU] | Freq: Three times a day (TID) | INTRAMUSCULAR | Status: DC
  Administered 2019-03-16 – 2019-03-18 (×7): 5000 [IU] via SUBCUTANEOUS
  Filled 2019-03-16 (×7): qty 1

## 2019-03-16 MED ORDER — HYDRALAZINE HCL 20 MG/ML IJ SOLN
5.0000 mg | INTRAMUSCULAR | Status: DC | PRN
  Administered 2019-03-16: 5 mg via INTRAVENOUS
  Filled 2019-03-16: qty 1

## 2019-03-16 MED ORDER — CLONIDINE HCL 0.1 MG PO TABS
0.3000 mg | ORAL_TABLET | Freq: Two times a day (BID) | ORAL | Status: DC
  Administered 2019-03-16 – 2019-03-18 (×5): 0.3 mg via ORAL
  Filled 2019-03-16 (×5): qty 3

## 2019-03-16 MED ORDER — TRAMADOL HCL 50 MG PO TABS
50.0000 mg | ORAL_TABLET | Freq: Three times a day (TID) | ORAL | Status: DC | PRN
  Administered 2019-03-16: 50 mg via ORAL
  Filled 2019-03-16: qty 1

## 2019-03-16 MED ORDER — PANTOPRAZOLE SODIUM 40 MG PO TBEC
40.0000 mg | DELAYED_RELEASE_TABLET | Freq: Every day | ORAL | Status: DC
  Administered 2019-03-16 – 2019-03-18 (×3): 40 mg via ORAL
  Filled 2019-03-16 (×3): qty 1

## 2019-03-16 MED ORDER — CALCIUM CARBONATE-VITAMIN D 500-200 MG-UNIT PO TABS
1.0000 | ORAL_TABLET | Freq: Two times a day (BID) | ORAL | Status: DC
  Administered 2019-03-16 – 2019-03-18 (×5): 1 via ORAL
  Filled 2019-03-16 (×5): qty 1

## 2019-03-16 MED ORDER — ACETAMINOPHEN 325 MG PO TABS: 650.0000 mg | ORAL_TABLET | Freq: Four times a day (QID) | ORAL | Status: DC | PRN

## 2019-03-16 MED ORDER — HYDRALAZINE HCL 50 MG PO TABS
100.0000 mg | ORAL_TABLET | Freq: Three times a day (TID) | ORAL | Status: DC
  Administered 2019-03-16 – 2019-03-18 (×6): 100 mg via ORAL
  Filled 2019-03-16 (×6): qty 2

## 2019-03-16 MED ORDER — LOSARTAN POTASSIUM 25 MG PO TABS
100.0000 mg | ORAL_TABLET | Freq: Every day | ORAL | Status: DC
  Administered 2019-03-16 – 2019-03-17 (×2): 100 mg via ORAL
  Filled 2019-03-16 (×2): qty 4

## 2019-03-16 MED ORDER — ALBUTEROL SULFATE HFA 108 (90 BASE) MCG/ACT IN AERS
2.0000 | INHALATION_SPRAY | Freq: Four times a day (QID) | RESPIRATORY_TRACT | Status: DC
  Administered 2019-03-16 – 2019-03-18 (×10): 2 via RESPIRATORY_TRACT
  Filled 2019-03-16: qty 6.7

## 2019-03-16 MED ORDER — HYDRALAZINE HCL 20 MG/ML IJ SOLN
10.0000 mg | INTRAMUSCULAR | Status: DC | PRN
  Administered 2019-03-16 – 2019-03-17 (×2): 10 mg via INTRAVENOUS
  Filled 2019-03-16 (×2): qty 1

## 2019-03-16 MED ORDER — VITAMIN D3 25 MCG (1000 UNIT) PO TABS
1000.0000 [IU] | ORAL_TABLET | ORAL | Status: DC
  Administered 2019-03-16 – 2019-03-18 (×4): 1000 [IU] via ORAL
  Filled 2019-03-16 (×7): qty 1

## 2019-03-16 MED ORDER — SODIUM CHLORIDE 0.9 % IV SOLN
100.0000 mg | INTRAVENOUS | Status: DC
  Administered 2019-03-17 – 2019-03-18 (×2): 100 mg via INTRAVENOUS
  Filled 2019-03-16 (×2): qty 20

## 2019-03-16 MED ORDER — LEVOTHYROXINE SODIUM 50 MCG PO TABS
50.0000 ug | ORAL_TABLET | Freq: Every day | ORAL | Status: DC
  Administered 2019-03-16 – 2019-03-17 (×2): 50 ug via ORAL
  Filled 2019-03-16 (×2): qty 1

## 2019-03-16 MED ORDER — ROSUVASTATIN CALCIUM 20 MG PO TABS
20.0000 mg | ORAL_TABLET | Freq: Every day | ORAL | Status: DC
  Administered 2019-03-16 – 2019-03-17 (×2): 20 mg via ORAL
  Filled 2019-03-16 (×2): qty 1

## 2019-03-16 MED ORDER — ASPIRIN EC 81 MG PO TBEC
81.0000 mg | DELAYED_RELEASE_TABLET | Freq: Every day | ORAL | Status: DC
  Administered 2019-03-16 – 2019-03-18 (×3): 81 mg via ORAL
  Filled 2019-03-16 (×3): qty 1

## 2019-03-16 MED ORDER — NYSTATIN 100000 UNIT/GM EX POWD
Freq: Three times a day (TID) | CUTANEOUS | Status: DC
  Administered 2019-03-16 – 2019-03-17 (×6): via TOPICAL
  Administered 2019-03-18: 1 g via TOPICAL
  Filled 2019-03-16: qty 15

## 2019-03-16 MED ORDER — FAMOTIDINE 20 MG PO TABS
40.0000 mg | ORAL_TABLET | Freq: Every day | ORAL | Status: DC
  Administered 2019-03-16 – 2019-03-17 (×2): 40 mg via ORAL
  Filled 2019-03-16 (×2): qty 2

## 2019-03-16 NOTE — ED Provider Notes (Signed)
[facility] Emergency Department Provider Note    ____________________________________________   I have reviewed the triage vital signs and the nursing notes.   HISTORY  Chief Complaint Fever   History limited by and level 5 caveat due to: Altered mental status   HPI Anita Ward is a 85 y.o. female who presents to the emergency department today because of concern for confusion, fevers and known exposure to covid. The patient herself cannot appropriately answer questions. Apparently had a hairdresser who tested positive for covid. EMS found patient to be febrile to 102.3.   Records reviewed. Per medical record review patient has a history of CHF, CAD, HTN.   Past Medical History:  Diagnosis Date  . Anxiety   . Congestive heart failure (CHF) (HCC)   . Coronary artery disease   . Glaucoma   . HTN (hypertension)   . Osteoporosis     Past Surgical History:  Procedure Laterality Date  . CARDIAC SURGERY    . CARDIAC SURGERY    . CORONARY ARTERY BYPASS GRAFT      Prior to Admission medications   Medication Sig Start Date End Date Taking? Authorizing Provider  ALPRAZolam (XANAX) 0.25 MG tablet Take 0.25 mg by mouth 4 (four) times daily. 12/18/18   [provider]  aspirin EC 81 MG tablet Take 81 mg by mouth daily.    [provider]  Calcium Carbonate-Vitamin D (CALCIUM 600+D) 600-400 MG-UNIT tablet Take 1 tablet by mouth 2 (two) times daily.    [provider]  cholecalciferol (VITAMIN D) 1000 UNITS tablet Take 1,000 Units by mouth every morning.     [provider]  cloNIDine (CATAPRES) 0.3 MG tablet Take 1 tablet (0.3 mg total) by mouth 2 (two) times daily. 02/22/16   Houston SirenSainani, Vivek J, MD  clopidogrel (PLAVIX) 75 MG tablet Take 1 tablet (75 mg total) by mouth daily with breakfast. 05/29/15   Altamese DillingVachhani, Vaibhavkumar, MD  clotrimazole-betamethasone (LOTRISONE) cream Apply 1 application topically 2 (two) times daily as  needed.     [provider]  CRANBERRY PO Take 1 capsule by mouth every morning.     [provider]  famotidine (PEPCID) 20 MG tablet Take 40 mg by mouth daily.  02/03/19   [provider]  ferrous sulfate 325 (65 FE) MG tablet Take 325 mg by mouth daily with breakfast. 10/02/18   [provider]  furosemide (LASIX) 40 MG tablet Take 1 tablet (40 mg total) by mouth at bedtime. 02/20/19 03/22/19  Shaune Pollackhen, Qing, MD  hydrALAZINE (APRESOLINE) 50 MG tablet Take 1 tablet (50 mg total) by mouth every 6 (six) hours. Patient taking differently: Take 50 mg by mouth 3 (three) times daily.  03/22/16   Enid BaasKalisetti, Radhika, MD  isosorbide mononitrate (IMDUR) 30 MG 24 hr tablet Take 30 mg by mouth daily. 07/22/18 07/22/19  [provider]  levothyroxine (SYNTHROID) 50 MCG tablet Take 50 mcg by mouth daily before breakfast. Take on an empty stomach with a glass of water at least 30 to 60 minutes before breakfast 08/07/18   [provider]  losartan (COZAAR) 100 MG tablet Take 1 tablet (100 mg total) by mouth at bedtime. 01/15/19   Shirley, [place], DO  Magnesium 200 MG TABS Take 200 mg by mouth 2 (two) times daily.    [provider]  metoprolol tartrate (LOPRESSOR) 25 MG tablet Take 25 mg by mouth 2 (two) times a day. 08/07/18   [provider]  nitroGLYCERIN (NITROSTAT)  0.4 MG SL tablet Place 0.4 mg under the tongue every 5 (five) minutes x 3 doses as needed for chest pain.     [provider]  potassium chloride SA (K-DUR) 20 MEQ tablet Take 20 mEq by mouth daily. 03/05/19   [provider]  rosuvastatin (CRESTOR) 20 MG tablet Take 1 tablet (20 mg total) by mouth daily at 6 PM. 01/15/19   Talbert Forest, [place], DO  traMADol (ULTRAM) 50 MG tablet Take 50 mg by mouth 3 (three) times daily as needed for moderate pain.    [provider]  vitamin B-12 (CYANOCOBALAMIN) 1000 MCG tablet Take 1,000 mcg by mouth every morning.     [provider]    Allergies Benicar [olmesartan], Cardura [doxazosin mesylate], Ciprofloxacin, Fosamax [alendronate sodium], Lisinopril, Norvasc [amlodipine besylate], Penicillins, Sulfa antibiotics, and Torsemide  Family History  Problem Relation Age of Onset  . Congestive Heart Failure Father   . CAD Brother     Social History Social History   Tobacco Use  . Smoking status: Never Smoker  . Smokeless tobacco: Never Used  Substance Use Topics  . Alcohol use: No  . Drug use: No    Review of Systems Unable to obtain reliable ROS secondary to AMS ____________________________________________   PHYSICAL EXAM:  VITAL SIGNS: ED Triage Vitals  Enc Vitals Group     BP 03/15/19 1940 (!) 163/72     Pulse Rate 03/15/19 1940 72     Resp 03/15/19 1940 (!) 36     Temp 03/15/19 1940 (!) 102.6 F (39.2 C)     Temp Source 03/15/19 1940 Oral     SpO2 03/15/19 1940 96 %     Weight 03/15/19 1935 130 lb (59 kg)     Height 03/15/19 1935 5' (1.524 m)   Constitutional: Awake and alert. Not oriented.  Eyes: Conjunctivae are normal.  ENT      Head: Normocephalic and atraumatic.      Nose: No congestion/rhinnorhea.      Mouth/Throat: Mucous membranes are moist.      Neck: No stridor. Hematological/Lymphatic/Immunilogical: No cervical lymphadenopathy. Cardiovascular: Normal rate, regular rhythm.  No murmurs, rubs, or gallops.  Respiratory: Increased respiratory rate. Diffuse rhonchi.  Gastrointestinal: Soft and non tender. No rebound. No guarding.  Genitourinary: Deferred Musculoskeletal: Normal range of motion in all extremities. No lower extremity edema. Neurologic:  Awake and alert. Not oriented to events, place or time. Moving all extremities.  Skin:  Skin is warm, dry and intact. No rash noted. ____________________________________________    LABS (pertinent positives/negatives)  COVID positive UA turbid, protein 100, large leukocytes, 21-50 WBC, many bacteria CMP na 132, k  4.6, glu 122, cr 1.22, ca 8.6 CBC wbc 9.9, hgb 12.4, plt 167  ____________________________________________   EKG  I, Phineas Semen, attending physician, personally viewed and interpreted this EKG  EKG Time: 1935 Rate: 73 Rhythm: sinus rhythm Axis: right axis deviation Intervals: qtc 485 QRS: q waves v1 ST changes: no st elevation Impression: abnormal ekg  ____________________________________________    RADIOLOGY  CXR Diffuse bilateral airspace disease  ____________________________________________   PROCEDURES  Procedures  ____________________________________________   INITIAL IMPRESSION / ASSESSMENT AND PLAN / ED COURSE  Pertinent labs & imaging results that were available during my care of the patient were reviewed by me and considered in my medical decision making (see chart for details).   Patient presented to the emergency department today because of concern for AMS, fevers possible UTI. On exam patient is confused.  Was febrile initially. Broad infectious work up was initiated. UA is concerning for UTI. COVID test was positive. CXR consistent with COVID. Patient will be transferred to [facility].  ____________________________________________   FINAL CLINICAL IMPRESSION(S) / ED DIAGNOSES  Final diagnoses:  [id number]  Lower urinary tract infectious disease  Confusion  Hypoxia     Note: This dictation was prepared with Dragon dictation. Any transcriptional errors that result from this process are unintentional     Nance Pear, MD 03/27/2019 1530

## 2019-03-16 NOTE — ED Notes (Signed)
Bed assigned 159 ([address]) [place] per [facility]

## 2019-03-16 NOTE — Progress Notes (Signed)
PROGRESS NOTE                                                                                                                                                                                                             Patient Demographics:    Anita Ward, is a 85 y.o. female, DOB - [birth year], JEH:[id number]  Admit date - 04/03/2019   Admitting Physician Etta Quill, DO  Outpatient Primary MD for the patient is Tracie Harrier, MD  LOS - 0   No chief complaint on file.      Brief Narrative    This is a no charge note as patient admitted earlier today by Dr Alcario Drought.  85 y.o. female with medical history significant of HTN, CHF, CAD, mild dementia, presents with SOB and confusion   Patients' Hair dresser then happened to call family and inform them of +COVID test.  Patient brought in to [facility] ED. +Fever, urine odor.  No CP, no ABD pain.  He was hypoxic 87% on room air, found to have UTI, and fever, transferred to [facility] for further management.   Subjective:    Anita Ward is extremely hard of hearing, unable to provide any complaints   Assessment  & Plan :    Principal Problem:   Pneumonia due to COVID-19 virus Active Problems:   Acute lower UTI   CKD (chronic kidney disease) stage 3, GFR 30-59 ml/min (HCC)   HTN (hypertension)   Chronic diastolic CHF (congestive heart failure) (HCC)   Acute respiratory failure with hypoxia (HCC)   Acute hypoxic respiratory failure due to COVID-19 pneumonia -Hypoxic on presentation, 87% on room air, she remains on 2 L nasal cannula this morning, her chest x-ray significant for bilateral opacities. -Continue with IV steroids -On IV Remdesivir 04/08/2019 -Continue to follow inflammatory markers closely, CRP significantly elevated at 14.5 -So far no indication for Actemra given she is on 2 L nasal cannula -She will be encouraged use incentive spirometry and flutter valve  UTI -Continue  with IV Rocephin, follow on urine cultures.  Hypertension -Continue with home medications, BP uncontrolled, increased hydralazine to 100 mg TID, and added PRN hydralazine.  CKD stage III -At baseline, continue to monitor  Chronic diastolic CHF -Continue with Lasix, appears to be euvolemic  CAD -Continue with aspirin, statin, Plavix and Imdur  COVID-19 Labs  Recent Labs    Sep 03, 2018 0550  DDIMER 2.26*  FERRITIN 717*  CRP 14.5*    Lab Results  Component Value Date   SARSCOV2NAA POSITIVE (A) 03/15/2019   SARSCOV2NAA NEGATIVE 02/19/2019   SARSCOV2NAA NEGATIVE 01/13/2019     Code Status : Full  Family Communication  : Updated son waynes, and he requested to update his son Alinda Moneyony [phone] as Everlean PattersonWaynes [facility]  Disposition Plan  : Home when stable  Barriers For Discharge : remains on IV steroids, Remdesivir,   Consults  :  None  Procedures  : None  DVT Prophylaxis  :   [place] heparin  Lab Results  Component Value Date   PLT 138 (L) Feb 25, 202020    Antibiotics  :    Anti-infectives (From admission, onward)   Start     Dose/Rate Route Frequency Ordered Stop   03/17/19 0800  remdesivir 100 mg in sodium chloride 0.9 % 250 mL IVPB     100 mg 500 mL/hr over 30 Minutes Intravenous Every 24 hours Sep 03, 2018 0302 03/25/2019 0759   Sep 03, 2018 2200  cefTRIAXone (ROCEPHIN) 1 g in sodium chloride 0.9 % 100 mL IVPB     1 g 200 mL/hr over 30 Minutes Intravenous Every 24 hours Sep 03, 2018 0237     Sep 03, 2018 0400  remdesivir 200 mg in sodium chloride 0.9 % 250 mL IVPB     200 mg 500 mL/hr over 30 Minutes Intravenous Once Sep 03, 2018 0302 Sep 03, 2018 0413        Objective:   Vitals:   Sep 03, 2018 1000 Sep 03, 2018 1100 Sep 03, 2018 1200 Sep 03, 2018 1400  BP: (!) 185/62 (!) 169/67 (!) 177/56 (!) 173/63  Pulse: (!) 48 (!) 59 61 65  Resp: 14 17 (!) 21 (!) 26  Temp:      TempSrc:      SpO2: 97% 94% 93% 92%  Weight:      Height:        Wt Readings  from Last 3 Encounters:  Sep 03, 2018 58.9 kg  03/15/19 59 kg  02/20/19 58.2 kg     Intake/Output Summary (Last 24 hours) at 04/04/2019 1443 Last data filed at 04/04/2019 0656 Gross per 24 hour  Intake 120 ml  Output 300 ml  Net -180 ml     Physical Exam  Awake Alert, frail, pleasant, extremely hard of hearing Symmetrical Chest wall movement, diminished air entry at the bases, no wheezing RRR,No Gallops,Rubs or new Murmurs, No Parasternal Heave +ve B.Sounds, Abd Soft, No tenderness,  No rebound - guarding or rigidity. No Cyanosis, Clubbing or edema, No new Rash or bruise     Data Review:    CBC Recent Labs  Lab 03/15/19 1944 Sep 03, 2018 0550  WBC 9.9 5.5  HGB 12.4 12.7  HCT 37.5 40.0  PLT 167 138*  MCV 79.6* 83.0  MCH 26.3 26.3  MCHC 33.1 31.8  RDW 14.6 14.7  LYMPHSABS 0.9 0.6*  MONOABS 0.5 0.2  EOSABS 0.0 0.0  BASOSABS 0.0 0.0    Chemistries  Recent Labs  Lab 03/15/19 1944 Sep 03, 2018 0550  NA 132* 138  K 4.6 3.9  CL 98 101  CO2 23 24  GLUCOSE 122* 147*  BUN 32* 29*  CREATININE 1.22* 1.14*  CALCIUM 8.6* 8.3*  MG  --  2.3  AST 47* 44*  ALT 24 24  ALKPHOS 35* 36*  BILITOT 0.7 0.4   ------------------------------------------------------------------------------------------------------------------ No results for input(s): CHOL, HDL, LDLCALC, TRIG, CHOLHDL, LDLDIRECT in the  last 72 hours.  Lab Results  Component Value Date   HGBA1C 5.2 05/27/2015   ------------------------------------------------------------------------------------------------------------------ No results for input(s): TSH, T4TOTAL, T3FREE, THYROIDAB in the last 72 hours.  Invalid input(s): FREET3 ------------------------------------------------------------------------------------------------------------------ Recent Labs    03/23/2019 0550  FERRITIN 717*    Coagulation profile Recent Labs  Lab 03/15/19 1944  INR 1.1    Recent Labs    03/18/2019 0550  DDIMER 2.26*    Cardiac  Enzymes No results for input(s): CKMB, TROPONINI, MYOGLOBIN in the last 168 hours.  Invalid input(s): CK ------------------------------------------------------------------------------------------------------------------    Component Value Date/Time   BNP 780.0 (H) 02/19/2019 1331    Inpatient Medications  Scheduled Meds: . albuterol  2 puff Inhalation Q6H  . ALPRAZolam  0.25 mg Oral QID  . aspirin EC  81 mg Oral Daily  . calcium-vitamin D  1 tablet Oral BID  . cholecalciferol  1,000 Units Oral BH-q7a  . cloNIDine  0.3 mg Oral BID  . clopidogrel  75 mg Oral Q breakfast  . famotidine  40 mg Oral Daily  . ferrous sulfate  325 mg Oral Q breakfast  . furosemide  40 mg Oral QHS  . heparin injection (subcutaneous)  5,000 Units Subcutaneous Q8H  . hydrALAZINE  50 mg Oral TID  . isosorbide mononitrate  30 mg Oral Daily  . levothyroxine  50 mcg Oral QAC breakfast  . losartan  100 mg Oral QHS  . magnesium oxide  400 mg Oral BID  . mouth rinse  15 mL Mouth Rinse BID  . methylPREDNISolone (SOLU-MEDROL) injection  40 mg Intravenous Q8H  . metoprolol tartrate  25 mg Oral BID  . nystatin   Topical TID  . pantoprazole  40 mg Oral Daily  . potassium chloride SA  20 mEq Oral Daily  . rosuvastatin  20 mg Oral q1800  . vitamin B-12  1,000 mcg Oral BH-q7a   Continuous Infusions: . cefTRIAXone (ROCEPHIN)  IV    . [START ON 03/17/2019] remdesivir 100 mg in NS 250 mL     PRN Meds:.acetaminophen, hydrALAZINE, ondansetron **OR** ondansetron (ZOFRAN) IV, traMADol  Micro Results Recent Results (from the past 240 hour(s))  SARS Coronavirus 2 [facility] order, Performed in [facility] hospital lab) Nasopharyngeal Nasopharyngeal Swab     Status: Abnormal   Collection Time: 03/15/19  7:44 PM   Specimen: Nasopharyngeal Swab  Result Value Ref Range Status   SARS Coronavirus 2 POSITIVE (A) NEGATIVE Final    Comment: RESULT CALLED TO, READ BACK BY AND VERIFIED WITH: LYNN,R AT 2141 ON 03/15/2019 BY JPM  (NOTE) If result is NEGATIVE SARS-CoV-2 target nucleic acids are NOT DETECTED. The SARS-CoV-2 RNA is generally detectable in upper and lower  respiratory specimens during the acute phase of infection. The lowest  concentration of SARS-CoV-2 viral copies this assay can detect is 250  copies / mL. A negative result does not preclude SARS-CoV-2 infection  and should not be used as the sole basis for treatment or other  patient management decisions.  A negative result may occur with  improper specimen collection / handling, submission of specimen other  than nasopharyngeal swab, presence of viral mutation(s) within the  areas targeted by this assay, and inadequate number of viral copies  (<250 copies / mL). A negative result must be combined with clinical  observations, patient history, and epidemiological information. If result is POSITIVE SARS-CoV-2 target nucleic acids are DETECTED. The  SARS-CoV-2 RNA is generally detectable in upper and lower  respiratory specimens during  the acute phase of infection.  Positive  results are indicative of active infection with SARS-CoV-2.  Clinical  correlation with patient history and other diagnostic information is  necessary to determine patient infection status.  Positive results do  not rule out bacterial infection or co-infection with other viruses. If result is PRESUMPTIVE POSTIVE SARS-CoV-2 nucleic acids MAY BE PRESENT.   A presumptive positive result was obtained on the submitted specimen  and confirmed on repeat testing.  While 2019 novel coronavirus  (SARS-CoV-2) nucleic acids may be present in the submitted sample  additional confirmatory testing may be necessary for epidemiological  and / or clinical management purposes  to differentiate between  SARS-CoV-2 and other Sarbecovirus currently known to infect humans.  If clinically indicated additional testing with an alternate test  methodology [phone]([id number]) is a dvised. The SARS-CoV-2 RNA is  generally  detectable in upper and lower respiratory specimens during the acute  phase of infection. The expected result is Negative. Fact Sheet for Patients:  BoilerBrush.com.cyhttps://www.fda.gov/media/136312/download Fact Sheet for Healthcare Providers: https://pope.com/https://www.fda.gov/media/136313/download This test is not yet approved or cleared by the [place] FDA and has been authorized for detection and/or diagnosis of SARS-CoV-2 by FDA under an Emergency Use Authorization (EUA).  This EUA will remain in effect (meaning this test can be used) for the duration of the COVID-19 declaration under Section 564(b)(1) of the Act, 21 U.S.C. section 360bbb-3(b)(1), unless the authorization is terminated or revoked sooner. Performed at [facility], [address] [place], [place] [address]   Blood culture (routine x 2)     Status: None (Preliminary result)   Collection Time: 03/15/19  7:44 PM   Specimen: BLOOD  Result Value Ref Range Status   Specimen Description BLOOD LEFT FOREARM  Final   Special Requests   Final    BOTTLES DRAWN AEROBIC AND ANAEROBIC Blood Culture results may not be optimal due to an excessive volume of blood received in culture bottles   Culture   Final    NO GROWTH < 12 HOURS Performed at [facility], [address] [place], [place] [address]    Report Status PENDING  Incomplete  Blood culture (routine x 2)     Status: None (Preliminary result)   Collection Time: 03/15/19  7:44 PM   Specimen: BLOOD  Result Value Ref Range Status   Specimen Description BLOOD RIGHT HAND  Final   Special Requests   Final    BOTTLES DRAWN AEROBIC AND ANAEROBIC Blood Culture adequate volume   Culture   Final    NO GROWTH < 12 HOURS Performed at [facility], [address] [place], [place] [address]    Report Status PENDING  Incomplete    Radiology Reports Dg Chest 2 View  Result Date: 02/19/2019 CLINICAL DATA:  Shortness of breath EXAM: CHEST - 2 VIEW COMPARISON:   December 25, 2017 FINDINGS: The heart size remains enlarged. Aortic calcifications are noted. The patient is status post prior median sternotomy. There are chronic appearing airspace opacities at the lung bases bilaterally. There is no pneumothorax. There appear to be new small bilateral pleural effusions. There is no acute osseous abnormality. There is diffuse osteopenia involving the thoracic spine. There is height loss of several lower thoracic vertebral bodies. IMPRESSION: 1. Cardiomegaly with small bilateral pleural effusions. There is likely some mild vascular congestion. 2. Chronic lung changes at the lung bases bilaterally favored to represent scarring or atelectasis. Electronically Signed   By: Beryle Quanthristopher  Green M.D.  On: 02/19/2019 14:30   Dg Chest Portable 1 View  Result Date: 03/15/2019 CLINICAL DATA:  Fever. Altered mental status. Exposure to COVID-19. EXAM: PORTABLE CHEST 1 VIEW COMPARISON:  02/19/2019 FINDINGS: Interval patchy airspace opacity throughout the right lung and lower half of the left lung. Possible small left pleural effusion. Stable enlarged cardiac silhouette and post CABG changes. Diffuse osteopenia. IMPRESSION: 1. Bilateral pneumonia, greater on the right. 2. Possible small left pleural effusion. 3. Stable cardiomegaly. Electronically Signed   By: Beckie SaltsSteven  Reid M.D.   On: 03/15/2019 20:09    Time Spent in minutes  No Charge   Huey Bienenstockawood Gerldine Suleiman M.D on 03/27/2019 at 2:43 PM  Between 7am to 7pm - Pager - [phone]  After 7pm go to [email or web address] - password [email or web address]  [facility] -  Office  [phone]

## 2019-03-16 NOTE — H&P (Signed)
History and Physical    Anita SalinesDorothy H Orvis AVW:[id number] DOB: 07/04/1920 DOA: 03/14/2019  PCP: Barbette ReichmannHande, Vishwanath, MD  Patient coming from: Home / [facility]  I have personally briefly reviewed patient's old medical records in [facility]  Chief Complaint: SOB, AMS  HPI: Anita Ward is a 85 y.o. female with medical history significant of HTN, CHF, CAD, mild dementia.  Patient with SOB and confusion onset earlier yesterday.  Patients' Hair dresser then happened to call family and inform them of +COVID test.  [facility].  +Fever, urine odor.  No CP, no ABD pain.   ED Course: patient with UTI and +COVID.  CXR shows B PNA > on right.  Patient started on decadron and rocephin in ED.  Satting 87% on RA, improved to mid 90s on 2L via [place].  Hospitalist asked to admit.   Review of Systems: As per HPI, otherwise all review of systems negative.  Past Medical History:  Diagnosis Date  . Anxiety   . Congestive heart failure (CHF) (HCC)   . Coronary artery disease   . Glaucoma   . HTN (hypertension)   . Osteoporosis     Past Surgical History:  Procedure Laterality Date  . CARDIAC SURGERY    . CARDIAC SURGERY    . CORONARY ARTERY BYPASS GRAFT       reports that she has never smoked. She has never used smokeless tobacco. She reports that she does not drink alcohol or use drugs.  Allergies  Allergen Reactions  . Benicar [Olmesartan] Other (See Comments)    Reaction:  Unknown   . Cardura [Doxazosin Mesylate] Other (See Comments)    Reaction:  Unknown   . Ciprofloxacin Other (See Comments)    Reaction:  Unknown  . Fosamax [Alendronate Sodium] Other (See Comments)    Reaction:  Unknown  . Lisinopril Other (See Comments)    Reaction:  Unknown   . Norvasc [Amlodipine Besylate] Other (See Comments)    Reaction:  Unknown   . Penicillins Other (See Comments)    Reaction:  Unknown   . Sulfa Antibiotics Other (See Comments)    Reaction:  Unknown   . Torsemide  Anxiety    Family History  Problem Relation Age of Onset  . Congestive Heart Failure Father   . CAD Brother      Prior to Admission medications   Medication Sig Start Date End Date Taking? Authorizing Provider  ALPRAZolam (XANAX) 0.25 MG tablet Take 0.25 mg by mouth 4 (four) times daily. 12/18/18   [provider]  aspirin EC 81 MG tablet Take 81 mg by mouth daily.    [provider]  Calcium Carbonate-Vitamin D (CALCIUM 600+D) 600-400 MG-UNIT tablet Take 1 tablet by mouth 2 (two) times daily.    [provider]  cholecalciferol (VITAMIN D) 1000 UNITS tablet Take 1,000 Units by mouth every morning.     [provider]  cloNIDine (CATAPRES) 0.3 MG tablet Take 1 tablet (0.3 mg total) by mouth 2 (two) times daily. 02/22/16   [place] SirenSainani, Vivek J, MD  clopidogrel (PLAVIX) 75 MG tablet Take 1 tablet (75 mg total) by mouth daily with breakfast. 05/29/15   Altamese DillingVachhani, Vaibhavkumar, MD  clotrimazole-betamethasone (LOTRISONE) cream Apply 1 application topically 2 (two) times daily as needed.     [provider]  CRANBERRY PO Take 1 capsule by mouth every morning.     [provider]  famotidine (PEPCID) 20 MG tablet Take 40 mg  by mouth daily.  02/03/19   [provider]  ferrous sulfate 325 (65 FE) MG tablet Take 325 mg by mouth daily with breakfast. 10/02/18   [provider]  furosemide (LASIX) 40 MG tablet Take 1 tablet (40 mg total) by mouth at bedtime. 02/20/19 03/22/19  Demetrios Loll, MD  hydrALAZINE (APRESOLINE) 50 MG tablet Take 1 tablet (50 mg total) by mouth every 6 (six) hours. Patient taking differently: Take 50 mg by mouth 3 (three) times daily.  03/22/16   Gladstone Lighter, MD  isosorbide mononitrate (IMDUR) 30 MG 24 hr tablet Take 30 mg by mouth daily. 07/22/18 07/22/19  [provider]  levothyroxine (SYNTHROID) 50 MCG tablet Take 50 mcg by mouth daily before breakfast. Take on an empty stomach with a glass of water at  least 30 to 60 minutes before breakfast 08/07/18   [provider]  losartan (COZAAR) 100 MG tablet Take 1 tablet (100 mg total) by mouth at bedtime. 01/15/19   Shirley, [place], DO  Magnesium 200 MG TABS Take 200 mg by mouth 2 (two) times daily.    [provider]  metoprolol tartrate (LOPRESSOR) 25 MG tablet Take 25 mg by mouth 2 (two) times a day. 08/07/18   [provider]  nitroGLYCERIN (NITROSTAT) 0.4 MG SL tablet Place 0.4 mg under the tongue every 5 (five) minutes x 3 doses as needed for chest pain.     [provider]  potassium chloride SA (K-DUR) 20 MEQ tablet Take 20 mEq by mouth daily. 03/05/19   [provider]  rosuvastatin (CRESTOR) 20 MG tablet Take 1 tablet (20 mg total) by mouth daily at 6 PM. 01/15/19   Enid Derry, [place], DO  traMADol (ULTRAM) 50 MG tablet Take 50 mg by mouth 3 (three) times daily as needed for moderate pain.    [provider]  vitamin B-12 (CYANOCOBALAMIN) 1000 MCG tablet Take 1,000 mcg by mouth every morning.     [provider]    Physical Exam: Vitals:   03/11/2019 0225  BP: (!) 175/69  Pulse: (!) 58  Resp: 16  Temp: 98.1 F (36.7 C)  SpO2: 97%    Constitutional: NAD, calm, comfortable Eyes: PERRL, lids and conjunctivae normal ENMT: Mucous membranes are moist. Posterior pharynx clear of any exudate or lesions.Normal dentition. Hard of hearing. Neck: normal, supple, no masses, no thyromegaly Respiratory: B crackles, productive cough. Cardiovascular: Regular rate and rhythm, no murmurs / rubs / gallops. No extremity edema. 2+ pedal pulses. No carotid bruits.  Abdomen: no tenderness, no masses palpated. No hepatosplenomegaly. Bowel sounds positive.  Musculoskeletal: no clubbing / cyanosis. No joint deformity upper and lower extremities. Good ROM, no contractures. Normal muscle tone.  Skin: Skin break down / yeast infection under breasts.  No sacral decubitus Neurologic: CN 2-12 grossly intact.  Sensation intact, DTR normal. Strength 5/5 in all 4.  Psychiatric: Mild confusion   Labs on Admission: I have personally reviewed following labs and imaging studies  CBC: Recent Labs  Lab 03/15/19 1944  WBC 9.9  NEUTROABS 8.5*  HGB 12.4  HCT 37.5  MCV 79.6*  PLT 825   Basic Metabolic Panel: Recent Labs  Lab 03/15/19 1944  NA 132*  K 4.6  CL 98  CO2 23  GLUCOSE 122*  BUN 32*  CREATININE 1.22*  CALCIUM 8.6*   GFR: Estimated Creatinine Clearance: 21.2 mL/min (A) (by C-G formula based on SCr of 1.22 mg/dL (H)). Liver Function Tests: Recent Labs  Lab 03/15/19 1944  AST 47*  ALT 24  ALKPHOS 35*  BILITOT 0.7  PROT 7.3  ALBUMIN 3.3*   No results for input(s): LIPASE, AMYLASE in the last 168 hours. No results for input(s): AMMONIA in the last 168 hours. Coagulation Profile: Recent Labs  Lab 03/15/19 1944  INR 1.1   Cardiac Enzymes: No results for input(s): CKTOTAL, CKMB, CKMBINDEX, TROPONINI in the last 168 hours. BNP (last 3 results) No results for input(s): PROBNP in the last 8760 hours. HbA1C: No results for input(s): HGBA1C in the last 72 hours. CBG: No results for input(s): GLUCAP in the last 168 hours. Lipid Profile: No results for input(s): CHOL, HDL, LDLCALC, TRIG, CHOLHDL, LDLDIRECT in the last 72 hours. Thyroid Function Tests: No results for input(s): TSH, T4TOTAL, FREET4, T3FREE, THYROIDAB in the last 72 hours. Anemia Panel: No results for input(s): VITAMINB12, FOLATE, FERRITIN, TIBC, IRON, RETICCTPCT in the last 72 hours. Urine analysis:    Component Value Date/Time   COLORURINE AMBER (A) 03/15/2019 1944   APPEARANCEUR TURBID (A) 03/15/2019 1944   APPEARANCEUR Clear 04/06/2013 0550   LABSPEC 1.017 03/15/2019 1944   LABSPEC 1.001 04/06/2013 0550   PHURINE 7.0 03/15/2019 1944   GLUCOSEU NEGATIVE 03/15/2019 1944   GLUCOSEU Negative 04/06/2013 0550   HGBUR NEGATIVE 03/15/2019 1944   BILIRUBINUR NEGATIVE 03/15/2019 1944   BILIRUBINUR  Negative 04/06/2013 0550   KETONESUR NEGATIVE 03/15/2019 1944   PROTEINUR 100 (A) 03/15/2019 1944   NITRITE NEGATIVE 03/15/2019 1944   LEUKOCYTESUR LARGE (A) 03/15/2019 1944   LEUKOCYTESUR Negative 04/06/2013 0550    Radiological Exams on Admission: Dg Chest Portable 1 View  Result Date: 03/15/2019 CLINICAL DATA:  Fever. Altered mental status. Exposure to COVID-19. EXAM: PORTABLE CHEST 1 VIEW COMPARISON:  02/19/2019 FINDINGS: Interval patchy airspace opacity throughout the right lung and lower half of the left lung. Possible small left pleural effusion. Stable enlarged cardiac silhouette and post CABG changes. Diffuse osteopenia. IMPRESSION: 1. Bilateral pneumonia, greater on the right. 2. Possible small left pleural effusion. 3. Stable cardiomegaly. Electronically Signed   By: Beckie Salts M.D.   On: 03/15/2019 20:09    EKG: Independently reviewed.  Assessment/Plan Principal Problem:   Pneumonia due to COVID-19 virus Active Problems:   Acute lower UTI   CKD (chronic kidney disease) stage 3, GFR 30-59 ml/min (HCC)   HTN (hypertension)   Chronic diastolic CHF (congestive heart failure) (HCC)   Acute respiratory failure with hypoxia (HCC)    1. COVID PNA - with new O2 requirement 1. COVID pathway 2. Continue decadron 3. Start remdesivir 4. No actemra since she has concurrent UTI 5. Cont pulse ox 6. Daily labs 7. See below for discussion of code status. 2. UTI - 1. Rocephin 2. Cultures pending 3. Acute metabolic encephalopathy - 1. Mild confusion, also has chronic mild dementia at baseline. 2. Grandson manages her home meds 3. Though she still performs most ADLs at home. 4. HTN - 1. Continue home BP meds 5. CKD stage 3 -  1. Chronic and stable, monitor while on treatment 6. Chronic diastolic CHF - 1. Continue lasix 2. Continue BP meds 7. CAD - continue ASA, statin, plavix, imdur  DVT prophylaxis: Lovenox Code Status: Full - Full code for now per grandson, if she  worsens, call grandson back.  I did suggest to him that full code wasn't really advisable given her age / discussed poor prognosis if it comes to a cardiac arrest. Family Communication: Spoke with grandson on phone, grandson is health care agent as  outlined in the living will we have on file in Epic. Disposition Plan: Home after admit Consults called: None Admission status: Admit to inpatient  Severity of Illness: The appropriate patient status for this patient is INPATIENT. Inpatient status is judged to be reasonable and necessary in order to provide the required intensity of service to ensure the patient's safety. The patient's presenting symptoms, physical exam findings, and initial radiographic and laboratory data in the context of their chronic comorbidities is felt to place them at high risk for further clinical deterioration. Furthermore, it is not anticipated that the patient will be medically stable for discharge from the hospital within 2 midnights of admission. The following factors support the patient status of inpatient.   IP status for COVID-19 PNA with new O2 requirement.  * I certify that at the point of admission it is my clinical judgment that the patient will require inpatient hospital care spanning beyond 2 midnights from the point of admission due to high intensity of service, high risk for further deterioration and high frequency of surveillance required.*    ,  M. DO [facility]  How to contact the [facility] Attending or Consulting provider 7A - 7P or covering provider during after hours 7P -7A, for this patient?  1. Check the care team in [facility] and look for a) attending/consulting [facility] provider listed and b) the [facility] team listed 2. Log into [email or web address]  Amion Physician Scheduling and messaging for groups and whole hospitals  On call and physician scheduling software for group practices, residents, hospitalists and other medical providers for call, clinic,  rotation and shift schedules. OnCall Enterprise is a hospital-wide system for scheduling doctors and paging doctors on call. EasyPlot is for scientific plotting and data analysis.  [email or web address]  and use [place]'s universal password to access. If you do not have the password, please contact the hospital operator.  3. Locate the [facility] provider you are looking for under [facility] and page to a number that you can be directly reached. 4. If you still have difficulty reaching the provider, please page the [facility] (Director on Call) for the Hospitalists listed on amion for assistance.  04/08/2019, 2:40 AM

## 2019-03-16 NOTE — Progress Notes (Signed)
Pharmacy Brief Note   O:  ALT: 24  CXR: Interval patchy airspace opacity throughout the right lung and lower half of the left lung  SpO2: 97% on 2L.min on nasal cannula    A/P:  Patient meets requirements for remdesivir therapy.  Will start  remdesivir 200 mg IV x 1  followed by 100 mg IV daily x 4 days.  Monitor ALT  Royetta Asal, PharmD, BCPS 03/31/2019 3:00 AM

## 2019-03-16 NOTE — Plan of Care (Signed)
  Problem: Urinary Elimination: Goal: Signs and symptoms of infection will decrease Outcome: Progressing   Problem: Coping: Goal: Psychosocial and spiritual needs will be supported Outcome: Progressing   Problem: Respiratory: Goal: Will maintain a patent airway Outcome: Progressing Goal: Complications related to the disease process, condition or treatment will be avoided or minimized Outcome: Progressing

## 2019-03-16 NOTE — Progress Notes (Signed)
Rapid response called for patient with blood pressure in the 200s, new abdominal distension noted. Md Contacted for orders, blood pressure in 170s after dose of metoprolol IV.

## 2019-03-16 NOTE — Progress Notes (Addendum)
Up in chair.  Eating dinner.  Very anxious.  Tachypneic.  Hypertensive.   Scheduled xanax given as well as talking with patient and teaching breathing techniques.  Also encourage use of Incentive and flutter valve.  O2 sat 93-96% on 1l/[place].  Offered to call family for her to talk to but she wants to eat dinner first.    1852  Tachypneic and hypertensive.  Mews score 5.  Hydralazine given as ordered.  MD notified.

## 2019-03-16 NOTE — Plan of Care (Signed)
  Problem: Urinary Elimination: Goal: Signs and symptoms of infection will decrease Outcome: Progressing   Problem: Coping: Goal: Psychosocial and spiritual needs will be supported Outcome: Progressing   Problem: Respiratory: Goal: Will maintain a patent airway Outcome: Progressing Goal: Complications related to the disease process, condition or treatment will be avoided or minimized Outcome: Progressing   Problem: Education: Goal: Knowledge of risk factors and measures for prevention of condition will improve Outcome: Not Progressing

## 2019-03-17 LAB — CBC WITH DIFFERENTIAL/PLATELET
Abs Immature Granulocytes: 0.11 10*3/uL — ABNORMAL HIGH (ref 0.00–0.07)
Basophils Absolute: 0 10*3/uL (ref 0.0–0.1)
Basophils Relative: 0 %
Eosinophils Absolute: 0 10*3/uL (ref 0.0–0.5)
Eosinophils Relative: 0 %
HCT: 43.2 % (ref 36.0–46.0)
Hemoglobin: 14.1 g/dL (ref 12.0–15.0)
Immature Granulocytes: 1 %
Lymphocytes Relative: 6 %
Lymphs Abs: 0.8 10*3/uL (ref 0.7–4.0)
MCH: 26.1 pg (ref 26.0–34.0)
MCHC: 32.6 g/dL (ref 30.0–36.0)
MCV: 79.9 fL — ABNORMAL LOW (ref 80.0–100.0)
Monocytes Absolute: 0.8 10*3/uL (ref 0.1–1.0)
Monocytes Relative: 6 %
Neutro Abs: 11.7 10*3/uL — ABNORMAL HIGH (ref 1.7–7.7)
Neutrophils Relative %: 87 %
Platelets: 222 10*3/uL (ref 150–400)
RBC: 5.41 MIL/uL — ABNORMAL HIGH (ref 3.87–5.11)
RDW: 14.9 % (ref 11.5–15.5)
WBC: 13.4 10*3/uL — ABNORMAL HIGH (ref 4.0–10.5)
nRBC: 0 % (ref 0.0–0.2)

## 2019-03-17 LAB — COMPREHENSIVE METABOLIC PANEL
ALT: 26 U/L (ref 0–44)
AST: 48 U/L — ABNORMAL HIGH (ref 15–41)
Albumin: 3 g/dL — ABNORMAL LOW (ref 3.5–5.0)
Alkaline Phosphatase: 37 U/L — ABNORMAL LOW (ref 38–126)
Anion gap: 13 (ref 5–15)
BUN: 35 mg/dL — ABNORMAL HIGH (ref 8–23)
CO2: 23 mmol/L (ref 22–32)
Calcium: 8.4 mg/dL — ABNORMAL LOW (ref 8.9–10.3)
Chloride: 101 mmol/L (ref 98–111)
Creatinine, Ser: 1.1 mg/dL — ABNORMAL HIGH (ref 0.44–1.00)
GFR calc Af Amer: 49 mL/min — ABNORMAL LOW (ref >60)
GFR calc non Af Amer: 42 mL/min — ABNORMAL LOW (ref >60)
Glucose, Bld: 122 mg/dL — ABNORMAL HIGH (ref 70–99)
Potassium: 3.1 mmol/L — ABNORMAL LOW (ref 3.5–5.1)
Sodium: 137 mmol/L (ref 135–145)
Total Bilirubin: 0.6 mg/dL (ref 0.3–1.2)
Total Protein: 7.5 g/dL (ref 6.5–8.1)

## 2019-03-17 LAB — D-DIMER, QUANTITATIVE: D-Dimer, Quant: 2.75 ug/mL-FEU — ABNORMAL HIGH (ref 0.00–0.50)

## 2019-03-17 LAB — FERRITIN: Ferritin: 1367 ng/mL — ABNORMAL HIGH (ref 11–307)

## 2019-03-17 LAB — PROCALCITONIN: Procalcitonin: 0.72 ng/mL

## 2019-03-17 LAB — C-REACTIVE PROTEIN: CRP: 13.1 mg/dL — ABNORMAL HIGH (ref <1.0)

## 2019-03-17 MED ORDER — FAMOTIDINE 20 MG PO TABS
20.0000 mg | ORAL_TABLET | Freq: Every day | ORAL | Status: DC
  Administered 2019-03-18: 09:00:00 20 mg via ORAL
  Filled 2019-03-17: qty 1

## 2019-03-17 NOTE — Progress Notes (Signed)
Pt noted to have RED mews score of 4. Rechecked vital signs and repositioned pt. Pt now has a  GREEN MEWS score. NAD>

## 2019-03-17 NOTE — Evaluation (Signed)
Clinical/Bedside Swallow Evaluation Patient Details  Name: Anita Ward MRN: [id number] Date of Birth: October 22, 1919  Today's Date: 03/17/2019 Time: SLP Start Time (ACUTE ONLY): 1040 SLP Stop Time (ACUTE ONLY): 1050 SLP Time Calculation (min) (ACUTE ONLY): 10 min  Past Medical History:  Past Medical History:  Diagnosis Date  . Anxiety   . Congestive heart failure (CHF) (HCC)   . Coronary artery disease   . Glaucoma   . HTN (hypertension)   . Osteoporosis    Past Surgical History:  Past Surgical History:  Procedure Laterality Date  . CARDIAC SURGERY    . CARDIAC SURGERY    . CORONARY ARTERY BYPASS GRAFT     HPI:  85 y.o.femalewith medical history significant ofHTN, CHF, CAD, mild dementia, presents with SOB and confusion  Patients' Hair dresser then happened to call family and inform them of +COVID test.  She was hypoxic 87% on room air, found to have UTI, and fever, transferred to [facility] for further management. Bilateral pna, greater on the right. Has been anxious and tachypneic. no prior history of dysphagia in the chart.    Assessment / Plan / Recommendation Clinical Impression  Pt demonstrates confusion but adequate arousal and awareness of PO. States she normally feeds herself, but is a little too weak to hold her cup well today. Pt had one instance of immediate hard coughing with an ill timed cup sip; she seemed to struggle more with tipping cup and achieving oral control with flow of bolus with a cup. With a straw she could initiate sequential sips without difficulty and no coughing. Slightly prolonged mastication with meats, but otherwise pt able to masticate well. Recommend basic precautions, small straw sips of thin liquids and mechanical soft solids. Will f/u briefly if tolerating diet.  SLP Visit Diagnosis: Dysphagia, oral phase (R13.11)    Aspiration Risk  Mild aspiration risk    Diet Recommendation Dysphagia 3 (Mech soft);Thin liquid   Liquid  Administration via: Straw Medication Administration: Whole meds with puree Supervision: Staff to assist with self feeding Compensations: Slow rate;Small sips/bites Postural Changes: Seated upright at 90 degrees    Other  Recommendations Oral Care Recommendations: Oral care BID   Follow up Recommendations Skilled Nursing facility      Frequency and Duration min 2x/week  1 week       Prognosis Prognosis for Safe Diet Advancement: Good      Swallow Study   General HPI: 85 y.o.femalewith medical history significant ofHTN, CHF, CAD, mild dementia, presents with SOB and confusion  Patients' Hair dresser then happened to call family and inform them of +COVID test.  She was hypoxic 87% on room air, found to have UTI, and fever, transferred to [facility] for [facility] management. Bilateral pna, greater on the right. Has been anxious and tachypneic. no prior history of dysphagia in the chart.  Type of Study: Bedside Swallow Evaluation Diet Prior to this Study: Regular;Thin liquids Temperature Spikes Noted: No Respiratory Status: Nasal cannula Behavior/Cognition: Alert;Cooperative;Pleasant mood;Confused Self-Feeding Abilities: Needs assist Patient Positioning: Upright in bed Baseline Vocal Quality: Normal Volitional Cough: Strong Volitional Swallow: Able to elicit    Oral/Motor/Sensory Function Overall Oral Motor/Sensory Function: Within functional limits   Ice Chips Ice chips: Not tested   Thin Liquid Thin Liquid: Impaired Presentation: Cup;Straw Pharyngeal  Phase Impairments: Cough - Immediate    Nectar Thick Nectar Thick Liquid: Not tested   Honey Thick Honey Thick Liquid: Not tested   Puree Puree: Within functional limits  Presentation: Spoon   Solid     Solid: Impaired Oral Phase Impairments: Impaired mastication     Herbie Baltimore, MA CCC-SLP  Acute Rehabilitation Services Pager [phone] Office [phone]  Lynann Beaver 03/17/2019,11:11  AM

## 2019-03-17 NOTE — Progress Notes (Signed)
Pt's son able to use facetime to speak with pt.

## 2019-03-17 NOTE — Progress Notes (Signed)
Spoke with Benay Pillow at [employer] to set up [employer] between patient and patient son.

## 2019-03-17 NOTE — Evaluation (Signed)
Physical Therapy Evaluation Patient Details Name: Anita Ward MRN: [id number] DOB: [birth year] Today's Date: 03/17/2019   History of Present Illness  85 y.o. female admitted on 03/17/19 for SOB and confusion.  Pt dx with COVID 19 PNA, acute metabolic encepholopathy, UTI.  Other significant PMH of HTN, glaucoma, CAD, CHF, CABG.    Clinical Impression  Pt was able to get up OOB to the [facility] and the recliner chair, however, her HR spiked to 157 during just OOB mobility (no gait).  O2 remained in the 90s on 4 L O2 [place].  Pt is very HOH and this make my assessment of her cognition difficult.  I was unable to get her son on the phone to confirm history.  PT will continue to follow acutely for safe mobility progression    Follow Up Recommendations Home health PT;Supervision/Assistance - 24 hour    Equipment Recommendations  Other (comment)(home O2)    Recommendations for Other Services   Na    Precautions / Restrictions Precautions Precautions: Fall;Other (comment) Precaution Comments: monitor HR and O2      Mobility  Bed Mobility Overal bed mobility: Needs Assistance Bed Mobility: Supine to Sit     Supine to sit: Min assist;HOB elevated     General bed mobility comments: Min assist with HOB elevated to support trunk during transition.   Transfers Overall transfer level: Needs assistance Equipment used: Rolling walker (2 wheeled) Transfers: Sit to/from [employer] Sit to Stand: Min assist Stand pivot transfers: Min assist       General transfer comment: Min assist to transfer to [facility], then to chair with RW.    Ambulation/Gait             General Gait Details: NT due to high HR during transfer 157 max observed         Balance Overall balance assessment: Needs assistance Sitting-balance support: Feet supported;Bilateral upper extremity supported Sitting balance-Leahy Scale: Fair     Standing balance support: Bilateral upper extremity  supported Standing balance-Leahy Scale: Poor Standing balance comment: needs support                             Pertinent Vitals/Pain Pain Assessment: No/denies pain    Home Living Family/patient expects to be discharged to:: Private residence Living Arrangements: Children Available Help at Discharge: Family Type of Home: House Home Access: Stairs to enter   Secretary/administrator of Steps: 1 Home Layout: One level Home Equipment: Environmental consultant - 2 wheels;Walker - 4 wheels;Cane - quad Additional Comments: unclear if hx is accurate    Prior Function Level of Independence: Independent with assistive device(s);Needs assistance   Gait / Transfers Assistance Needed: walks with RW "sometimes"  ADL's / Homemaking Assistance Needed: sounds like son assist with housework, cooking, cleaning, but she does her own bathing dressing and toileting.         Hand Dominance   Dominant Hand: Right    Extremity/Trunk Assessment   Upper Extremity Assessment Upper Extremity Assessment: Defer to OT evaluation    Lower Extremity Assessment Lower Extremity Assessment: Generalized weakness    Cervical / Trunk Assessment Cervical / Trunk Assessment: Kyphotic  Communication   Communication: HOH(L ear seemingly better)  Cognition Arousal/Alertness: Awake/alert Behavior During Therapy: WFL for tasks assessed/performed Overall Cognitive Status: No family/caregiver present to determine baseline cognitive functioning  General Comments: not sure of baseline, complicated by extremely HOH      General Comments General comments (skin integrity, edema, etc.): HR max 157, O2 90% on 4 L during mobility        Assessment/Plan    PT Assessment Patient needs continued PT services  PT Problem List Decreased strength;Decreased activity tolerance;Decreased balance;Decreased mobility;Decreased cognition;Decreased knowledge of use of  DME;Cardiopulmonary status limiting activity;Decreased knowledge of precautions;Decreased safety awareness       PT Treatment Interventions DME instruction;Stair training;Gait training;Functional mobility training;Therapeutic activities;Therapeutic exercise;Balance training;Patient/family education;Cognitive remediation    PT Goals (Current goals can be found in the Care Plan section)  Acute Rehab PT Goals Patient Stated Goal: to go home today PT Goal Formulation: With patient Time For Goal Achievement: 03/31/19 Potential to Achieve Goals: Good    Frequency Min 3X/week        AM-PAC PT "6 Clicks" Mobility  Outcome Measure Help needed turning from your back to your side while in a flat bed without using bedrails?: A Little Help needed moving from lying on your back to sitting on the side of a flat bed without using bedrails?: A Little Help needed moving to and from a bed to a chair (including a wheelchair)?: A Little Help needed standing up from a chair using your arms (e.g., wheelchair or bedside chair)?: A Little Help needed to walk in hospital room?: A Little Help needed climbing 3-5 steps with a railing? : A Lot 6 Click Score: 17    End of Session Equipment Utilized During Treatment: Gait belt;Oxygen Activity Tolerance: Patient limited by fatigue Patient left: in chair;with call bell/phone within reach;with chair alarm set Nurse Communication: Mobility status PT Visit Diagnosis: Muscle weakness (generalized) (M62.81);Difficulty in walking, not elsewhere classified (R26.2)    Time: 1700-1737 PT Time Calculation (min) (ACUTE ONLY): 37 min   Charges:       Wells Guiles B. Cherly Erno, PT, DPT  Acute Rehabilitation [phone] pager #[phone] office  @ [facility]: [phone]   PT Evaluation $PT Eval Moderate Complexity: 1 Mod PT Treatments $Therapeutic Activity: 8-22 mins       03/17/2019, 6:01 PM

## 2019-03-17 NOTE — Progress Notes (Signed)
PROGRESS NOTE                                                                                                                                                                                                             Patient Demographics:    Anita DonDorothy Ward, is a 85 y.o. female, DOB - 10/31/1919, FAO:[id number]  Outpatient Primary MD for the patient is Barbette ReichmannHande, Vishwanath, MD   Admit date - 03/18/2019   LOS - 1  No chief complaint on file.      Brief Narrative: Patient is a 85 y.o. female with PMHx of dementia, HTN,CAD s/p CABG presented with acute hypoxemic respiratory failure in setting of COVID-19 pneumonia.   Subjective:    Anita DonDorothy Goethe today remains confused this morning-she appears very weak and debilitated.   Assessment  & Plan :   Acute Hypoxic Resp Failure due to Covid 19 Viral pneumonia: Oxygen requirement seems to have worsened overnight-she is now on 4 L of oxygen.  She is frail and deconditioned-is very tenuous and remains at risk for further deterioration.  Plan is to continue with steroids and Remdesivir.  Fever: afebrile  O2 requirements: On 4 l/m   COVID-19 Labs: Recent Labs    2018/08/08 0550 03/17/19 0315  DDIMER 2.26* 2.75*  FERRITIN 717* 1,367*  CRP 14.5* 13.1*    Lab Results  Component Value Date   SARSCOV2NAA POSITIVE (A) 03/15/2019   SARSCOV2NAA NEGATIVE 02/19/2019   SARSCOV2NAA NEGATIVE 01/13/2019     COVID-19 Medications: Steroids: 9/5>> Remdesivir: 9/6>> Actemra: Not given Convalescent Plasma:N/A Research Studies:N/A  Other medications: Diuretics:Euvolemic-oral Lasix has been continued. Antibiotics: On Rocephin (started 9/6) for presumed UTI  Prone/Incentive Spirometry: Limited by confusion/dementia.  DVT Prophylaxis  :  Lovenox -  Acute metabolic encephalopathy: Secondary to COVID-19/hypoxemia/UTI-this is in the background of dementia.  Continue supportive care.  UTI:  Continue Rocephin-await cultures.  She is afebrile.  Chronic diastolic heart failure: Appears euvolemic-on Lasix  CAD s/p CABG: Appears to have no anginal symptoms-continue aspirin, Plavix  HTN: Reasonably controlled-continue clonidine, metoprolol, losartan-follow and adjust.  Hypothyroidism: Continue with levothyroxine  GERD: Continue PPI  ABG:    Component Value Date/Time   HCO3 29.1 (H) 03/15/2019 1944   O2SAT 89.2 03/15/2019 1944    Vent Settings:    Condition - Extremely Guarded-very tenuous with risk for further deterioration  Family Communication : Spoke with patient's son-we discussed declining trajectory-he apparently had just taken any pain medication for his-And wanted me to talk to his son Nicole Kindred.  Patient's son was okay with Nicole Kindred making decisions.  I subsequently spoke with Nicole Kindred (grandson)-family understands the tenuous/delicate situation-with potential for further decline-and that the patient may not survive this hospitalization.  Family is agreeable with DNR status.  We will watch patient closely-if she worsens-she is appropriate for comfort care if the family agrees.  Code Status :  DNR  Diet :  Diet Order            Diet Heart Room service appropriate? Yes; Fluid consistency: Thin  Diet effective now               Disposition Plan  :  Remain hospitalized  Consults  :  None  Procedures  :  None  GI prophylaxis: PPI  Antibiotics  :    Anti-infectives (From admission, onward)   Start     Dose/Rate Route Frequency Ordered Stop   03/17/19 0800  remdesivir 100 mg in sodium chloride 0.9 % 250 mL IVPB     100 mg 500 mL/hr over 30 Minutes Intravenous Every 24 hours 03/30/2019 0302 07-Apr-2019 0759   04/05/2019 2200  cefTRIAXone (ROCEPHIN) 1 g in sodium chloride 0.9 % 100 mL IVPB     1 g 200 mL/hr over 30 Minutes Intravenous Every 24 hours 03/30/2019 0237     04/05/2019 0400  remdesivir 200 mg in sodium chloride 0.9 % 250 mL IVPB     200 mg 500 mL/hr over 30  Minutes Intravenous Once 03/17/2019 0302 03/17/2019 0413      Inpatient Medications  Scheduled Meds:  albuterol  2 puff Inhalation Q6H   ALPRAZolam  0.25 mg Oral QID   aspirin EC  81 mg Oral Daily   calcium-vitamin D  1 tablet Oral BID   cholecalciferol  1,000 Units Oral BH-q7a   cloNIDine  0.3 mg Oral BID   clopidogrel  75 mg Oral Q breakfast   [START ON 03/18/2019] famotidine  20 mg Oral Daily   ferrous sulfate  325 mg Oral Q breakfast   furosemide  40 mg Oral QHS   heparin injection (subcutaneous)  5,000 Units Subcutaneous Q8H   hydrALAZINE  100 mg Oral TID   isosorbide mononitrate  30 mg Oral Daily   levothyroxine  50 mcg Oral QAC breakfast   losartan  100 mg Oral QHS   magnesium oxide  400 mg Oral BID   mouth rinse  15 mL Mouth Rinse BID   methylPREDNISolone (SOLU-MEDROL) injection  40 mg Intravenous Q8H   metoprolol tartrate  25 mg Oral BID   nystatin   Topical TID   pantoprazole  40 mg Oral Daily   potassium chloride SA  20 mEq Oral Daily   rosuvastatin  20 mg Oral q1800   vitamin B-12  1,000 mcg Oral BH-q7a   Continuous Infusions:  cefTRIAXone (ROCEPHIN)  IV Stopped (03/17/19 0701)   remdesivir 100 mg in NS 250 mL 100 mg (03/17/19 0808)   PRN Meds:.acetaminophen, hydrALAZINE, labetalol, ondansetron **OR** ondansetron (ZOFRAN) IV, phenol, traMADol   Time Spent in minutes 35  See all Orders from today for further details   Oren Binet M.D on 03/17/2019 at 11:46 AM  To page go to [email or web address] - use universal password  [facility] -  Office  [phone]    Objective:   Vitals:   03/17/19 0500 03/17/19 1696  03/17/19 0810 03/17/19 1100  BP: (!) 159/98  (!) 162/74 (!) 145/69  Pulse: 94 95 94 78  Resp: (!) 26 (!) 23 (!) 24 20  Temp:   97.7 F (36.5 C)   TempSrc:   Oral   SpO2: 92% 92% 93% 95%  Weight:      Height:        Wt Readings from Last 3 Encounters:  03/25/2019 58.9 kg  03/15/19 59 kg  02/20/19 58.2 kg      Intake/Output Summary (Last 24 hours) at 03/17/2019 1146 Last data filed at 03/17/2019 0300 Gross per 24 hour  Intake 460 ml  Output 151 ml  Net 309 ml     Physical Exam Gen Exam:Alert -pleasantly confused-slightly tachypneic. HEENT:atraumatic, normocephalic Chest: Bilateral rales CVS:S1S2 regular Abdomen:soft non tender, non distended Extremities:no edema Neurology: Generalized weakness-but appears nonfocal Skin: no rash   Data Review:    CBC Recent Labs  Lab 03/15/19 1944 04/01/2019 0550 03/17/19 0315  WBC 9.9 5.5 13.4*  HGB 12.4 12.7 14.1  HCT 37.5 40.0 43.2  PLT 167 138* 222  MCV 79.6* 83.0 79.9*  MCH 26.3 26.3 26.1  MCHC 33.1 31.8 32.6  RDW 14.6 14.7 14.9  LYMPHSABS 0.9 0.6* 0.8  MONOABS 0.5 0.2 0.8  EOSABS 0.0 0.0 0.0  BASOSABS 0.0 0.0 0.0    Chemistries  Recent Labs  Lab 03/15/19 1944 04/04/2019 0550 03/17/19 0315  NA 132* 138 137  K 4.6 3.9 3.1*  CL 98 101 101  CO2 23 24 23   GLUCOSE 122* 147* 122*  BUN 32* 29* 35*  CREATININE 1.22* 1.14* 1.10*  CALCIUM 8.6* 8.3* 8.4*  MG  --  2.3  --   AST 47* 44* 48*  ALT 24 24 26   ALKPHOS 35* 36* 37*  BILITOT 0.7 0.4 0.6   ------------------------------------------------------------------------------------------------------------------ No results for input(s): CHOL, HDL, LDLCALC, TRIG, CHOLHDL, LDLDIRECT in the last 72 hours.  Lab Results  Component Value Date   HGBA1C 5.2 05/27/2015   ------------------------------------------------------------------------------------------------------------------ No results for input(s): TSH, T4TOTAL, T3FREE, THYROIDAB in the last 72 hours.  Invalid input(s): FREET3 ------------------------------------------------------------------------------------------------------------------ Recent Labs    04/07/2019 0550 03/17/19 0315  FERRITIN 717* 1,367*    Coagulation profile Recent Labs  Lab 03/15/19 1944  INR 1.1    Recent Labs    03/22/2019 0550  03/17/19 0315  DDIMER 2.26* 2.75*    Cardiac Enzymes No results for input(s): CKMB, TROPONINI, MYOGLOBIN in the last 168 hours.  Invalid input(s): CK ------------------------------------------------------------------------------------------------------------------    Component Value Date/Time   BNP 780.0 (H) 02/19/2019 1331    Micro Results Recent Results (from the past 240 hour(s))  SARS Coronavirus 2 [facility] order, Performed in [facility] hospital lab) Nasopharyngeal Nasopharyngeal Swab     Status: Abnormal   Collection Time: 03/15/19  7:44 PM   Specimen: Nasopharyngeal Swab  Result Value Ref Range Status   SARS Coronavirus 2 POSITIVE (A) NEGATIVE Final    Comment: RESULT CALLED TO, READ BACK BY AND VERIFIED WITH: LYNN,R AT 2141 ON 03/15/2019 BY JPM (NOTE) If result is NEGATIVE SARS-CoV-2 target nucleic acids are NOT DETECTED. The SARS-CoV-2 RNA is generally detectable in upper and lower  respiratory specimens during the acute phase of infection. The lowest  concentration of SARS-CoV-2 viral copies this assay can detect is 250  copies / mL. A negative result does not preclude SARS-CoV-2 infection  and should not be used as the sole basis for treatment or other  patient management decisions.  A  negative result may occur with  improper specimen collection / handling, submission of specimen other  than nasopharyngeal swab, presence of viral mutation(s) within the  areas targeted by this assay, and inadequate number of viral copies  (<250 copies / mL). A negative result must be combined with clinical  observations, patient history, and epidemiological information. If result is POSITIVE SARS-CoV-2 target nucleic acids are DETECTED. The  SARS-CoV-2 RNA is generally detectable in upper and lower  respiratory specimens during the acute phase of infection.  Positive  results are indicative of active infection with SARS-CoV-2.  Clinical  correlation with patient history and other  diagnostic information is  necessary to determine patient infection status.  Positive results do  not rule out bacterial infection or co-infection with other viruses. If result is PRESUMPTIVE POSTIVE SARS-CoV-2 nucleic acids MAY BE PRESENT.   A presumptive positive result was obtained on the submitted specimen  and confirmed on repeat testing.  While 2019 novel coronavirus  (SARS-CoV-2) nucleic acids may be present in the submitted sample  additional confirmatory testing may be necessary for epidemiological  and / or clinical management purposes  to differentiate between  SARS-CoV-2 and other Sarbecovirus currently known to infect humans.  If clinically indicated additional testing with an alternate test  methodology [phone] is a dvised. The SARS-CoV-2 RNA is generally  detectable in upper and lower respiratory specimens during the acute  phase of infection. The expected result is Negative. Fact Sheet for Patients:  BoilerBrush.com.cyhttps://www.fda.gov/media/136312/download Fact Sheet for Healthcare Providers: https://pope.com/https://www.fda.gov/media/136313/download This test is not yet approved or cleared by the [place] FDA and has been authorized for detection and/or diagnosis of SARS-CoV-2 by FDA under an Emergency Use Authorization (EUA).  This EUA will remain in effect (meaning this test can be used) for the duration of the COVID-19 declaration under Section 564(b)(1) of the Act, 21 U.S.C. section 360bbb-3(b)(1), unless the authorization is terminated or revoked sooner. Performed at [facility], [address] [place], [place] [address]   Blood culture (routine x 2)     Status: None (Preliminary result)   Collection Time: 03/15/19  7:44 PM   Specimen: BLOOD  Result Value Ref Range Status   Specimen Description BLOOD LEFT FOREARM  Final   Special Requests   Final    BOTTLES DRAWN AEROBIC AND ANAEROBIC Blood Culture results may not be optimal due to an excessive volume of blood received in  culture bottles   Culture   Final    NO GROWTH 2 DAYS Performed at [facility], [address] [place], [place] [address]    Report Status PENDING  Incomplete  Blood culture (routine x 2)     Status: None (Preliminary result)   Collection Time: 03/15/19  7:44 PM   Specimen: BLOOD  Result Value Ref Range Status   Specimen Description BLOOD RIGHT HAND  Final   Special Requests   Final    BOTTLES DRAWN AEROBIC AND ANAEROBIC Blood Culture adequate volume   Culture   Final    NO GROWTH 2 DAYS Performed at [facility], [address] [place], [place] [address]    Report Status PENDING  Incomplete  Urine Culture     Status: Abnormal (Preliminary result)   Collection Time: 03/15/19  7:44 PM   Specimen: Urine, Random  Result Value Ref Range Status   Specimen Description   Final    URINE, RANDOM Performed at [facility], [address] [place], [place] [address]  Special Requests   Final    NONE Performed at [facility], [address] [place], [place] [address]    Culture >=100,000 COLONIES/mL GRAM NEGATIVE RODS (A)  Final   Report Status PENDING  Incomplete    Radiology Reports Dg Chest 2 View  Result Date: 02/19/2019 CLINICAL DATA:  Shortness of breath EXAM: CHEST - 2 VIEW COMPARISON:  December 25, 2017 FINDINGS: The heart size remains enlarged. Aortic calcifications are noted. The patient is status post prior median sternotomy. There are chronic appearing airspace opacities at the lung bases bilaterally. There is no pneumothorax. There appear to be new small bilateral pleural effusions. There is no acute osseous abnormality. There is diffuse osteopenia involving the thoracic spine. There is height loss of several lower thoracic vertebral bodies. IMPRESSION: 1. Cardiomegaly with small bilateral pleural effusions. There is likely some mild vascular congestion. 2. Chronic lung changes at the lung bases bilaterally favored to represent scarring  or atelectasis. Electronically Signed   By: Katherine Mantlehristopher  Green M.D.   On: 02/19/2019 14:30   Dg Abd 1 View  Result Date: 03/19/2019 CLINICAL DATA:  Abdominal distention EXAM: ABDOMEN - 1 VIEW COMPARISON:  05/15/2015 FINDINGS: Gaseous distention of bowel in the lower abdomen and pelvis, likely sigmoid colon. Moderate gaseous distention of the cecum. No small bowel dilatation. Prior cholecystectomy. No organomegaly or free air. IMPRESSION: Mild gaseous distention seen within the right colon and sigmoid colon. No convincing evidence for bowel obstruction. Electronically Signed   By: Charlett NoseKevin  Dover M.D.   On: 10-Oct-2018 20:19   Dg Chest Portable 1 View  Result Date: 03/15/2019 CLINICAL DATA:  Fever. Altered mental status. Exposure to COVID-19. EXAM: PORTABLE CHEST 1 VIEW COMPARISON:  02/19/2019 FINDINGS: Interval patchy airspace opacity throughout the right lung and lower half of the left lung. Possible small left pleural effusion. Stable enlarged cardiac silhouette and post CABG changes. Diffuse osteopenia. IMPRESSION: 1. Bilateral pneumonia, greater on the right. 2. Possible small left pleural effusion. 3. Stable cardiomegaly. Electronically Signed   By: Beckie SaltsSteven  Reid M.D.   On: 03/15/2019 20:09

## 2019-03-18 DIAGNOSIS — U071 COVID-19: Principal | ICD-10-CM

## 2019-03-18 DIAGNOSIS — Z515 Encounter for palliative care: Secondary | ICD-10-CM

## 2019-03-18 DIAGNOSIS — J1289 Other viral pneumonia: Secondary | ICD-10-CM

## 2019-03-18 DIAGNOSIS — R451 Restlessness and agitation: Secondary | ICD-10-CM

## 2019-03-18 LAB — CBC WITH DIFFERENTIAL/PLATELET
Abs Immature Granulocytes: 0.15 10*3/uL — ABNORMAL HIGH (ref 0.00–0.07)
Basophils Absolute: 0.1 10*3/uL (ref 0.0–0.1)
Basophils Relative: 0 %
Eosinophils Absolute: 0 10*3/uL (ref 0.0–0.5)
Eosinophils Relative: 0 %
HCT: 46.6 % — ABNORMAL HIGH (ref 36.0–46.0)
Hemoglobin: 15.6 g/dL — ABNORMAL HIGH (ref 12.0–15.0)
Immature Granulocytes: 1 %
Lymphocytes Relative: 4 %
Lymphs Abs: 0.8 10*3/uL (ref 0.7–4.0)
MCH: 26.3 pg (ref 26.0–34.0)
MCHC: 33.5 g/dL (ref 30.0–36.0)
MCV: 78.5 fL — ABNORMAL LOW (ref 80.0–100.0)
Monocytes Absolute: 1.3 10*3/uL — ABNORMAL HIGH (ref 0.1–1.0)
Monocytes Relative: 7 %
Neutro Abs: 16.9 10*3/uL — ABNORMAL HIGH (ref 1.7–7.7)
Neutrophils Relative %: 88 %
Platelets: 308 10*3/uL (ref 150–400)
RBC: 5.94 MIL/uL — ABNORMAL HIGH (ref 3.87–5.11)
RDW: 15.1 % (ref 11.5–15.5)
WBC: 19.1 10*3/uL — ABNORMAL HIGH (ref 4.0–10.5)
nRBC: 0 % (ref 0.0–0.2)

## 2019-03-18 LAB — D-DIMER, QUANTITATIVE: D-Dimer, Quant: 2.27 ug/mL-FEU — ABNORMAL HIGH (ref 0.00–0.50)

## 2019-03-18 LAB — URINE CULTURE: Culture: >=100000 — AB

## 2019-03-18 LAB — COMPREHENSIVE METABOLIC PANEL
ALT: 26 U/L (ref 0–44)
AST: 50 U/L — ABNORMAL HIGH (ref 15–41)
Albumin: 2.9 g/dL — ABNORMAL LOW (ref 3.5–5.0)
Alkaline Phosphatase: 40 U/L (ref 38–126)
Anion gap: 11 (ref 5–15)
BUN: 34 mg/dL — ABNORMAL HIGH (ref 8–23)
CO2: 23 mmol/L (ref 22–32)
Calcium: 8.4 mg/dL — ABNORMAL LOW (ref 8.9–10.3)
Chloride: 105 mmol/L (ref 98–111)
Creatinine, Ser: 1.07 mg/dL — ABNORMAL HIGH (ref 0.44–1.00)
GFR calc Af Amer: 50 mL/min — ABNORMAL LOW (ref >60)
GFR calc non Af Amer: 43 mL/min — ABNORMAL LOW (ref >60)
Glucose, Bld: 163 mg/dL — ABNORMAL HIGH (ref 70–99)
Potassium: 3.1 mmol/L — ABNORMAL LOW (ref 3.5–5.1)
Sodium: 139 mmol/L (ref 135–145)
Total Bilirubin: 0.4 mg/dL (ref 0.3–1.2)
Total Protein: 7.2 g/dL (ref 6.5–8.1)

## 2019-03-18 LAB — FERRITIN: Ferritin: 1482 ng/mL — ABNORMAL HIGH (ref 11–307)

## 2019-03-18 LAB — PROCALCITONIN: Procalcitonin: 0.61 ng/mL

## 2019-03-18 LAB — C-REACTIVE PROTEIN: CRP: 9.3 mg/dL — ABNORMAL HIGH (ref <1.0)

## 2019-03-18 MED ORDER — HALOPERIDOL LACTATE 5 MG/ML IJ SOLN
2.0000 mg | Freq: Once | INTRAMUSCULAR | Status: AC | PRN
  Administered 2019-03-18: 2 mg via INTRAVENOUS
  Filled 2019-03-18: qty 1

## 2019-03-18 MED ORDER — ATROPINE SULFATE 1 % OP SOLN
1.0000 [drp] | Freq: Four times a day (QID) | OPHTHALMIC | Status: DC | PRN
  Filled 2019-03-18: qty 2

## 2019-03-18 MED ORDER — HALOPERIDOL LACTATE 2 MG/ML PO CONC: 2.0000 mg | ORAL | Status: DC | PRN

## 2019-03-18 MED ORDER — ONDANSETRON HCL 4 MG/2ML IJ SOLN: 4.0000 mg | Freq: Four times a day (QID) | INTRAMUSCULAR | Status: DC | PRN

## 2019-03-18 MED ORDER — GLYCOPYRROLATE 0.2 MG/ML IJ SOLN
0.1000 mg | Freq: Three times a day (TID) | INTRAMUSCULAR | Status: DC
  Administered 2019-03-18 – 2019-03-20 (×9): 0.1 mg via INTRAVENOUS
  Filled 2019-03-18 (×9): qty 1

## 2019-03-18 MED ORDER — HALOPERIDOL 2 MG PO TABS: 2.0000 mg | ORAL_TABLET | ORAL | Status: DC | PRN

## 2019-03-18 MED ORDER — HYDROMORPHONE BOLUS VIA INFUSION
1.0000 mg | INTRAVENOUS | Status: DC | PRN
  Administered 2019-03-18 – 2019-03-19 (×2): 1 mg via INTRAVENOUS
  Filled 2019-03-18: qty 1

## 2019-03-18 MED ORDER — POTASSIUM CHLORIDE CRYS ER 20 MEQ PO TBCR
40.0000 meq | EXTENDED_RELEASE_TABLET | Freq: Every day | ORAL | Status: DC
  Administered 2019-03-18: 09:00:00 40 meq via ORAL
  Filled 2019-03-18: qty 2

## 2019-03-18 MED ORDER — HALOPERIDOL LACTATE 5 MG/ML IJ SOLN
5.0000 mg | INTRAMUSCULAR | Status: DC | PRN
  Administered 2019-03-18: 5 mg via INTRAVENOUS
  Filled 2019-03-18: qty 1

## 2019-03-18 MED ORDER — ACETAMINOPHEN 650 MG RE SUPP: 650.0000 mg | Freq: Four times a day (QID) | RECTAL | Status: DC | PRN

## 2019-03-18 MED ORDER — LORAZEPAM 2 MG/ML IJ SOLN: 1.0000 mg | INTRAMUSCULAR | Status: DC | PRN

## 2019-03-18 MED ORDER — ONDANSETRON 4 MG PO TBDP: 4.0000 mg | ORAL_TABLET | Freq: Four times a day (QID) | ORAL | Status: DC | PRN

## 2019-03-18 MED ORDER — DIPHENHYDRAMINE HCL 50 MG/ML IJ SOLN: 12.5000 mg | INTRAMUSCULAR | Status: DC | PRN

## 2019-03-18 MED ORDER — LORAZEPAM 2 MG/ML PO CONC: 1.0000 mg | ORAL | Status: DC | PRN

## 2019-03-18 MED ORDER — BIOTENE DRY MOUTH MT LIQD: 15.0000 mL | OROMUCOSAL | Status: DC | PRN

## 2019-03-18 MED ORDER — ACETAMINOPHEN 325 MG PO TABS: 650.0000 mg | ORAL_TABLET | Freq: Four times a day (QID) | ORAL | Status: DC | PRN

## 2019-03-18 MED ORDER — LORAZEPAM 0.5 MG PO TABS: 1.0000 mg | ORAL_TABLET | ORAL | Status: DC | PRN

## 2019-03-18 MED ORDER — HALOPERIDOL LACTATE 5 MG/ML IJ SOLN: 1.0000 mg | Freq: Four times a day (QID) | INTRAMUSCULAR | Status: DC | PRN

## 2019-03-18 MED ORDER — HYDROMORPHONE HCL 1 MG/ML IJ SOLN
1.0000 mg | Freq: Once | INTRAMUSCULAR | Status: AC
  Administered 2019-03-18: 1 mg via INTRAVENOUS
  Filled 2019-03-18: qty 1

## 2019-03-18 MED ORDER — HALOPERIDOL LACTATE 5 MG/ML IJ SOLN: 2.0000 mg | INTRAMUSCULAR | Status: DC | PRN

## 2019-03-18 MED ORDER — LORAZEPAM 2 MG/ML IJ SOLN
0.5000 mg | INTRAMUSCULAR | Status: DC | PRN
  Administered 2019-03-18: 0.5 mg via INTRAVENOUS
  Filled 2019-03-18: qty 1

## 2019-03-18 MED ORDER — SODIUM CHLORIDE 0.9 % IV SOLN
1.0000 mg/h | INTRAVENOUS | Status: DC
  Administered 2019-03-18 – 2019-03-20 (×2): 1 mg/h via INTRAVENOUS
  Filled 2019-03-18 (×2): qty 5

## 2019-03-18 NOTE — Progress Notes (Signed)
Pt has been awake all night, mitts placed to preserve IV and tele, she is also trying to take off her oxygen

## 2019-03-18 NOTE — Progress Notes (Signed)
Son called pt room phone this am. Attempted to talk to pt however pt was to agitated to comprehend her son was on the phone. MD called and spoke with family about prognosis of pt and comfort care has been decided. Son is in hospital and unable to visit, grandson who is decision maker does not want to visit at this time.

## 2019-03-18 NOTE — TOC Initial Note (Signed)
Transition of Care [facility] - Initial/Assessment Note    Patient Details  Name: Anita Ward MRN: [id number] Date of Birth: 1919-08-14  Transition of Care [facility]) CM/SW Contact:    Ninfa Meeker, RN Phone Number: 03/18/2019, 2:51 PM  Clinical Narrative:                         Patient Goals and CMS Choice        Expected Discharge Plan and Services                                                Prior Living Arrangements/Services                       Activities of Daily Living Home Assistive Devices/Equipment: Eyeglasses, Bedside commode/3-in-1 ADL Screening (condition at time of admission) Patient's cognitive ability adequate to safely complete daily activities?: No Is the patient deaf or have difficulty hearing?: Yes Does the patient have difficulty seeing, even when wearing glasses/contacts?: Yes Does the patient have difficulty concentrating, remembering, or making decisions?: Yes Patient able to express need for assistance with ADLs?: Yes Does the patient have difficulty dressing or bathing?: Yes Independently performs ADLs?: No Communication: Needs assistance Is this a change from baseline?: Change from baseline, expected to last >3 days Dressing (OT): Needs assistance Is this a change from baseline?: Change from baseline, expected to last >3 days Grooming: Dependent Feeding: Needs assistance Bathing: Needs assistance Toileting: Needs assistance In/Out Bed: Dependent Walks in Home: Appropriate for developmental age Does the patient have difficulty walking or climbing stairs?: Yes Weakness of Legs: Both Weakness of Arms/Hands: Both  Permission Sought/Granted                  Emotional Assessment              Admission diagnosis:  FEVER COVID 19 Patient Active Problem List   Diagnosis Date Noted  . Pneumonia due to COVID-19 virus 04-01-19  . CKD (chronic kidney disease) stage 3, GFR 30-59 ml/min (HCC) 2019-04-01  .  HTN (hypertension) 04-01-2019  . Chronic diastolic CHF (congestive heart failure) ([place]) 04-01-2019  . Acute respiratory failure with hypoxia ([place]) 01-Apr-2019  . NSTEMI (non-ST elevated myocardial infarction) ([place]) 01/14/2019  . Chest pain 02/19/2016  . Hyponatremia 02/19/2016  . MI (myocardial infarction) ([place]) 05/27/2015  . Acute lower UTI 03/12/2015   PCP:  Tracie Harrier, MD Pharmacy:   [place], [address] [place] [address] Phone: [phone] Fax: [phone]  [place], [place] [address] [place] [address] Phone: [phone] Fax: [phone]     Social Determinants of Health (SDOH) Interventions    Readmission Risk Interventions Readmission Risk Prevention Plan 01/15/2019  Transportation Screening Complete  PCP or Specialist Appt within 5-7 Days Complete  Home Care Screening Complete  Medication Review (RN CM) Complete  Some recent data might be hidden

## 2019-03-18 NOTE — Progress Notes (Signed)
Worsening-on 12 L of high flow oxygen this morning.  Very confused.  Hardly any oral intake.  Spoke with patient's son over the phone-since treatment is not working-patient is now confused, hypoxic-he is agreeable to transition to full comfort measures.

## 2019-03-18 NOTE — Plan of Care (Signed)
POC reviewed with DPOA via telephone

## 2019-03-18 NOTE — Progress Notes (Signed)
PROGRESS NOTE                                                                                                                                                                                                             Patient Demographics:    Anita Ward, is a 85 y.o. female, DOB - 08/15/1919, ZOX:[id number]  Outpatient Primary MD for the patient is Barbette ReichmannHande, Vishwanath, MD   Admit date - 03/31/2019   LOS - 2  No chief complaint on file.      Brief Narrative: Patient is a 85 y.o. female with PMHx of dementia, HTN,CAD s/p CABG presented with acute hypoxemic respiratory failure in setting of COVID-19 pneumonia.   Subjective:    Anita Ward today remains very confused-now on 12 L of fluid.  Hardly any oral intake.   Assessment  & Plan :   Acute Hypoxic Resp Failure due to Covid 19 Viral pneumonia: Worsening-with worsening appears to have terminal delirium at this point.  After extensive discussion with patient's son and grandson over the phone-we have agreed to transition to full comfort measures.  Stop steroids and Remdesivir.  Fever: afebrile  O2 requirements: On 12 l/m (high flow)  COVID-19 Labs: Recent Labs    04/03/2019 0550 03/17/19 0315 03/18/19 0320  DDIMER 2.26* 2.75* 2.27*  FERRITIN 717* 1,367* 1,482*  CRP 14.5* 13.1* 9.3*    Lab Results  Component Value Date   SARSCOV2NAA POSITIVE (A) 03/15/2019   SARSCOV2NAA NEGATIVE 02/19/2019   SARSCOV2NAA NEGATIVE 01/13/2019     COVID-19 Medications: Steroids: 9/5>>9/8 Remdesivir: 9/6>>9/8 Actemra: Not given Convalescent Plasma:N/A Research Studies:N/A  Other medications: Diuretics:Euvolemic-oral Lasix has been continued. Antibiotics:  Rocephin (9/6>>9/8) for presumed UTI  Prone/Incentive Spirometry: Limited by confusion/dementia.  DVT Prophylaxis  :  Lovenox -we will stop as transition to full comfort measures.  Acute metabolic encephalopathy:  Secondary to COVID-19/hypoxemia/UTI-this is in the background of dementia.  Very confused-may have developed terminal delirium.  Try Haldol today-if no response has been will speak with palliative care regarding other agents.  Klebsiella pneumonia UTI: Urine culture positive for Klebsiella pneumonia-no longer on Rocephin as patient has been transitioned to full comfort measures   Chronic diastolic heart failure: Appears euvolemic  CAD s/p CABG: Since on comfort measures-no longer on antiplatelets.   HTN: Since on comfort measures-no longer on antihypertensives.  Hypothyroidism:  Since on comfort measures-no longer on levothyroxine  ABG:    Component Value Date/Time   HCO3 29.1 (H) 03/15/2019 1944   O2SAT 89.2 03/15/2019 1944    Vent Settings:    Condition -critically ill-transition to comfort measures  Family Communication : Spoke with patient's son and then with the grandson over the phone-both agreed to transition to full dose given clinical deterioration  Code Status :  DNR  Diet :  Diet Order    None       Disposition Plan  :  Remain hospitalized-suspect inpatient death in the next few days.  Consults  :  None  Procedures  :  None  Antibiotics  :    Anti-infectives (From admission, onward)   Start     Dose/Rate Route Frequency Ordered Stop   03/17/19 0800  remdesivir 100 mg in sodium chloride 0.9 % 250 mL IVPB  Status:  Discontinued     100 mg 500 mL/hr over 30 Minutes Intravenous Every 24 hours 04/05/2019 0302 03/18/19 1005   03/14/2019 2200  cefTRIAXone (ROCEPHIN) 1 g in sodium chloride 0.9 % 100 mL IVPB  Status:  Discontinued     1 g 200 mL/hr over 30 Minutes Intravenous Every 24 hours 03/27/2019 0237 03/18/19 1005   03/19/2019 0400  remdesivir 200 mg in sodium chloride 0.9 % 250 mL IVPB     200 mg 500 mL/hr over 30 Minutes Intravenous Once 03/12/2019 0302 04/06/2019 0413      Inpatient Medications  Scheduled Meds:  Continuous Infusions: . HYDROmorphone      PRN Meds:.acetaminophen **OR** acetaminophen, antiseptic oral rinse, diphenhydrAMINE, [DISCONTINUED] haloperidol **OR** [DISCONTINUED] haloperidol **OR** haloperidol lactate, HYDROmorphone, LORazepam **OR** LORazepam **OR** LORazepam, LORazepam, ondansetron **OR** ondansetron (ZOFRAN) IV   Time Spent in minutes 35  See all Orders from today for further details   Jeoffrey Massed M.D on 03/18/2019 at 10:33 AM  To page go to [email or web address] - use universal password  [facility] -  Office  [phone]    Objective:   Vitals:   03/18/19 0500 03/18/19 0847 03/18/19 0900 03/18/19 1000  BP:  (!) 159/112 (!) 156/92 (!) 148/120  Pulse:  95 (!) 56 (!) 114  Resp:  20  20  Temp:  (!) 96.2 F (35.7 C)    TempSrc:  Rectal  Rectal  SpO2: 92% 92% 93% 90%  Weight:      Height:        Wt Readings from Last 3 Encounters:  03/26/2019 58.9 kg  03/15/19 59 kg  02/20/19 58.2 kg     Intake/Output Summary (Last 24 hours) at 03/18/2019 1033 Last data filed at 03/18/2019 0900 Gross per 24 hour  Intake 1337.29 ml  Output 300 ml  Net 1037.29 ml     Physical Exam Gen Exam: Very confused-tachypnea and in obvious distress.  HEENT:atraumatic, normocephalic Chest: Transmitted airway sounds anteriorly-but appears to have rales up to midlung bilaterally  CVS:S1S2 regular-tachycardic Abdomen:soft non tender, non distended Extremities:no edema Neurology: Non focal Skin: no rash   Data Review:    CBC Recent Labs  Lab 03/15/19 1944 03/30/2019 0550 03/17/19 0315 03/18/19 0320  WBC 9.9 5.5 13.4* 19.1*  HGB 12.4 12.7 14.1 15.6*  HCT 37.5 40.0 43.2 46.6*  PLT 167 138* 222 308  MCV 79.6* 83.0 79.9* 78.5*  MCH 26.3 26.3 26.1 26.3  MCHC 33.1 31.8 32.6 33.5  RDW 14.6 14.7 14.9 15.1  LYMPHSABS 0.9 0.6* 0.8 0.8  MONOABS 0.5 0.2 0.8 1.3*  EOSABS 0.0  0.0 0.0 0.0  BASOSABS 0.0 0.0 0.0 0.1    Chemistries  Recent Labs  Lab 03/15/19 1944 11/05/2018 0550 03/17/19 0315 03/18/19 0320  NA 132*  138 137 139  K 4.6 3.9 3.1* 3.1*  CL 98 101 101 105  CO2 23 24 23 23   GLUCOSE 122* 147* 122* 163*  BUN 32* 29* 35* 34*  CREATININE 1.22* 1.14* 1.10* 1.07*  CALCIUM 8.6* 8.3* 8.4* 8.4*  MG  --  2.3  --   --   AST 47* 44* 48* 50*  ALT 24 24 26 26   ALKPHOS 35* 36* 37* 40  BILITOT 0.7 0.4 0.6 0.4   ------------------------------------------------------------------------------------------------------------------ No results for input(s): CHOL, HDL, LDLCALC, TRIG, CHOLHDL, LDLDIRECT in the last 72 hours.  Lab Results  Component Value Date   HGBA1C 5.2 05/27/2015   ------------------------------------------------------------------------------------------------------------------ No results for input(s): TSH, T4TOTAL, T3FREE, THYROIDAB in the last 72 hours.  Invalid input(s): FREET3 ------------------------------------------------------------------------------------------------------------------ Recent Labs    03/17/19 0315 03/18/19 0320  FERRITIN 1,367* 1,482*    Coagulation profile Recent Labs  Lab 03/15/19 1944  INR 1.1    Recent Labs    03/17/19 0315 03/18/19 0320  DDIMER 2.75* 2.27*    Cardiac Enzymes No results for input(s): CKMB, TROPONINI, MYOGLOBIN in the last 168 hours.  Invalid input(s): CK ------------------------------------------------------------------------------------------------------------------    Component Value Date/Time   BNP 780.0 (H) 02/19/2019 1331    Micro Results Recent Results (from the past 240 hour(s))  SARS Coronavirus 2 [facility](Hospital order, Performed in [facility] hospital lab) Nasopharyngeal Nasopharyngeal Swab     Status: Abnormal   Collection Time: 03/15/19  7:44 PM   Specimen: Nasopharyngeal Swab  Result Value Ref Range Status   SARS Coronavirus 2 POSITIVE (A) NEGATIVE Final    Comment: RESULT CALLED TO, READ BACK BY AND VERIFIED WITH: LYNN,R AT 2141 ON 03/15/2019 BY JPM (NOTE) If result is NEGATIVE SARS-CoV-2 target nucleic  acids are NOT DETECTED. The SARS-CoV-2 RNA is generally detectable in upper and lower  respiratory specimens during the acute phase of infection. The lowest  concentration of SARS-CoV-2 viral copies this assay can detect is 250  copies / mL. A negative result does not preclude SARS-CoV-2 infection  and should not be used as the sole basis for treatment or other  patient management decisions.  A negative result may occur with  improper specimen collection / handling, submission of specimen other  than nasopharyngeal swab, presence of viral mutation(s) within the  areas targeted by this assay, and inadequate number of viral copies  (<250 copies / mL). A negative result must be combined with clinical  observations, patient history, and epidemiological information. If result is POSITIVE SARS-CoV-2 target nucleic acids are DETECTED. The  SARS-CoV-2 RNA is generally detectable in upper and lower  respiratory specimens during the acute phase of infection.  Positive  results are indicative of active infection with SARS-CoV-2.  Clinical  correlation with patient history and other diagnostic information is  necessary to determine patient infection status.  Positive results do  not rule out bacterial infection or co-infection with other viruses. If result is PRESUMPTIVE POSTIVE SARS-CoV-2 nucleic acids MAY BE PRESENT.   A presumptive positive result was obtained on the submitted specimen  and confirmed on repeat testing.  While 2019 novel coronavirus  (SARS-CoV-2) nucleic acids may be present in the submitted sample  additional confirmatory testing may be necessary for epidemiological  and / or clinical management purposes  to differentiate between  SARS-CoV-2 and other  Sarbecovirus currently known to infect humans.  If clinically indicated additional testing with an alternate test  methodology [phone] is a dvised. The SARS-CoV-2 RNA is generally  detectable in upper and lower respiratory  specimens during the acute  phase of infection. The expected result is Negative. Fact Sheet for Patients:  BoilerBrush.com.cyhttps://www.fda.gov/media/136312/download Fact Sheet for Healthcare Providers: https://pope.com/https://www.fda.gov/media/136313/download This test is not yet approved or cleared by the [place] FDA and has been authorized for detection and/or diagnosis of SARS-CoV-2 by FDA under an Emergency Use Authorization (EUA).  This EUA will remain in effect (meaning this test can be used) for the duration of the COVID-19 declaration under Section 564(b)(1) of the Act, 21 U.S.C. section 360bbb-3(b)(1), unless the authorization is terminated or revoked sooner. Performed at [facility], [address] [place], [place] [address]   Blood culture (routine x 2)     Status: None (Preliminary result)   Collection Time: 03/15/19  7:44 PM   Specimen: BLOOD  Result Value Ref Range Status   Specimen Description BLOOD LEFT FOREARM  Final   Special Requests   Final    BOTTLES DRAWN AEROBIC AND ANAEROBIC Blood Culture results may not be optimal due to an excessive volume of blood received in culture bottles   Culture   Final    NO GROWTH 3 DAYS Performed at [facility], [address] [place], [place] [address]    Report Status PENDING  Incomplete  Blood culture (routine x 2)     Status: None (Preliminary result)   Collection Time: 03/15/19  7:44 PM   Specimen: BLOOD  Result Value Ref Range Status   Specimen Description BLOOD RIGHT HAND  Final   Special Requests   Final    BOTTLES DRAWN AEROBIC AND ANAEROBIC Blood Culture adequate volume   Culture   Final    NO GROWTH 3 DAYS Performed at [facility], [address] [place], [place] [address]    Report Status PENDING  Incomplete  Urine Culture     Status: Abnormal (Preliminary result)   Collection Time: 03/15/19  7:44 PM   Specimen: Urine, Random  Result Value Ref Range Status   Specimen Description   Final    URINE,  RANDOM Performed at [facility], [address] [place], [place] [address]    Special Requests   Final    NONE Performed at [facility], [address] [place], [place] [address]    Culture (A)  Final    >=100,000 COLONIES/mL KLEBSIELLA PNEUMONIAE SUSCEPTIBILITIES TO FOLLOW Performed at [facility], [address] [place], [place] [address]    Report Status PENDING  Incomplete    Radiology Reports Dg Chest 2 View  Result Date: 02/19/2019 CLINICAL DATA:  Shortness of breath EXAM: CHEST - 2 VIEW COMPARISON:  December 25, 2017 FINDINGS: The heart size remains enlarged. Aortic calcifications are noted. The patient is status post prior median sternotomy. There are chronic appearing airspace opacities at the lung bases bilaterally. There is no pneumothorax. There appear to be new small bilateral pleural effusions. There is no acute osseous abnormality. There is diffuse osteopenia involving the thoracic spine. There is height loss of several lower thoracic vertebral bodies. IMPRESSION: 1. Cardiomegaly with small bilateral pleural effusions. There is likely some mild vascular congestion. 2. Chronic lung changes at the lung bases bilaterally favored to represent scarring or atelectasis. Electronically Signed   By: Katherine Mantlehristopher  Green M.D.   On: 02/19/2019 14:30  Dg Abd 1 View  Result Date: 03/29/2019 CLINICAL DATA:  Abdominal distention EXAM: ABDOMEN - 1 VIEW COMPARISON:  05/15/2015 FINDINGS: Gaseous distention of bowel in the lower abdomen and pelvis, likely sigmoid colon. Moderate gaseous distention of the cecum. No small bowel dilatation. Prior cholecystectomy. No organomegaly or free air. IMPRESSION: Mild gaseous distention seen within the right colon and sigmoid colon. No convincing evidence for bowel obstruction. Electronically Signed   By: Rolm Baptise M.D.   On: 04/08/2019 20:19   Dg Chest Portable 1 View  Result Date: 03/15/2019 CLINICAL DATA:  Fever. Altered  mental status. Exposure to COVID-19. EXAM: PORTABLE CHEST 1 VIEW COMPARISON:  02/19/2019 FINDINGS: Interval patchy airspace opacity throughout the right lung and lower half of the left lung. Possible small left pleural effusion. Stable enlarged cardiac silhouette and post CABG changes. Diffuse osteopenia. IMPRESSION: 1. Bilateral pneumonia, greater on the right. 2. Possible small left pleural effusion. 3. Stable cardiomegaly. Electronically Signed   By: Claudie Revering M.D.   On: 03/15/2019 20:09

## 2019-03-18 NOTE — Consult Note (Signed)
Palliative Care Consult Note  Reason for Consult: Symptom management in actively dying patient, terminal delirium  Palliative care consult received.  Discussed case with Dr. Sloan Leiter.    Chart reviewed including personal review of pertinent labs and imaging.  Briefly, Ms. Kalmar is a 85 yo female with PMHx dementia, HTN, CAD s/p CABG who presented with respiratory failure related to COVID-19.  Her clinical course has continued to decline and decision was made to transition to full comfort care.    Discussed with Dr. Sloan Leiter and Ms. Eifler is displaying signs of terminal agitation.  We discussed changes to her management this morning.  I agree with plan including rotation of opioid to hydromorphone with plan for continuous infusion to ensure comfort and initiation of higher dose of haldol (5mg  every 4 hours as needed).  - I checked in with her nurse who reports that she has been resting more comfortably with this regimen.  She will contact me if any further signs of distress.  - MAR reviewed this evening.  She has not received any further doses of haldol and has required only on breakthrough dose of hydromorphone since starting on hydromorphone infusion.  I would continue with same regimen for now. - I called and discussed formulary options with pharmacist.  Greatly appreciate pharmacy assistance.  If terminal agitation recurs and haldol ineffective, would recommend thorazine 25mg  IV every 6 hours as needed as next line therapy.  Could also consider geodon 10-20mg  IM if needed, particularly if she loses IV access.  Total time: 45 minutes Greater than 50%  of this time was spent counseling and coordinating care related to the above assessment and plan.  Micheline Rough, MD [place] Team [phone]

## 2019-03-18 NOTE — Progress Notes (Signed)
Ativan 0.5mg given

## 2019-03-18 NOTE — Progress Notes (Signed)
0400 Pt has become very agitated, trying to throw her legs off the bed and yelling. She has become very tachycardic in the 130's and O2 sats in the low 80's. 6L/HFNC placed. 3235 Dr Olevia Bowens paged and orders given for haldol and ativan, labetolol given for BP of 178/128 0500 BP now 145/72, HR in 80-90's, )2 sats 88-92% on 6L/HFNC. Pt is still trying to get out of bed altho she is very groggy. Will assess need for ativan in another 20 mins.

## 2019-03-18 NOTE — Progress Notes (Addendum)
OT Cancellation Note  Patient Details Name: Anita Ward MRN: [id number] DOB: 09-11-1919   Cancelled Treatment:    Reason Eval/Treat Not Completed: Other (comment); noted and acknowledge discontinued OT orders, per chart pt transitioning to comfort care. Acute OT to sign off at this time. Please re-consult if pt's needs change.  Lou Cal, OT [facility] Pager [phone] Office [phone]   [name] 03/18/2019, 11:47 AM

## 2019-03-19 NOTE — Progress Notes (Signed)
Son, Anita Ward called and updated on patient condition.  Son appreciative of update and asks to be kept updated.

## 2019-03-19 NOTE — TOC Progression Note (Signed)
Transition of Care [facility]) - Progression Note    Patient Details  Name: Anita Ward MRN: [id number] Date of Birth: 03/31/1920  Transition of [facility]) CM/SW Contact  Purcell Mouton, RN Phone Number: 03/19/2019, 11:39 AM  Clinical Narrative:    MD states in hospital death in a few hours. May God Bless her.        Expected Discharge Plan and Services                                                 Social Determinants of Health (SDOH) Interventions    Readmission Risk Interventions Readmission Risk Prevention Plan 01/15/2019  Transportation Screening Complete  PCP or Specialist Appt within 5-7 Days Complete  Home Care Screening Complete  Medication Review (RN CM) Complete  Some recent data might be hidden

## 2019-03-19 NOTE — Progress Notes (Signed)
PROGRESS NOTE                                                                                                                                                                                                             Patient Demographics:    Alany Borman, is a 85 y.o. female, DOB - Dec 19, 1919, SWH:[id number]  Outpatient Primary MD for the patient is Tracie Harrier, MD   Admit date - 11-Apr-2019   LOS - 3  No chief complaint on file.      Brief Narrative: Patient is a 85 y.o. female with PMHx of dementia, HTN,CAD s/p CABG presented with acute hypoxemic respiratory failure in setting of COVID-19 pneumonia.   Subjective:    Hani Patnode today remains unresponsive-on Dilaudid infusion-having agonal spells-respiratory rate around 5-6/min.   Assessment  & Plan :   Acute Hypoxic Resp Failure due to Covid 19 Viral pneumonia: Worsened during this hospital stay-started having terminal delirium-after extensive discussion with family-started on full comfort measures.  No longer on steroids or Remdesivir.  Continue Dilaudid infusion.  COVID-19 Medications: Steroids: 9/5>>9/8 Remdesivir: 9/6>>9/8 Actemra: Not given Convalescent Plasma:N/A Research Studies:N/A  Other medications: Diuretics:Euvolemic-oral Lasix has been continued. Antibiotics:  Rocephin (9/6>>9/8) for presumed UTI  Prone/Incentive Spirometry: Limited by confusion/dementia.  DVT Prophylaxis  :  Lovenox -we will stop as transition to full comfort measures.  Acute metabolic encephalopathy: Secondary to COVID-19/hypoxemia/UTI-this is in the background of dementia.  After combination of Ativan, Dilaudid and Haldol-better control of her delirium-currently on a Dilaudid infusion and comfortable.  Klebsiella pneumonia UTI: Urine culture positive for Klebsiella pneumonia-no longer on Rocephin as patient has been transitioned to full comfort measures   Chronic  diastolic heart failure: Appears euvolemic  CAD s/p CABG: Since on comfort measures-no longer on antiplatelets.   HTN: Since on comfort measures-no longer on antihypertensives.  Hypothyroidism: Since on comfort measures-no longer on levothyroxine  ABG:    Component Value Date/Time   HCO3 29.1 (H) 03/15/2019 1944   O2SAT 89.2 03/15/2019 1944    Vent Settings:    Condition -critically ill- comfort measures  Family Communication : Left message for patient's son but updated grandson over the phone.     Code Status :  DNR  Diet :  Diet Order    None       Disposition Plan  :  Remain hospitalized-suspect inpatient death in a few hours   Consults  :  None  Procedures  :  None  Antibiotics  :    Anti-infectives (From admission, onward)   Start     Dose/Rate Route Frequency Ordered Stop   03/17/19 0800  remdesivir 100 mg in sodium chloride 0.9 % 250 mL IVPB  Status:  Discontinued     100 mg 500 mL/hr over 30 Minutes Intravenous Every 24 hours 03/11/2019 0302 03/18/19 1005   03/13/2019 2200  cefTRIAXone (ROCEPHIN) 1 g in sodium chloride 0.9 % 100 mL IVPB  Status:  Discontinued     1 g 200 mL/hr over 30 Minutes Intravenous Every 24 hours 03/15/2019 0237 03/18/19 1005   04/09/2019 0400  remdesivir 200 mg in sodium chloride 0.9 % 250 mL IVPB     200 mg 500 mL/hr over 30 Minutes Intravenous Once 04/01/2019 0302 04/01/2019 0413      Inpatient Medications  Scheduled Meds: . glycopyrrolate  0.1 mg Intravenous TID   Continuous Infusions: . HYDROmorphone 1 mg/hr (03/19/19 0549)   PRN Meds:.acetaminophen **OR** acetaminophen, antiseptic oral rinse, atropine, diphenhydrAMINE, [DISCONTINUED] haloperidol **OR** [DISCONTINUED] haloperidol **OR** haloperidol lactate, HYDROmorphone, LORazepam **OR** LORazepam **OR** LORazepam, LORazepam, ondansetron **OR** ondansetron (ZOFRAN) IV   Time Spent in minutes 15  See all Orders from today for further details   Jeoffrey Massed M.D on 03/19/2019  at 9:33 AM  To page go to [email or web address] - use universal password  [facility] -  Office  [phone]    Objective:   Vitals:   03/18/19 1600 03/18/19 2010 03/18/19 2330 03/19/19 0541  BP:  115/65  (!) 150/76  Pulse: 76 99 62 65  Resp: 12   11  Temp:  (!) 95.9 F (35.5 C)  (!) 96.3 F (35.7 C)  TempSrc:  Rectal  Rectal  SpO2: 94% 94%  98%  Weight:      Height:        Wt Readings from Last 3 Encounters:  03/27/2019 58.9 kg  03/15/19 59 kg  02/20/19 58.2 kg     Intake/Output Summary (Last 24 hours) at 03/19/2019 0933 Last data filed at 03/19/2019 0549 Gross per 24 hour  Intake 11.65 ml  Output 1050 ml  Net -1038.35 ml     Physical Exam Gen Exam: Unresponsive-appears comfortable and not in any distress   Data Review:    CBC Recent Labs  Lab 03/15/19 1944 04/06/2019 0550 03/17/19 0315 03/18/19 0320  WBC 9.9 5.5 13.4* 19.1*  HGB 12.4 12.7 14.1 15.6*  HCT 37.5 40.0 43.2 46.6*  PLT 167 138* 222 308  MCV 79.6* 83.0 79.9* 78.5*  MCH 26.3 26.3 26.1 26.3  MCHC 33.1 31.8 32.6 33.5  RDW 14.6 14.7 14.9 15.1  LYMPHSABS 0.9 0.6* 0.8 0.8  MONOABS 0.5 0.2 0.8 1.3*  EOSABS 0.0 0.0 0.0 0.0  BASOSABS 0.0 0.0 0.0 0.1    Chemistries  Recent Labs  Lab 03/15/19 1944 03/13/2019 0550 03/17/19 0315 03/18/19 0320  NA 132* 138 137 139  K 4.6 3.9 3.1* 3.1*  CL 98 101 101 105  CO2 23 24 23 23   GLUCOSE 122* 147* 122* 163*  BUN 32* 29* 35* 34*  CREATININE 1.22* 1.14* 1.10* 1.07*  CALCIUM 8.6* 8.3* 8.4* 8.4*  MG  --  2.3  --   --   AST 47* 44* 48* 50*  ALT 24 24 26 26   ALKPHOS 35* 36* 37* 40  BILITOT 0.7 0.4 0.6 0.4   ------------------------------------------------------------------------------------------------------------------ No  results for input(s): CHOL, HDL, LDLCALC, TRIG, CHOLHDL, LDLDIRECT in the last 72 hours.  Lab Results  Component Value Date   HGBA1C 5.2 05/27/2015    ------------------------------------------------------------------------------------------------------------------ No results for input(s): TSH, T4TOTAL, T3FREE, THYROIDAB in the last 72 hours.  Invalid input(s): FREET3 ------------------------------------------------------------------------------------------------------------------ Recent Labs    03/17/19 0315 03/18/19 0320  FERRITIN 1,367* 1,482*    Coagulation profile Recent Labs  Lab 03/15/19 1944  INR 1.1    Recent Labs    03/17/19 0315 03/18/19 0320  DDIMER 2.75* 2.27*    Cardiac Enzymes No results for input(s): CKMB, TROPONINI, MYOGLOBIN in the last 168 hours.  Invalid input(s): CK ------------------------------------------------------------------------------------------------------------------    Component Value Date/Time   BNP 780.0 (H) 02/19/2019 1331    Micro Results Recent Results (from the past 240 hour(s))  SARS Coronavirus 2 [facility](Hospital order, Performed in [facility] hospital lab) Nasopharyngeal Nasopharyngeal Swab     Status: Abnormal   Collection Time: 03/15/19  7:44 PM   Specimen: Nasopharyngeal Swab  Result Value Ref Range Status   SARS Coronavirus 2 POSITIVE (A) NEGATIVE Final    Comment: RESULT CALLED TO, READ BACK BY AND VERIFIED WITH: LYNN,R AT 2141 ON 03/15/2019 BY JPM (NOTE) If result is NEGATIVE SARS-CoV-2 target nucleic acids are NOT DETECTED. The SARS-CoV-2 RNA is generally detectable in upper and lower  respiratory specimens during the acute phase of infection. The lowest  concentration of SARS-CoV-2 viral copies this assay can detect is 250  copies / mL. A negative result does not preclude SARS-CoV-2 infection  and should not be used as the sole basis for treatment or other  patient management decisions.  A negative result may occur with  improper specimen collection / handling, submission of specimen other  than nasopharyngeal swab, presence of viral mutation(s) within the  areas  targeted by this assay, and inadequate number of viral copies  (<250 copies / mL). A negative result must be combined with clinical  observations, patient history, and epidemiological information. If result is POSITIVE SARS-CoV-2 target nucleic acids are DETECTED. The  SARS-CoV-2 RNA is generally detectable in upper and lower  respiratory specimens during the acute phase of infection.  Positive  results are indicative of active infection with SARS-CoV-2.  Clinical  correlation with patient history and other diagnostic information is  necessary to determine patient infection status.  Positive results do  not rule out bacterial infection or co-infection with other viruses. If result is PRESUMPTIVE POSTIVE SARS-CoV-2 nucleic acids MAY BE PRESENT.   A presumptive positive result was obtained on the submitted specimen  and confirmed on repeat testing.  While 2019 novel coronavirus  (SARS-CoV-2) nucleic acids may be present in the submitted sample  additional confirmatory testing may be necessary for epidemiological  and / or clinical management purposes  to differentiate between  SARS-CoV-2 and other Sarbecovirus currently known to infect humans.  If clinically indicated additional testing with an alternate test  methodology [phone]([id number]) is a dvised. The SARS-CoV-2 RNA is generally  detectable in upper and lower respiratory specimens during the acute  phase of infection. The expected result is Negative. Fact Sheet for Patients:  BoilerBrush.com.cyhttps://www.fda.gov/media/136312/download Fact Sheet for Healthcare Providers: https://pope.com/https://www.fda.gov/media/136313/download This test is not yet approved or cleared by the [place] FDA and has been authorized for detection and/or diagnosis of SARS-CoV-2 by FDA under an Emergency Use Authorization (EUA).  This EUA will remain in effect (meaning this test can be used) for the duration of the COVID-19 declaration under Section 564(b)(1) of  the Act, 21 U.S.C. section  360bbb-3(b)(1), unless the authorization is terminated or revoked sooner. Performed at [facility], [address] [place], [place] [address]   Blood culture (routine x 2)     Status: None (Preliminary result)   Collection Time: 03/15/19  7:44 PM   Specimen: BLOOD  Result Value Ref Range Status   Specimen Description BLOOD LEFT FOREARM  Final   Special Requests   Final    BOTTLES DRAWN AEROBIC AND ANAEROBIC Blood Culture results may not be optimal due to an excessive volume of blood received in culture bottles   Culture   Final    NO GROWTH 4 DAYS Performed at [facility], [address] [place], [place] [address]    Report Status PENDING  Incomplete  Blood culture (routine x 2)     Status: None (Preliminary result)   Collection Time: 03/15/19  7:44 PM   Specimen: BLOOD  Result Value Ref Range Status   Specimen Description BLOOD RIGHT HAND  Final   Special Requests   Final    BOTTLES DRAWN AEROBIC AND ANAEROBIC Blood Culture adequate volume   Culture   Final    NO GROWTH 4 DAYS Performed at [facility], [address] [place], [place] [address]    Report Status PENDING  Incomplete  Urine Culture     Status: Abnormal   Collection Time: 03/15/19  7:44 PM   Specimen: Urine, Random  Result Value Ref Range Status   Specimen Description   Final    URINE, RANDOM Performed at [facility], [address] [place], [place] [address]    Special Requests   Final    NONE Performed at [facility], [address] [place], [place] [address]    Culture >=100,000 COLONIES/mL KLEBSIELLA PNEUMONIAE (A)  Final   Report Status 03/18/2019 FINAL  Final   Organism ID, Bacteria KLEBSIELLA PNEUMONIAE (A)  Final      Susceptibility   Klebsiella pneumoniae - MIC*    AMPICILLIN RESISTANT Resistant     CEFAZOLIN <=4 SENSITIVE Sensitive     CEFTRIAXONE <=1 SENSITIVE Sensitive     CIPROFLOXACIN <=0.25 SENSITIVE Sensitive     GENTAMICIN <=1  SENSITIVE Sensitive     IMIPENEM <=0.25 SENSITIVE Sensitive     NITROFURANTOIN <=16 SENSITIVE Sensitive     TRIMETH/SULFA <=20 SENSITIVE Sensitive     AMPICILLIN/SULBACTAM 4 SENSITIVE Sensitive     PIP/TAZO <=4 SENSITIVE Sensitive     Extended ESBL NEGATIVE Sensitive     * >=100,000 COLONIES/mL KLEBSIELLA PNEUMONIAE    Radiology Reports Dg Chest 2 View  Result Date: 02/19/2019 CLINICAL DATA:  Shortness of breath EXAM: CHEST - 2 VIEW COMPARISON:  December 25, 2017 FINDINGS: The heart size remains enlarged. Aortic calcifications are noted. The patient is status post prior median sternotomy. There are chronic appearing airspace opacities at the lung bases bilaterally. There is no pneumothorax. There appear to be new small bilateral pleural effusions. There is no acute osseous abnormality. There is diffuse osteopenia involving the thoracic spine. There is height loss of several lower thoracic vertebral bodies. IMPRESSION: 1. Cardiomegaly with small bilateral pleural effusions. There is likely some mild vascular congestion. 2. Chronic lung changes at the lung bases bilaterally favored to represent scarring or atelectasis. Electronically Signed   By: Katherine Mantlehristopher  Green M.D.   On: 02/19/2019 14:30   Dg Abd 1 View  Result Date: 03/22/2019 CLINICAL DATA:  Abdominal distention EXAM: ABDOMEN - 1 VIEW  COMPARISON:  05/15/2015 FINDINGS: Gaseous distention of bowel in the lower abdomen and pelvis, likely sigmoid colon. Moderate gaseous distention of the cecum. No small bowel dilatation. Prior cholecystectomy. No organomegaly or free air. IMPRESSION: Mild gaseous distention seen within the right colon and sigmoid colon. No convincing evidence for bowel obstruction. Electronically Signed   By: Charlett NoseKevin  Dover M.D.   On: 03/12/2019 20:19   Dg Chest Portable 1 View  Result Date: 03/15/2019 CLINICAL DATA:  Fever. Altered mental status. Exposure to COVID-19. EXAM: PORTABLE CHEST 1 VIEW COMPARISON:  02/19/2019 FINDINGS:  Interval patchy airspace opacity throughout the right lung and lower half of the left lung. Possible small left pleural effusion. Stable enlarged cardiac silhouette and post CABG changes. Diffuse osteopenia. IMPRESSION: 1. Bilateral pneumonia, greater on the right. 2. Possible small left pleural effusion. 3. Stable cardiomegaly. Electronically Signed   By: Beckie SaltsSteven  Reid M.D.   On: 03/15/2019 20:09

## 2019-03-20 LAB — CULTURE, BLOOD (ROUTINE X 2)
Culture: NO GROWTH
Culture: NO GROWTH
Special Requests: ADEQUATE

## 2019-03-20 MED ORDER — SODIUM CHLORIDE 0.9 % IV SOLN
INTRAVENOUS | Status: DC | PRN
  Administered 2019-03-20: 250 mL via INTRAVENOUS

## 2019-03-20 NOTE — Progress Notes (Signed)
Call received from grandson Cyndi Lennert.  He confirms his uncle Patrick Jupiter is in ICU at [facility] and has been receiving updates.  He requested update on patient.  I advised him that patient was actively dying and advised him he could visit if he wanted as the primary is in ICU, and that she may not make it through the night.  He is unable to come to the hospital at this time.  He requested to have phone placed near her ear so he could talk to her.    After he spoke with pt, I let him know if he is able to come in to see her to call us and let us know.  He voiced understanding.

## 2019-03-20 NOTE — Progress Notes (Signed)
Telephone call to patient's son, Anita Ward, updated on plan of care for shift and answered all questions.

## 2019-03-20 NOTE — Progress Notes (Signed)
Palliative Care Brief Note  Discussed with bedside RN.   Ms. Caison remains comfortable on current regimen.  Continue same.  Please call [phone] if terminal agitation recurs and is not controlled by current medications.  Anticipate hospital death.    Micheline Rough, MD [facility] Team [phone]  NO CHARGE NOTE

## 2019-03-20 NOTE — Progress Notes (Signed)
PROGRESS NOTE                                                                                                                                                                                                             Patient Demographics:    Anita Ward, is a 85 y.o. female, DOB - 18-Oct-1919, ZOX:[id number]  Outpatient Primary MD for the patient is Tracie Harrier, MD   Admit date - March 27, 2019   LOS - 4  No chief complaint on file.      Brief Narrative: Patient is a 85 y.o. female with PMHx of dementia, HTN,CAD s/p CABG presented with acute hypoxemic respiratory failure in setting of COVID-19 pneumonia.   Subjective:    Anita Ward today remains unresponsive-continues to have agonal spells-respiratory rate is around 6-8 breaths/min.  Around 5 L of oxygen this morning.   Assessment  & Plan :   Acute Hypoxic Resp Failure due to Covid 19 Viral pneumonia: Worsened during this hospital stay-started having terminal delirium-after extensive discussion with family-started on full comfort measures.  No longer on steroids or Remdesivir.  Continue Dilaudid infusion.  COVID-19 Medications: Steroids: 9/5>>9/8 Remdesivir: 9/6>>9/8 Actemra: Not given Convalescent Plasma:N/A Research Studies:N/A  Other medications: Diuretics:Euvolemic-oral Lasix has been continued. Antibiotics:  Rocephin (9/6>>9/8) for presumed UTI  Prone/Incentive Spirometry: Limited by confusion/dementia.  DVT Prophylaxis  :  Lovenox -we will stop as transition to full comfort measures.  Acute metabolic encephalopathy: Secondary to COVID-19/hypoxemia/UTI-this is in the background of dementia.  After combination of Ativan, Dilaudid and Haldol-better control of her delirium-currently on a Dilaudid infusion and comfortable.  Klebsiella pneumonia UTI: Urine culture positive for Klebsiella pneumonia-no longer on Rocephin as patient has been transitioned to full  comfort measures   Chronic diastolic heart failure: Appears euvolemic  CAD s/p CABG: Since on comfort measures-no longer on antiplatelets.   HTN: Since on comfort measures-no longer on antihypertensives.  Hypothyroidism: Since on comfort measures-no longer on levothyroxine  ABG:    Component Value Date/Time   HCO3 29.1 (H) 03/15/2019 1944   O2SAT 89.2 03/15/2019 1944    Vent Settings:    Condition -critically ill- comfort measures  Family Communication : Spoke with patient's son earlier this morning   Code Status :  DNR  Diet :  Diet Order    None  Disposition Plan  :  Remain hospitalized-suspect inpatient death in a few hours   Consults  :  None  Procedures  :  None  Antibiotics  :    Anti-infectives (From admission, onward)   Start     Dose/Rate Route Frequency Ordered Stop   03/17/19 0800  remdesivir 100 mg in sodium chloride 0.9 % 250 mL IVPB  Status:  Discontinued     100 mg 500 mL/hr over 30 Minutes Intravenous Every 24 hours 04/08/2019 0302 03/18/19 1005   03/29/2019 2200  cefTRIAXone (ROCEPHIN) 1 g in sodium chloride 0.9 % 100 mL IVPB  Status:  Discontinued     1 g 200 mL/hr over 30 Minutes Intravenous Every 24 hours 03/13/2019 0237 03/18/19 1005   03/30/2019 0400  remdesivir 200 mg in sodium chloride 0.9 % 250 mL IVPB     200 mg 500 mL/hr over 30 Minutes Intravenous Once 03/17/2019 0302 03/17/2019 0413      Inpatient Medications  Scheduled Meds: . glycopyrrolate  0.1 mg Intravenous TID   Continuous Infusions: . sodium chloride 250 mL (03/20/19 0943)  . HYDROmorphone 1 mg/hr (03/20/19 0700)   PRN Meds:.sodium chloride, acetaminophen **OR** acetaminophen, antiseptic oral rinse, atropine, diphenhydrAMINE, [DISCONTINUED] haloperidol **OR** [DISCONTINUED] haloperidol **OR** haloperidol lactate, HYDROmorphone, LORazepam **OR** LORazepam **OR** LORazepam, LORazepam, ondansetron **OR** ondansetron (ZOFRAN) IV   Time Spent in minutes 15  See all Orders  from today for further details   Jeoffrey MassedShanker Ghimire M.D on 03/20/2019 at 12:02 PM  To page go to [email or web address] - use universal password  [facility] -  Office  [phone]    Objective:   Vitals:   03/19/19 1937 03/19/19 2138 03/20/19 0501 03/20/19 0756  BP:  131/89 (!) 101/41 (!) 104/39  Pulse: 81 98 94 93  Resp:  12 (!) 9 12  Temp:    98 F (36.7 C)  TempSrc:    Axillary  SpO2: (!) 89% 93% 92% 90%  Weight:      Height:        Wt Readings from Last 3 Encounters:  03/20/2019 58.9 kg  03/15/19 59 kg  02/20/19 58.2 kg     Intake/Output Summary (Last 24 hours) at 03/20/2019 1202 Last data filed at 03/20/2019 0943 Gross per 24 hour  Intake 77.97 ml  Output 275 ml  Net -197.03 ml     Physical Exam Gen Exam: Unresponsive-appears comfortable and not in any distress   Data Review:    CBC Recent Labs  Lab 03/15/19 1944 03/28/2019 0550 03/17/19 0315 03/18/19 0320  WBC 9.9 5.5 13.4* 19.1*  HGB 12.4 12.7 14.1 15.6*  HCT 37.5 40.0 43.2 46.6*  PLT 167 138* 222 308  MCV 79.6* 83.0 79.9* 78.5*  MCH 26.3 26.3 26.1 26.3  MCHC 33.1 31.8 32.6 33.5  RDW 14.6 14.7 14.9 15.1  LYMPHSABS 0.9 0.6* 0.8 0.8  MONOABS 0.5 0.2 0.8 1.3*  EOSABS 0.0 0.0 0.0 0.0  BASOSABS 0.0 0.0 0.0 0.1    Chemistries  Recent Labs  Lab 03/15/19 1944 03/25/2019 0550 03/17/19 0315 03/18/19 0320  NA 132* 138 137 139  K 4.6 3.9 3.1* 3.1*  CL 98 101 101 105  CO2 23 24 23 23   GLUCOSE 122* 147* 122* 163*  BUN 32* 29* 35* 34*  CREATININE 1.22* 1.14* 1.10* 1.07*  CALCIUM 8.6* 8.3* 8.4* 8.4*  MG  --  2.3  --   --   AST 47* 44* 48* 50*  ALT 24 24 26 26   ALKPHOS 35*  36* 37* 40  BILITOT 0.7 0.4 0.6 0.4   ------------------------------------------------------------------------------------------------------------------ No results for input(s): CHOL, HDL, LDLCALC, TRIG, CHOLHDL, LDLDIRECT in the last 72 hours.  Lab Results  Component Value Date   HGBA1C 5.2 05/27/2015    ------------------------------------------------------------------------------------------------------------------ No results for input(s): TSH, T4TOTAL, T3FREE, THYROIDAB in the last 72 hours.  Invalid input(s): FREET3 ------------------------------------------------------------------------------------------------------------------ Recent Labs    03/18/19 0320  FERRITIN 1,482*    Coagulation profile Recent Labs  Lab 03/15/19 1944  INR 1.1    Recent Labs    03/18/19 0320  DDIMER 2.27*    Cardiac Enzymes No results for input(s): CKMB, TROPONINI, MYOGLOBIN in the last 168 hours.  Invalid input(s): CK ------------------------------------------------------------------------------------------------------------------    Component Value Date/Time   BNP 780.0 (H) 02/19/2019 1331    Micro Results Recent Results (from the past 240 hour(s))  SARS Coronavirus 2 [facility](Hospital order, [facility]) Nasopharyngeal Nasopharyngeal Swab     Status: Abnormal   Collection Time: 03/15/19  7:44 PM   Specimen: Nasopharyngeal Swab  Result Value Ref Range Status   SARS Coronavirus 2 POSITIVE (A) NEGATIVE Final    Comment: RESULT CALLED TO, READ BACK BY AND VERIFIED WITH: LYNN,R AT 2141 ON 03/15/2019 BY JPM (NOTE) If result is NEGATIVE SARS-CoV-2 target nucleic acids are NOT DETECTED. The SARS-CoV-2 RNA is generally detectable in upper and lower  respiratory specimens during the acute phase of infection. The lowest  concentration of SARS-CoV-2 viral copies this assay can detect is 250  copies / mL. A negative result does not preclude SARS-CoV-2 infection  and should not be used as the sole basis for treatment or other  patient management decisions.  A negative result may occur with  improper specimen collection / handling, submission of specimen other  than nasopharyngeal swab, presence of viral mutation(s) within the  areas targeted by this assay, and inadequate number  of viral copies  (<250 copies / mL). A negative result must be combined with clinical  observations, patient history, and epidemiological information. If result is POSITIVE SARS-CoV-2 target nucleic acids are DETECTED. The  SARS-CoV-2 RNA is generally detectable in upper and lower  respiratory specimens during the acute phase of infection.  Positive  results are indicative of active infection with SARS-CoV-2.  Clinical  correlation with patient history and other diagnostic information is  necessary to determine patient infection status.  Positive results do  not rule out bacterial infection or co-infection with other viruses. If result is PRESUMPTIVE POSTIVE SARS-CoV-2 nucleic acids MAY BE PRESENT.   A presumptive positive result was obtained on the submitted specimen  and confirmed on repeat testing.  While 2019 novel coronavirus  (SARS-CoV-2) nucleic acids may be present in the submitted sample  additional confirmatory testing may be necessary for epidemiological  and / or clinical management purposes  to differentiate between  SARS-CoV-2 and other Sarbecovirus currently known to infect humans.  If clinically indicated additional testing with an alternate test  methodology [phone] is a dvised. The SARS-CoV-2 RNA is generally  detectable in upper and lower respiratory specimens during the acute  phase of infection. The expected result is Negative. Fact Sheet for Patients:  BoilerBrush.com.cyhttps://www.fda.gov/media/136312/download Fact Sheet for Healthcare Providers: https://pope.com/https://www.fda.gov/media/136313/download This test is not yet approved or cleared by the [place] FDA and has been authorized for detection and/or diagnosis of SARS-CoV-2 by FDA under an Emergency Use Authorization (EUA).  This EUA will remain in effect (meaning this test can be used) for the duration of  the COVID-19 declaration under Section 564(b)(1) of the Act, 21 U.S.C. section 360bbb-3(b)(1), unless the authorization is  terminated or revoked sooner. Performed at [facility], [address] [place], [place] [address]   Blood culture (routine x 2)     Status: None   Collection Time: 03/15/19  7:44 PM   Specimen: BLOOD  Result Value Ref Range Status   Specimen Description BLOOD LEFT FOREARM  Final   Special Requests   Final    BOTTLES DRAWN AEROBIC AND ANAEROBIC Blood Culture results may not be optimal due to an excessive volume of blood received in culture bottles   Culture   Final    NO GROWTH 5 DAYS Performed at [facility], [address] [place], [place] [address]    Report Status 03/20/2019 FINAL  Final  Blood culture (routine x 2)     Status: None   Collection Time: 03/15/19  7:44 PM   Specimen: BLOOD  Result Value Ref Range Status   Specimen Description BLOOD RIGHT HAND  Final   Special Requests   Final    BOTTLES DRAWN AEROBIC AND ANAEROBIC Blood Culture adequate volume   Culture   Final    NO GROWTH 5 DAYS Performed at [facility], [address] [place], [place] [address]    Report Status 03/20/2019 FINAL  Final  Urine Culture     Status: Abnormal   Collection Time: 03/15/19  7:44 PM   Specimen: Urine, Random  Result Value Ref Range Status   Specimen Description   Final    URINE, RANDOM Performed at [facility], [address] [place], [place] [address]    Special Requests   Final    NONE Performed at [facility], [address] [place], [place] [address]    Culture >=100,000 COLONIES/mL KLEBSIELLA PNEUMONIAE (A)  Final   Report Status 03/18/2019 FINAL  Final   Organism ID, Bacteria KLEBSIELLA PNEUMONIAE (A)  Final      Susceptibility   Klebsiella pneumoniae - MIC*    AMPICILLIN RESISTANT Resistant     CEFAZOLIN <=4 SENSITIVE Sensitive     CEFTRIAXONE <=1 SENSITIVE Sensitive     CIPROFLOXACIN <=0.25 SENSITIVE Sensitive     GENTAMICIN <=1 SENSITIVE Sensitive     IMIPENEM <=0.25 SENSITIVE Sensitive      NITROFURANTOIN <=16 SENSITIVE Sensitive     TRIMETH/SULFA <=20 SENSITIVE Sensitive     AMPICILLIN/SULBACTAM 4 SENSITIVE Sensitive     PIP/TAZO <=4 SENSITIVE Sensitive     Extended ESBL NEGATIVE Sensitive     * >=100,000 COLONIES/mL KLEBSIELLA PNEUMONIAE    Radiology Reports Dg Chest 2 View  Result Date: 02/19/2019 CLINICAL DATA:  Shortness of breath EXAM: CHEST - 2 VIEW COMPARISON:  December 25, 2017 FINDINGS: The heart size remains enlarged. Aortic calcifications are noted. The patient is status post prior median sternotomy. There are chronic appearing airspace opacities at the lung bases bilaterally. There is no pneumothorax. There appear to be new small bilateral pleural effusions. There is no acute osseous abnormality. There is diffuse osteopenia involving the thoracic spine. There is height loss of several lower thoracic vertebral bodies. IMPRESSION: 1. Cardiomegaly with small bilateral pleural effusions. There is likely some mild vascular congestion. 2. Chronic lung changes at the lung bases bilaterally favored to represent scarring or atelectasis. Electronically Signed   By: Katherine Mantlehristopher  Green M.D.   On: 02/19/2019 14:30   Dg Abd 1 View  Result Date: 03/20/2019 CLINICAL DATA:  Abdominal distention  EXAM: ABDOMEN - 1 VIEW COMPARISON:  05/15/2015 FINDINGS: Gaseous distention of bowel in the lower abdomen and pelvis, likely sigmoid colon. Moderate gaseous distention of the cecum. No small bowel dilatation. Prior cholecystectomy. No organomegaly or free air. IMPRESSION: Mild gaseous distention seen within the right colon and sigmoid colon. No convincing evidence for bowel obstruction. Electronically Signed   By: Charlett Nose M.D.   On: 03/19/2019 20:19   Dg Chest Portable 1 View  Result Date: 03/15/2019 CLINICAL DATA:  Fever. Altered mental status. Exposure to COVID-19. EXAM: PORTABLE CHEST 1 VIEW COMPARISON:  02/19/2019 FINDINGS: Interval patchy airspace opacity throughout the right lung and lower  half of the left lung. Possible small left pleural effusion. Stable enlarged cardiac silhouette and post CABG changes. Diffuse osteopenia. IMPRESSION: 1. Bilateral pneumonia, greater on the right. 2. Possible small left pleural effusion. 3. Stable cardiomegaly. Electronically Signed   By: Beckie Salts M.D.   On: 03/15/2019 20:09

## 2019-03-21 DIAGNOSIS — Z515 Encounter for palliative care: Secondary | ICD-10-CM

## 2019-04-10 NOTE — Death Summary Note (Signed)
DEATH SUMMARY   Patient Details  Name: Anita Ward MRN: [id number] DOB: October 25, 1919  Admission/Discharge Information   Admit Date:  03-18-19  Date of Death: Date of Death: 03-23-2019  Time of Death: Time of Death: 1000  Length of Stay: 5  Referring Physician: Barbette Reichmann, MD   Reason(s) for Hospitalization  Acute hypoxemic respiratory failure secondary to COVID-19 pneumonia.  Diagnoses  Preliminary cause of death:  Secondary Diagnoses (including complications and co-morbidities):  Principal Problem: Acute hypoxic respiratory failure secondary to pneumonia due to COVID-19 virus Active Problems:   Acute lower UTI   CKD (chronic kidney disease) stage 3, GFR 30-59 ml/min (HCC)   HTN (hypertension)   Chronic diastolic CHF (congestive heart failure) (HCC)   Acute respiratory failure with hypoxia (HCC)   Acute metabolic encephalopathy   Dementia   Palliative care encounter   Brief Hospital Course (including significant findings, care, treatment, and services provided and events leading to death)   Patient is a 85 y.o. female with PMHx of dementia, HTN,CAD s/p CABG presented with acute hypoxemic respiratory failure in setting of COVID-19 pneumonia.  Patient was admitted and started on steroids and Remdesivir, unfortunately her hospital course was complicated by worsening hypoxemia and acute metabolic encephalopathy.  After extensive discussion with the patient's family-she was transitioned to full comfort measures-and started on a Dilaudid infusion.  She also developed clinical features consistent with terminal delirium during this hospital stay requiring initiation of benzodiazepines and Haldol.  Palliative care was briefly consulted for symptom management of her terminal delirium.  Patient expired on 2023-03-23 at 10 AM.  This MD attempted to call the patient's son-but was unsuccessful, subsequently I called patient's grandson-Tony and offered my condolences.   Pertinent Labs and  Studies  Significant Diagnostic Studies Dg Chest 2 View  Result Date: 02/19/2019 CLINICAL DATA:  Shortness of breath EXAM: CHEST - 2 VIEW COMPARISON:  December 25, 2017 FINDINGS: The heart size remains enlarged. Aortic calcifications are noted. The patient is status post prior median sternotomy. There are chronic appearing airspace opacities at the lung bases bilaterally. There is no pneumothorax. There appear to be new small bilateral pleural effusions. There is no acute osseous abnormality. There is diffuse osteopenia involving the thoracic spine. There is height loss of several lower thoracic vertebral bodies. IMPRESSION: 1. Cardiomegaly with small bilateral pleural effusions. There is likely some mild vascular congestion. 2. Chronic lung changes at the lung bases bilaterally favored to represent scarring or atelectasis. Electronically Signed   By: Katherine Mantle M.D.   On: 02/19/2019 14:30   Dg Abd 1 View  Result Date: 03-18-19 CLINICAL DATA:  Abdominal distention EXAM: ABDOMEN - 1 VIEW COMPARISON:  05/15/2015 FINDINGS: Gaseous distention of bowel in the lower abdomen and pelvis, likely sigmoid colon. Moderate gaseous distention of the cecum. No small bowel dilatation. Prior cholecystectomy. No organomegaly or free air. IMPRESSION: Mild gaseous distention seen within the right colon and sigmoid colon. No convincing evidence for bowel obstruction. Electronically Signed   By: Charlett Nose M.D.   On: March 18, 2019 20:19   Dg Chest Portable 1 View  Result Date: 03/15/2019 CLINICAL DATA:  Fever. Altered mental status. Exposure to COVID-19. EXAM: PORTABLE CHEST 1 VIEW COMPARISON:  02/19/2019 FINDINGS: Interval patchy airspace opacity throughout the right lung and lower half of the left lung. Possible small left pleural effusion. Stable enlarged cardiac silhouette and post CABG changes. Diffuse osteopenia. IMPRESSION: 1. Bilateral pneumonia, greater on the right. 2. Possible small left pleural effusion. 3.  Stable cardiomegaly. Electronically Signed   By: Claudie Revering M.D.   On: 03/15/2019 20:09    Microbiology Recent Results (from the past 240 hour(s))  SARS Coronavirus 2 [facility] order, Performed in [facility] hospital lab) Nasopharyngeal Nasopharyngeal Swab     Status: Abnormal   Collection Time: 03/15/19  7:44 PM   Specimen: Nasopharyngeal Swab  Result Value Ref Range Status   SARS Coronavirus 2 POSITIVE (A) NEGATIVE Final    Comment: RESULT CALLED TO, READ BACK BY AND VERIFIED WITH: LYNN,R AT 2141 ON 03/15/2019 BY JPM (NOTE) If result is NEGATIVE SARS-CoV-2 target nucleic acids are NOT DETECTED. The SARS-CoV-2 RNA is generally detectable in upper and lower  respiratory specimens during the acute phase of infection. The lowest  concentration of SARS-CoV-2 viral copies this assay can detect is 250  copies / mL. A negative result does not preclude SARS-CoV-2 infection  and should not be used as the sole basis for treatment or other  patient management decisions.  A negative result may occur with  improper specimen collection / handling, submission of specimen other  than nasopharyngeal swab, presence of viral mutation(s) within the  areas targeted by this assay, and inadequate number of viral copies  (<250 copies / mL). A negative result must be combined with clinical  observations, patient history, and epidemiological information. If result is POSITIVE SARS-CoV-2 target nucleic acids are DETECTED. The  SARS-CoV-2 RNA is generally detectable in upper and lower  respiratory specimens during the acute phase of infection.  Positive  results are indicative of active infection with SARS-CoV-2.  Clinical  correlation with patient history and other diagnostic information is  necessary to determine patient infection status.  Positive results do  not rule out bacterial infection or co-infection with other viruses. If result is PRESUMPTIVE POSTIVE SARS-CoV-2 nucleic acids MAY BE PRESENT.    A presumptive positive result was obtained on the submitted specimen  and confirmed on repeat testing.  While 2019 novel coronavirus  (SARS-CoV-2) nucleic acids may be present in the submitted sample  additional confirmatory testing may be necessary for epidemiological  and / or clinical management purposes  to differentiate between  SARS-CoV-2 and other Sarbecovirus currently known to infect humans.  If clinically indicated additional testing with an alternate test  methodology [phone]) is a dvised. The SARS-CoV-2 RNA is generally  detectable in upper and lower respiratory specimens during the acute  phase of infection. The expected result is Negative. Fact Sheet for Patients:  StrictlyIdeas.no Fact Sheet for Healthcare Providers: BankingDealers.co.za This test is not yet approved or cleared by the [place] FDA and has been authorized for detection and/or diagnosis of SARS-CoV-2 by FDA under an Emergency Use Authorization (EUA).  This EUA will remain in effect (meaning this test can be used) for the duration of the COVID-19 declaration under Section 564(b)(1) of the Act, 21 U.S.C. section 360bbb-3(b)(1), unless the authorization is terminated or revoked sooner. Performed at [facility], [address] [place], [place] [address]   Blood culture (routine x 2)     Status: None   Collection Time: 03/15/19  7:44 PM   Specimen: BLOOD  Result Value Ref Range Status   Specimen Description BLOOD LEFT FOREARM  Final   Special Requests   Final    BOTTLES DRAWN AEROBIC AND ANAEROBIC Blood Culture results may not be optimal due to an excessive volume of blood received in culture bottles   Culture   Final    NO GROWTH 5  DAYS Performed at [facility], [address] [place], [place] [address]    Report Status 03/20/2019 FINAL  Final  Blood culture (routine x 2)     Status: None   Collection Time: 03/15/19  7:44 PM    Specimen: BLOOD  Result Value Ref Range Status   Specimen Description BLOOD RIGHT HAND  Final   Special Requests   Final    BOTTLES DRAWN AEROBIC AND ANAEROBIC Blood Culture adequate volume   Culture   Final    NO GROWTH 5 DAYS Performed at [facility], [address] [place], [place] [address]    Report Status 03/20/2019 FINAL  Final  Urine Culture     Status: Abnormal   Collection Time: 03/15/19  7:44 PM   Specimen: Urine, Random  Result Value Ref Range Status   Specimen Description   Final    URINE, RANDOM Performed at [facility], [address] [place], [place] [address]    Special Requests   Final    NONE Performed at [facility], [address] [place], [place] [address]    Culture >=100,000 COLONIES/mL KLEBSIELLA PNEUMONIAE (A)  Final   Report Status 03/18/2019 FINAL  Final   Organism ID, Bacteria KLEBSIELLA PNEUMONIAE (A)  Final      Susceptibility   Klebsiella pneumoniae - MIC*    AMPICILLIN RESISTANT Resistant     CEFAZOLIN <=4 SENSITIVE Sensitive     CEFTRIAXONE <=1 SENSITIVE Sensitive     CIPROFLOXACIN <=0.25 SENSITIVE Sensitive     GENTAMICIN <=1 SENSITIVE Sensitive     IMIPENEM <=0.25 SENSITIVE Sensitive     NITROFURANTOIN <=16 SENSITIVE Sensitive     TRIMETH/SULFA <=20 SENSITIVE Sensitive     AMPICILLIN/SULBACTAM 4 SENSITIVE Sensitive     PIP/TAZO <=4 SENSITIVE Sensitive     Extended ESBL NEGATIVE Sensitive     * >=100,000 COLONIES/mL KLEBSIELLA PNEUMONIAE    Lab Basic Metabolic Panel: Recent Labs  Lab 03/15/19 1944 03/22/2019 0550 03/17/19 0315 03/18/19 0320  NA 132* 138 137 139  K 4.6 3.9 3.1* 3.1*  CL 98 101 101 105  CO2 23 24 23 23   GLUCOSE 122* 147* 122* 163*  BUN 32* 29* 35* 34*  CREATININE 1.22* 1.14* 1.10* 1.07*  CALCIUM 8.6* 8.3* 8.4* 8.4*  MG  --  2.3  --   --    Liver Function Tests: Recent Labs  Lab 03/15/19 1944 03/28/2019 0550 03/17/19 0315 03/18/19 0320  AST 47* 44* 48* 50*  ALT 24 24  26 26   ALKPHOS 35* 36* 37* 40  BILITOT 0.7 0.4 0.6 0.4  PROT 7.3 6.9 7.5 7.2  ALBUMIN 3.3* 2.8* 3.0* 2.9*   No results for input(s): LIPASE, AMYLASE in the last 168 hours. No results for input(s): AMMONIA in the last 168 hours. CBC: Recent Labs  Lab 03/15/19 1944 04/09/2019 0550 03/17/19 0315 03/18/19 0320  WBC 9.9 5.5 13.4* 19.1*  NEUTROABS 8.5* 4.7 11.7* 16.9*  HGB 12.4 12.7 14.1 15.6*  HCT 37.5 40.0 43.2 46.6*  MCV 79.6* 83.0 79.9* 78.5*  PLT 167 138* 222 308   Cardiac Enzymes: No results for input(s): CKTOTAL, CKMB, CKMBINDEX, TROPONINI in the last 168 hours. Sepsis Labs: Recent Labs  Lab 03/15/19 1944 03/15/19 2130 04/01/2019 0550 03/17/19 0315 03/18/19 0320  PROCALCITON  --   --  0.20 0.72 0.61  WBC 9.9  --  5.5 13.4* 19.1*  LATICACIDVEN 1.3 1.4  --   --   --  Procedures/Operations       01-05-2019, 11:29 AM

## 2019-04-10 NOTE — Care Management (Signed)
11:43am Case manager entered patient's chart, patient has expired. May God rest her soul.   Ricki Miller, RN BSN Case Manager [phone]

## 2019-04-10 NOTE — Progress Notes (Addendum)
[id number]  Resting comfortably at this time.    1000  Respirations have ceased per auscultation.  Heartbeat has ceased per auscultation.  Charge nurse aware.  MD made aware.  Family, son Patrick Jupiter, notified per md and this nurse.  Per [place], funeral home preference is [employer] and Environmental manager in [employer].  [place] [facility] notified.  Ref # [id number] per Elmyra Ricks.  1100  Post mortem care completed with assist of NT.  Transported via Biomedical scientist to morgue.  1112  Dilaudid 57.1 mg wasted with Neldon Mc, RN

## 2019-04-10 DEATH — deceased

## 2021-03-28 IMAGING — CR CHEST - 2 VIEW
2 series · 2 of 2 positions shown · non-contrast
Comparison: December 25, 2017

CLINICAL DATA: Shortness of breath

EXAM:
CHEST - 2 VIEW

[chest lat]
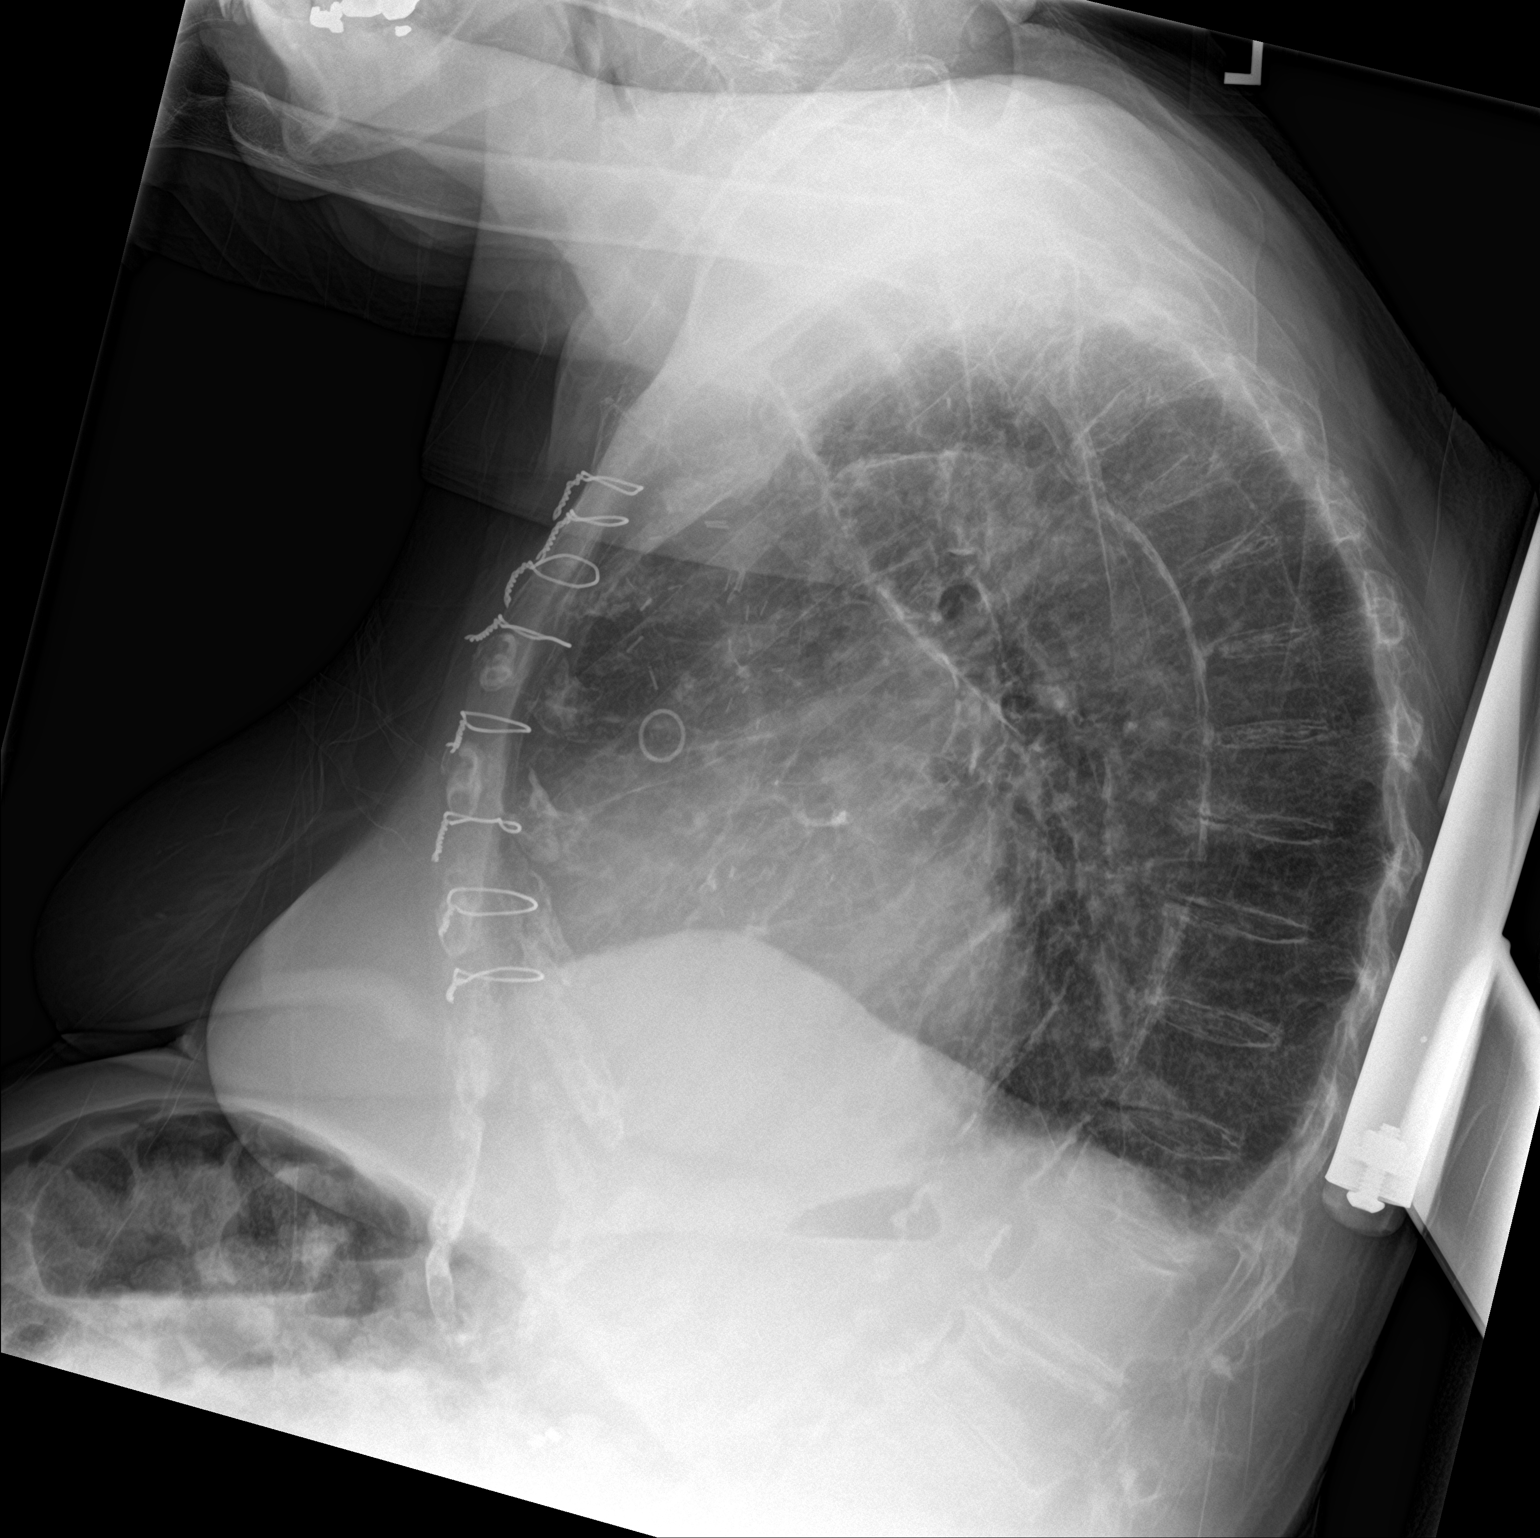

[chest ap]
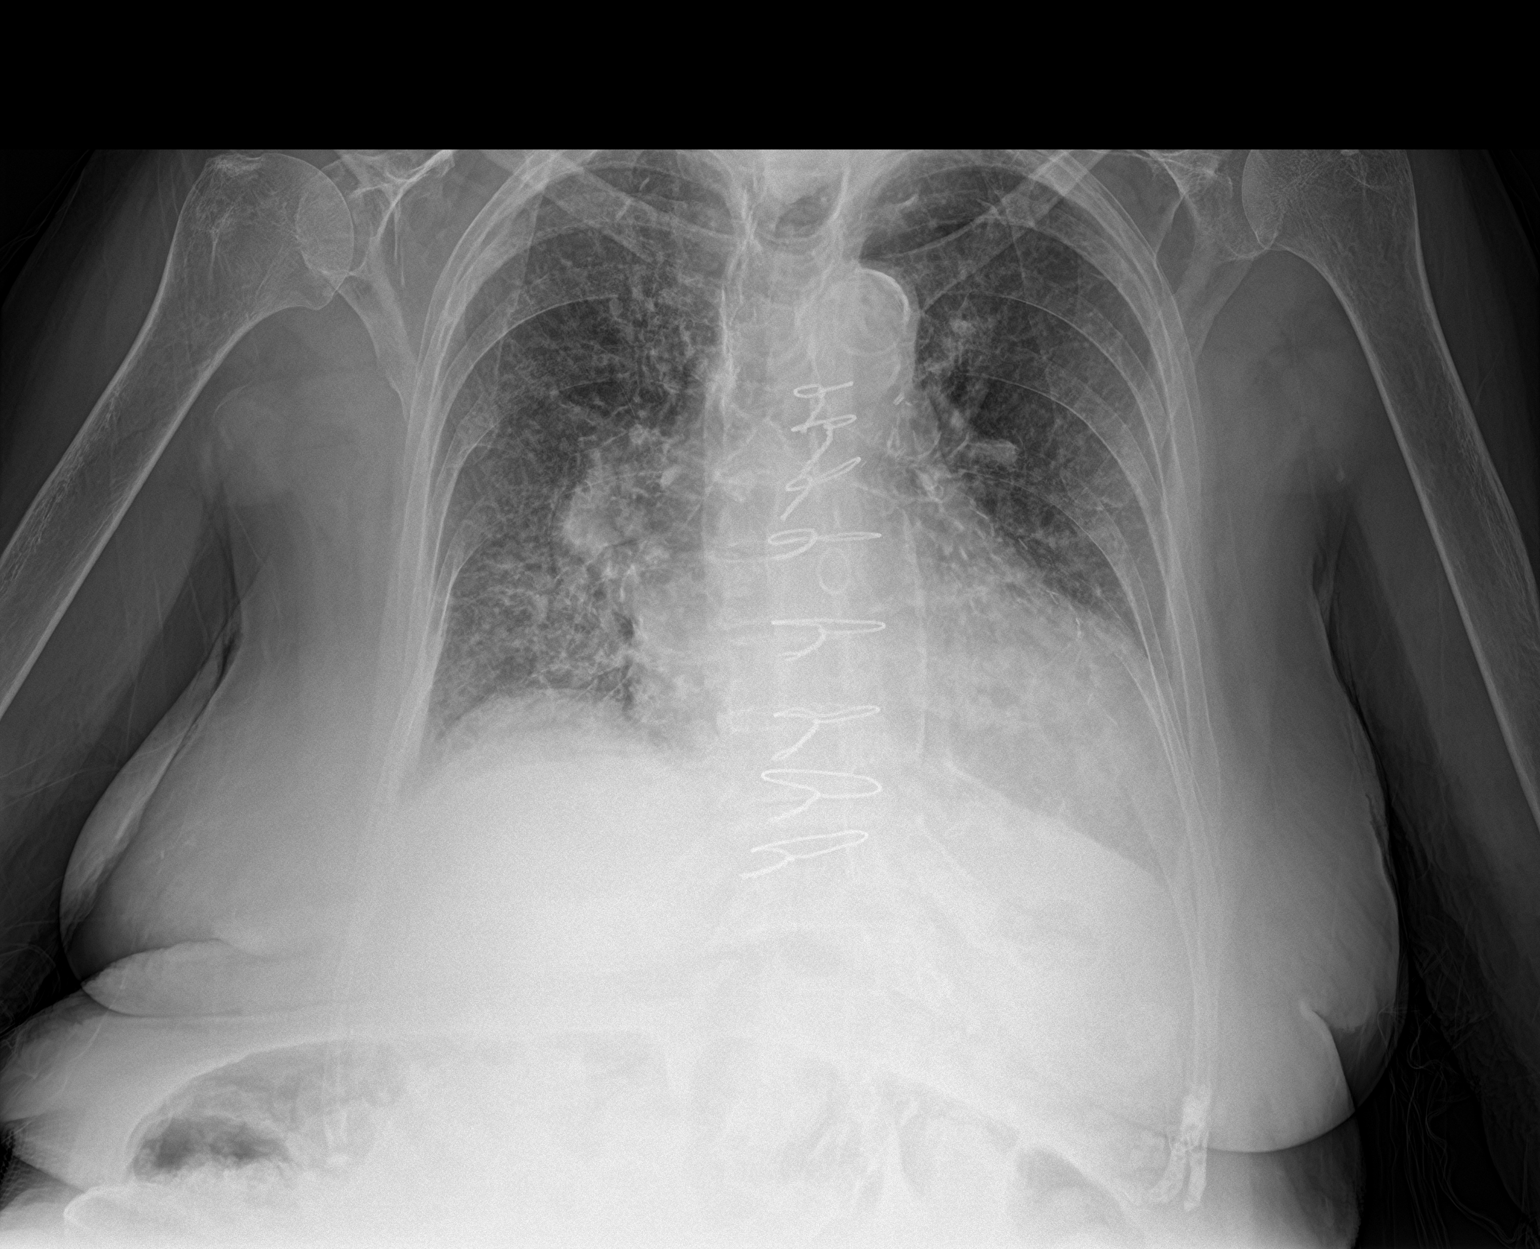

[2 of 2 positions shown; findings below may reference images not displayed]

FINDINGS: The heart size remains enlarged. Aortic calcifications are noted.
The patient is status post prior median sternotomy. There are
chronic appearing airspace opacities at the lung bases bilaterally.
There is no pneumothorax. There appear to be new small bilateral
pleural effusions. There is no acute osseous abnormality. There is
diffuse osteopenia involving the thoracic spine. There is height
loss of several lower thoracic vertebral bodies.
IMPRESSION: 1. Cardiomegaly with small bilateral pleural effusions. There is
likely some mild vascular congestion.
2. Chronic lung changes at the lung bases bilaterally favored to
represent scarring or atelectasis.

## 2021-04-21 IMAGING — DX DG CHEST 1V PORT
1 series · 1 of 1 positions shown · non-contrast
Comparison: 02/19/2019

CLINICAL DATA: Fever. Altered mental status. Exposure to [id number].

EXAM:
PORTABLE CHEST 1 VIEW

[chest ap]
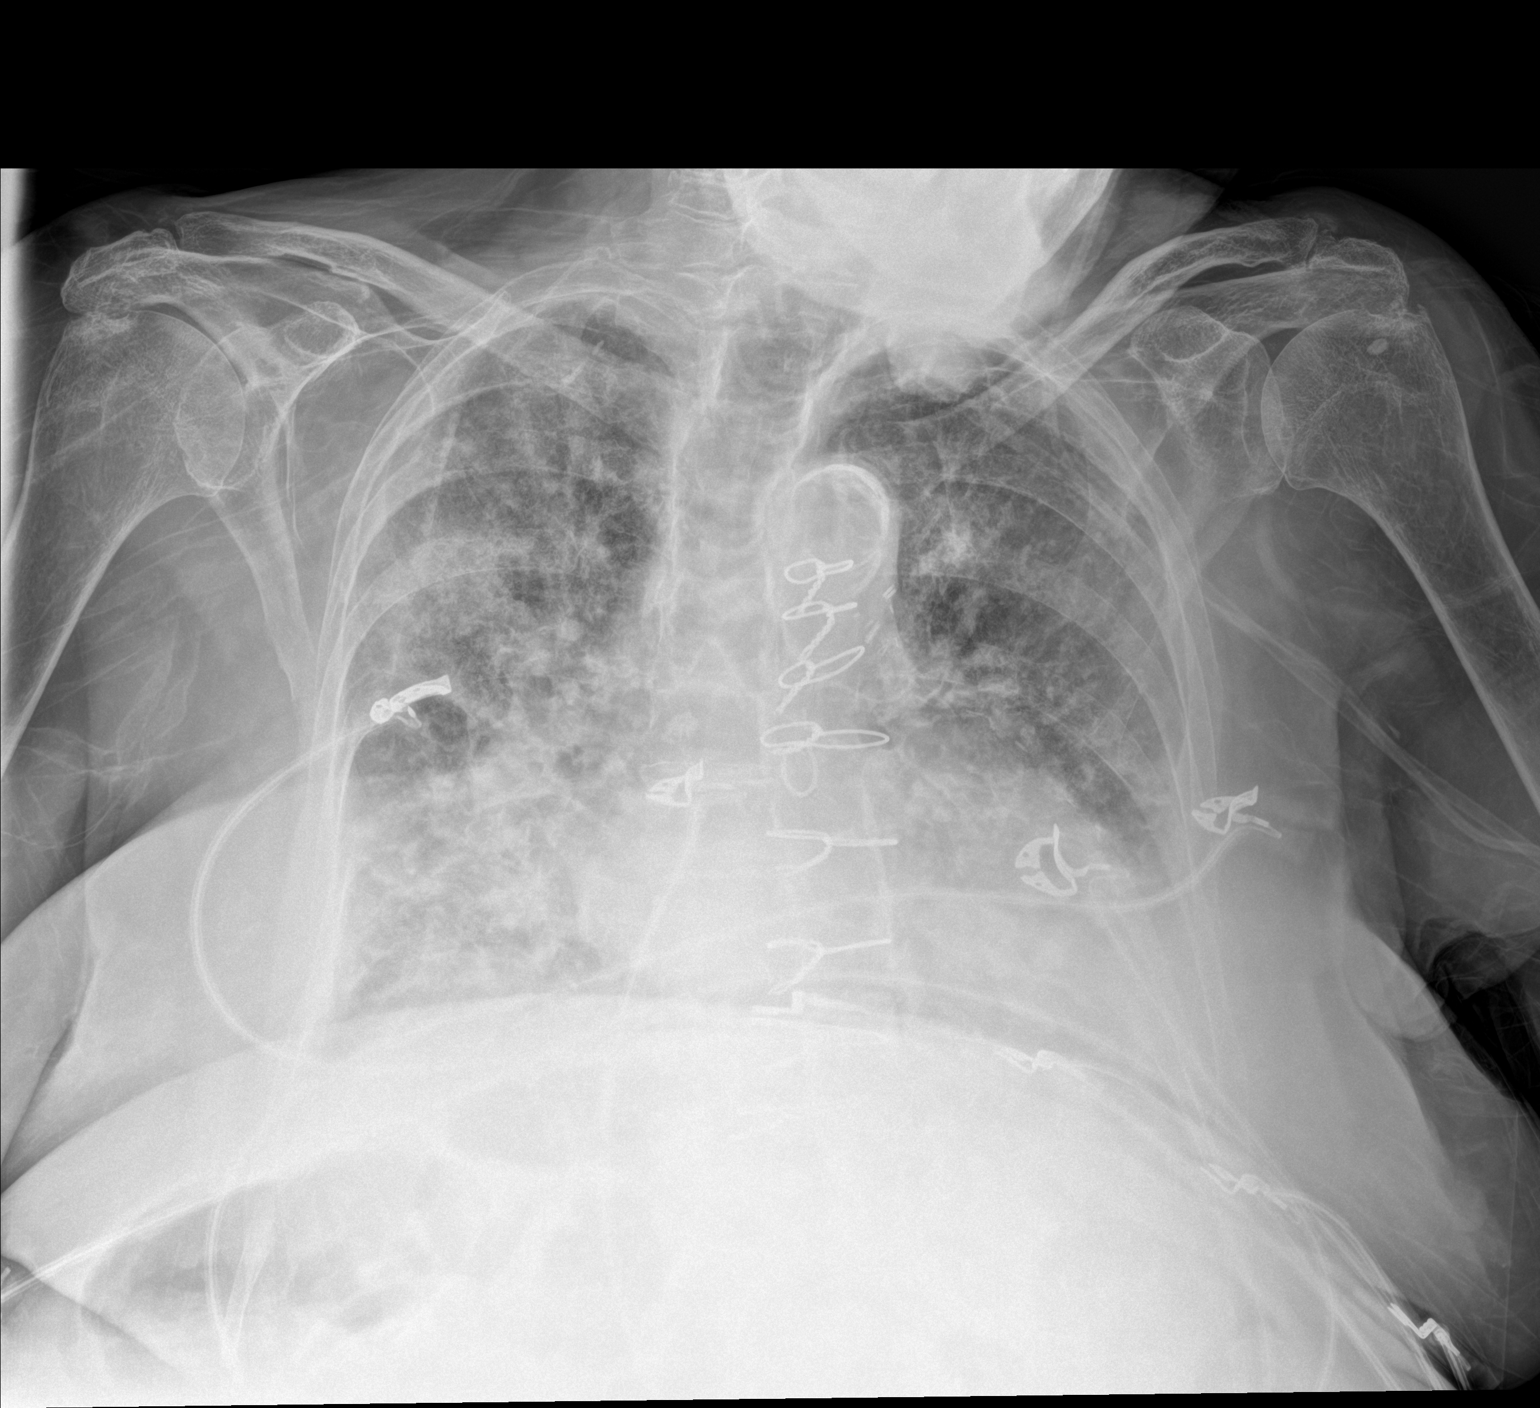

[1 of 1 positions shown; findings below may reference images not displayed]

FINDINGS: Interval patchy airspace opacity throughout the right lung and lower
half of the left lung. Possible small left pleural effusion. Stable
enlarged cardiac silhouette and post CABG changes. Diffuse
osteopenia.
IMPRESSION: 1. Bilateral pneumonia, greater on the right.
2. Possible small left pleural effusion.
3. Stable cardiomegaly.
